# Patient Record
Sex: Female | Born: 1935 | Race: White | Hispanic: No | Smoking: Never smoker
Health system: Southern US, Community
[De-identification: ages and names within clinical notes are randomized; demographics above are authoritative.]

## PROBLEM LIST (undated history)

## (undated) DIAGNOSIS — F32A Depression, unspecified: Secondary | ICD-10-CM

## (undated) DIAGNOSIS — C55 Malignant neoplasm of uterus, part unspecified: Secondary | ICD-10-CM

## (undated) DIAGNOSIS — R197 Diarrhea, unspecified: Secondary | ICD-10-CM

## (undated) DIAGNOSIS — N183 Chronic kidney disease, stage 3 unspecified: Secondary | ICD-10-CM

## (undated) DIAGNOSIS — E669 Obesity, unspecified: Secondary | ICD-10-CM

## (undated) DIAGNOSIS — I10 Essential (primary) hypertension: Secondary | ICD-10-CM

## (undated) DIAGNOSIS — I639 Cerebral infarction, unspecified: Secondary | ICD-10-CM

## (undated) DIAGNOSIS — F329 Major depressive disorder, single episode, unspecified: Secondary | ICD-10-CM

## (undated) DIAGNOSIS — K222 Esophageal obstruction: Secondary | ICD-10-CM

## (undated) DIAGNOSIS — K219 Gastro-esophageal reflux disease without esophagitis: Secondary | ICD-10-CM

## (undated) DIAGNOSIS — R011 Cardiac murmur, unspecified: Secondary | ICD-10-CM

## (undated) DIAGNOSIS — Z90710 Acquired absence of both cervix and uterus: Secondary | ICD-10-CM

## (undated) DIAGNOSIS — E039 Hypothyroidism, unspecified: Secondary | ICD-10-CM

## (undated) DIAGNOSIS — K2970 Gastritis, unspecified, without bleeding: Secondary | ICD-10-CM

## (undated) DIAGNOSIS — E785 Hyperlipidemia, unspecified: Secondary | ICD-10-CM

## (undated) DIAGNOSIS — I2510 Atherosclerotic heart disease of native coronary artery without angina pectoris: Secondary | ICD-10-CM

## (undated) HISTORY — DX: Hyperlipidemia, unspecified: E78.5

## (undated) HISTORY — DX: Cardiac murmur, unspecified: R01.1

## (undated) HISTORY — DX: Hypothyroidism, unspecified: E03.9

## (undated) HISTORY — DX: Diarrhea, unspecified: R19.7

## (undated) HISTORY — PX: CERVICAL DISC SURGERY: SHX588

## (undated) HISTORY — DX: Obesity, unspecified: E66.9

## (undated) HISTORY — DX: Esophageal obstruction: K22.2

## (undated) HISTORY — DX: Acquired absence of both cervix and uterus: Z90.710

## (undated) HISTORY — DX: Depression, unspecified: F32.A

## (undated) HISTORY — DX: Atherosclerotic heart disease of native coronary artery without angina pectoris: I25.10

## (undated) HISTORY — DX: Malignant neoplasm of uterus, part unspecified: C55

## (undated) HISTORY — DX: Essential (primary) hypertension: I10

## (undated) HISTORY — DX: Cerebral infarction, unspecified: I63.9

## (undated) HISTORY — PX: TOTAL ABDOMINAL HYSTERECTOMY: SHX209

## (undated) HISTORY — DX: Gastro-esophageal reflux disease without esophagitis: K21.9

## (undated) HISTORY — DX: Major depressive disorder, single episode, unspecified: F32.9

## (undated) HISTORY — PX: CHOLECYSTECTOMY: SHX55

## (undated) HISTORY — DX: Gastritis, unspecified, without bleeding: K29.70

## (undated) HISTORY — PX: BLADDER SURGERY: SHX569

---

## 1997-06-30 ENCOUNTER — Encounter: Payer: Self-pay | Admitting: Gastroenterology

## 1998-03-26 ENCOUNTER — Other Ambulatory Visit: Admission: RE | Admit: 1998-03-26 | Discharge: 1998-03-26 | Payer: Self-pay | Admitting: *Deleted

## 1998-05-18 ENCOUNTER — Ambulatory Visit (HOSPITAL_COMMUNITY): Admission: RE | Admit: 1998-05-18 | Discharge: 1998-05-18 | Payer: Self-pay | Admitting: *Deleted

## 1998-11-29 ENCOUNTER — Other Ambulatory Visit: Admission: RE | Admit: 1998-11-29 | Discharge: 1998-11-29 | Payer: Self-pay | Admitting: *Deleted

## 1999-01-20 ENCOUNTER — Observation Stay (HOSPITAL_COMMUNITY): Admission: RE | Admit: 1999-01-20 | Discharge: 1999-01-21 | Payer: Self-pay | Admitting: Urology

## 1999-03-31 ENCOUNTER — Other Ambulatory Visit: Admission: RE | Admit: 1999-03-31 | Discharge: 1999-03-31 | Payer: Self-pay | Admitting: *Deleted

## 1999-06-02 ENCOUNTER — Ambulatory Visit (HOSPITAL_COMMUNITY): Admission: RE | Admit: 1999-06-02 | Discharge: 1999-06-02 | Payer: Self-pay | Admitting: *Deleted

## 1999-06-06 ENCOUNTER — Encounter: Admission: RE | Admit: 1999-06-06 | Discharge: 1999-06-06 | Payer: Self-pay | Admitting: *Deleted

## 1999-10-20 ENCOUNTER — Other Ambulatory Visit: Admission: RE | Admit: 1999-10-20 | Discharge: 1999-10-20 | Payer: Self-pay | Admitting: *Deleted

## 2000-04-04 ENCOUNTER — Ambulatory Visit (HOSPITAL_COMMUNITY): Admission: RE | Admit: 2000-04-04 | Discharge: 2000-04-04 | Payer: Self-pay | Admitting: Ophthalmology

## 2000-05-24 ENCOUNTER — Other Ambulatory Visit: Admission: RE | Admit: 2000-05-24 | Discharge: 2000-05-24 | Payer: Self-pay | Admitting: *Deleted

## 2000-06-05 ENCOUNTER — Encounter: Admission: RE | Admit: 2000-06-05 | Discharge: 2000-06-05 | Payer: Self-pay | Admitting: *Deleted

## 2000-11-30 ENCOUNTER — Other Ambulatory Visit: Admission: RE | Admit: 2000-11-30 | Discharge: 2000-11-30 | Payer: Self-pay | Admitting: *Deleted

## 2001-05-23 ENCOUNTER — Inpatient Hospital Stay (HOSPITAL_COMMUNITY): Admission: EM | Admit: 2001-05-23 | Discharge: 2001-05-29 | Payer: Self-pay | Admitting: Emergency Medicine

## 2001-05-23 ENCOUNTER — Encounter: Payer: Self-pay | Admitting: Emergency Medicine

## 2001-05-26 ENCOUNTER — Encounter: Payer: Self-pay | Admitting: Cardiology

## 2001-05-27 ENCOUNTER — Encounter: Payer: Self-pay | Admitting: Cardiology

## 2001-07-01 ENCOUNTER — Encounter: Admission: RE | Admit: 2001-07-01 | Discharge: 2001-09-29 | Payer: Self-pay | Admitting: Cardiology

## 2001-07-02 ENCOUNTER — Encounter: Admission: RE | Admit: 2001-07-02 | Discharge: 2001-07-02 | Payer: Self-pay | Admitting: *Deleted

## 2001-07-02 ENCOUNTER — Other Ambulatory Visit: Admission: RE | Admit: 2001-07-02 | Discharge: 2001-07-02 | Payer: Self-pay | Admitting: *Deleted

## 2001-07-24 ENCOUNTER — Ambulatory Visit (HOSPITAL_COMMUNITY): Admission: RE | Admit: 2001-07-24 | Discharge: 2001-07-24 | Payer: Self-pay | Admitting: Gastroenterology

## 2001-08-30 ENCOUNTER — Encounter: Admission: RE | Admit: 2001-08-30 | Discharge: 2001-08-30 | Payer: Self-pay | Admitting: Gastroenterology

## 2001-08-30 ENCOUNTER — Encounter: Payer: Self-pay | Admitting: Gastroenterology

## 2001-11-14 ENCOUNTER — Encounter: Admission: RE | Admit: 2001-11-14 | Discharge: 2002-02-12 | Payer: Self-pay | Admitting: Cardiology

## 2002-01-21 ENCOUNTER — Other Ambulatory Visit: Admission: RE | Admit: 2002-01-21 | Discharge: 2002-01-21 | Payer: Self-pay | Admitting: *Deleted

## 2002-07-09 ENCOUNTER — Encounter: Admission: RE | Admit: 2002-07-09 | Discharge: 2002-07-09 | Payer: Self-pay | Admitting: *Deleted

## 2003-07-29 ENCOUNTER — Encounter: Admission: RE | Admit: 2003-07-29 | Discharge: 2003-07-29 | Payer: Self-pay | Admitting: *Deleted

## 2003-10-06 ENCOUNTER — Inpatient Hospital Stay (HOSPITAL_BASED_OUTPATIENT_CLINIC_OR_DEPARTMENT_OTHER): Admission: RE | Admit: 2003-10-06 | Discharge: 2003-10-06 | Payer: Self-pay | Admitting: *Deleted

## 2003-10-22 ENCOUNTER — Other Ambulatory Visit: Admission: RE | Admit: 2003-10-22 | Discharge: 2003-10-22 | Payer: Self-pay | Admitting: *Deleted

## 2004-06-23 ENCOUNTER — Encounter: Admission: RE | Admit: 2004-06-23 | Discharge: 2004-06-23 | Payer: Self-pay | Admitting: Internal Medicine

## 2005-06-16 ENCOUNTER — Ambulatory Visit: Payer: Self-pay | Admitting: Cardiology

## 2005-10-09 ENCOUNTER — Ambulatory Visit (HOSPITAL_COMMUNITY): Admission: RE | Admit: 2005-10-09 | Discharge: 2005-10-09 | Payer: Self-pay | Admitting: Internal Medicine

## 2006-04-24 ENCOUNTER — Ambulatory Visit: Payer: Self-pay | Admitting: Cardiovascular Disease

## 2006-04-26 ENCOUNTER — Ambulatory Visit: Payer: Self-pay

## 2007-05-24 ENCOUNTER — Ambulatory Visit: Payer: Self-pay

## 2007-08-06 ENCOUNTER — Ambulatory Visit: Payer: Self-pay | Admitting: Cardiovascular Disease

## 2008-06-04 ENCOUNTER — Ambulatory Visit: Payer: Self-pay | Admitting: Cardiovascular Disease

## 2008-06-09 ENCOUNTER — Ambulatory Visit (HOSPITAL_COMMUNITY): Admission: RE | Admit: 2008-06-09 | Discharge: 2008-06-09 | Payer: Self-pay | Admitting: Internal Medicine

## 2008-08-06 ENCOUNTER — Ambulatory Visit: Payer: Self-pay | Admitting: Cardiovascular Disease

## 2009-05-03 ENCOUNTER — Ambulatory Visit: Payer: Self-pay | Admitting: Gastroenterology

## 2009-05-03 ENCOUNTER — Encounter (INDEPENDENT_AMBULATORY_CARE_PROVIDER_SITE_OTHER): Payer: Self-pay | Admitting: *Deleted

## 2009-05-03 DIAGNOSIS — I6350 Cerebral infarction due to unspecified occlusion or stenosis of unspecified cerebral artery: Secondary | ICD-10-CM | POA: Insufficient documentation

## 2009-05-03 DIAGNOSIS — R1310 Dysphagia, unspecified: Secondary | ICD-10-CM | POA: Insufficient documentation

## 2009-05-03 DIAGNOSIS — K219 Gastro-esophageal reflux disease without esophagitis: Secondary | ICD-10-CM | POA: Insufficient documentation

## 2009-05-03 DIAGNOSIS — I2510 Atherosclerotic heart disease of native coronary artery without angina pectoris: Secondary | ICD-10-CM

## 2009-05-24 ENCOUNTER — Ambulatory Visit: Payer: Self-pay | Admitting: Gastroenterology

## 2009-05-24 ENCOUNTER — Encounter: Payer: Self-pay | Admitting: Gastroenterology

## 2009-05-26 ENCOUNTER — Encounter: Payer: Self-pay | Admitting: Gastroenterology

## 2009-05-27 ENCOUNTER — Encounter: Payer: Self-pay | Admitting: Gastroenterology

## 2009-06-21 ENCOUNTER — Ambulatory Visit: Payer: Self-pay | Admitting: Gastroenterology

## 2009-07-05 ENCOUNTER — Telehealth: Payer: Self-pay | Admitting: Gastroenterology

## 2009-07-06 ENCOUNTER — Ambulatory Visit: Payer: Self-pay | Admitting: Gastroenterology

## 2009-07-06 DIAGNOSIS — R112 Nausea with vomiting, unspecified: Secondary | ICD-10-CM | POA: Insufficient documentation

## 2009-07-06 DIAGNOSIS — R110 Nausea: Secondary | ICD-10-CM

## 2009-07-06 DIAGNOSIS — K2970 Gastritis, unspecified, without bleeding: Secondary | ICD-10-CM | POA: Insufficient documentation

## 2009-07-06 DIAGNOSIS — R197 Diarrhea, unspecified: Secondary | ICD-10-CM | POA: Insufficient documentation

## 2009-07-06 DIAGNOSIS — K2990 Gastroduodenitis, unspecified, without bleeding: Secondary | ICD-10-CM

## 2009-07-06 DIAGNOSIS — R1084 Generalized abdominal pain: Secondary | ICD-10-CM | POA: Insufficient documentation

## 2009-07-06 DIAGNOSIS — I10 Essential (primary) hypertension: Secondary | ICD-10-CM

## 2009-07-06 DIAGNOSIS — E039 Hypothyroidism, unspecified: Secondary | ICD-10-CM | POA: Insufficient documentation

## 2009-07-06 DIAGNOSIS — K222 Esophageal obstruction: Secondary | ICD-10-CM | POA: Insufficient documentation

## 2009-07-08 LAB — CONVERTED CEMR LAB
BUN: 15 mg/dL (ref 6–23)
Basophils Absolute: 0 10*3/uL (ref 0.0–0.1)
Basophils Relative: 0.4 % (ref 0.0–3.0)
CO2: 26 meq/L (ref 19–32)
Calcium: 8.5 mg/dL (ref 8.4–10.5)
Chloride: 103 meq/L (ref 96–112)
Creatinine, Ser: 1.1 mg/dL (ref 0.4–1.2)
Eosinophils Absolute: 0.3 10*3/uL (ref 0.0–0.7)
Eosinophils Relative: 2.3 % (ref 0.0–5.0)
GFR calc non Af Amer: 50.88 mL/min (ref >60)
Glucose, Bld: 234 mg/dL — ABNORMAL HIGH (ref 70–99)
HCT: 33.5 % — ABNORMAL LOW (ref 36.0–46.0)
Hemoglobin: 11.2 g/dL — ABNORMAL LOW (ref 12.0–15.0)
Lymphocytes Relative: 22.2 % (ref 12.0–46.0)
Lymphs Abs: 2.4 10*3/uL (ref 0.7–4.0)
MCHC: 33.5 g/dL (ref 30.0–36.0)
MCV: 90.8 fL (ref 78.0–100.0)
Monocytes Absolute: 1 10*3/uL (ref 0.1–1.0)
Monocytes Relative: 9.4 % (ref 3.0–12.0)
Neutro Abs: 7.3 10*3/uL (ref 1.4–7.7)
Neutrophils Relative %: 65.7 % (ref 43.0–77.0)
Platelets: 292 10*3/uL (ref 150.0–400.0)
Potassium: 3.6 meq/L (ref 3.5–5.1)
RBC: 3.69 M/uL — ABNORMAL LOW (ref 3.87–5.11)
RDW: 14.3 % (ref 11.5–14.6)
Sodium: 139 meq/L (ref 135–145)
WBC: 11 10*3/uL — ABNORMAL HIGH (ref 4.5–10.5)

## 2009-07-28 DIAGNOSIS — E669 Obesity, unspecified: Secondary | ICD-10-CM | POA: Insufficient documentation

## 2009-07-28 DIAGNOSIS — F329 Major depressive disorder, single episode, unspecified: Secondary | ICD-10-CM | POA: Insufficient documentation

## 2009-07-28 DIAGNOSIS — E119 Type 2 diabetes mellitus without complications: Secondary | ICD-10-CM | POA: Insufficient documentation

## 2009-07-28 DIAGNOSIS — Z8542 Personal history of malignant neoplasm of other parts of uterus: Secondary | ICD-10-CM

## 2009-07-28 DIAGNOSIS — E785 Hyperlipidemia, unspecified: Secondary | ICD-10-CM | POA: Insufficient documentation

## 2009-08-03 ENCOUNTER — Ambulatory Visit: Payer: Self-pay | Admitting: Cardiovascular Disease

## 2009-10-07 ENCOUNTER — Emergency Department (HOSPITAL_COMMUNITY): Admission: EM | Admit: 2009-10-07 | Discharge: 2009-10-07 | Payer: Self-pay | Admitting: Emergency Medicine

## 2009-10-10 ENCOUNTER — Encounter: Admission: RE | Admit: 2009-10-10 | Discharge: 2009-10-10 | Payer: Self-pay | Admitting: Internal Medicine

## 2009-10-11 ENCOUNTER — Encounter: Admission: RE | Admit: 2009-10-11 | Discharge: 2009-10-11 | Payer: Self-pay | Admitting: Internal Medicine

## 2009-10-13 ENCOUNTER — Ambulatory Visit (HOSPITAL_COMMUNITY): Admission: RE | Admit: 2009-10-13 | Discharge: 2009-10-13 | Payer: Self-pay | Admitting: Gastroenterology

## 2009-11-23 ENCOUNTER — Telehealth (INDEPENDENT_AMBULATORY_CARE_PROVIDER_SITE_OTHER): Payer: Self-pay | Admitting: *Deleted

## 2010-05-27 ENCOUNTER — Ambulatory Visit: Payer: Self-pay | Admitting: Internal Medicine

## 2010-05-28 ENCOUNTER — Observation Stay (HOSPITAL_COMMUNITY): Admission: EM | Admit: 2010-05-28 | Discharge: 2010-05-29 | Payer: Self-pay | Admitting: Emergency Medicine

## 2010-06-14 ENCOUNTER — Encounter: Payer: Self-pay | Admitting: Cardiovascular Disease

## 2010-06-14 DIAGNOSIS — I6529 Occlusion and stenosis of unspecified carotid artery: Secondary | ICD-10-CM

## 2010-06-15 ENCOUNTER — Encounter: Payer: Self-pay | Admitting: Cardiovascular Disease

## 2010-06-15 ENCOUNTER — Encounter: Payer: Self-pay | Admitting: Physician Assistant

## 2010-06-15 ENCOUNTER — Ambulatory Visit: Payer: Self-pay | Admitting: Internal Medicine

## 2010-06-15 ENCOUNTER — Ambulatory Visit: Payer: Self-pay

## 2010-06-15 DIAGNOSIS — J984 Other disorders of lung: Secondary | ICD-10-CM

## 2010-06-15 DIAGNOSIS — R079 Chest pain, unspecified: Secondary | ICD-10-CM

## 2010-06-27 ENCOUNTER — Telehealth (INDEPENDENT_AMBULATORY_CARE_PROVIDER_SITE_OTHER): Payer: Self-pay

## 2010-06-28 ENCOUNTER — Encounter (HOSPITAL_COMMUNITY)
Admission: RE | Admit: 2010-06-28 | Discharge: 2010-08-20 | Payer: Self-pay | Source: Home / Self Care | Attending: Cardiovascular Disease | Admitting: Cardiovascular Disease

## 2010-06-28 ENCOUNTER — Ambulatory Visit: Payer: Self-pay

## 2010-06-28 ENCOUNTER — Encounter: Payer: Self-pay | Admitting: Cardiology

## 2010-06-28 ENCOUNTER — Ambulatory Visit: Payer: Self-pay | Admitting: Cardiology

## 2010-07-11 ENCOUNTER — Ambulatory Visit: Payer: Self-pay | Admitting: Cardiovascular Disease

## 2010-07-26 ENCOUNTER — Ambulatory Visit: Payer: Self-pay | Admitting: Thoracic Surgery

## 2010-09-11 ENCOUNTER — Encounter: Payer: Self-pay | Admitting: Internal Medicine

## 2010-09-20 NOTE — Progress Notes (Signed)
Summary: Nuc. Pre-Procedure  Phone Note Outgoing Call Call back at [facility] Phone [phone]   Call placed by: Irean Hong, RN,  June 27, 2010 2:22 PM Summary of Call: Reviewed information on Myoview Information Sheet (see scanned document for further details).  Spoke with patient.     Nuclear Med Background Indications for Stress Test: Evaluation for Ischemia, Post Hospital  Indications Comments: [facility] discharged 05/29/10: CP, (-) enzymes.  History: Heart Catheterization, Myocardial Infarction, Myocardial Perfusion Study  History Comments: '02 MI. '05 Cath: Moderate 3V disease, EF NL; TX med. '07 MPS: (-) ischemia,EF=74%.  Symptoms: Chest Pressure    Nuclear Pre-Procedure Cardiac Risk Factors: CVA, Family History - CAD, Hypertension, Lipids, NIDDM Height (in): 58

## 2010-09-20 NOTE — Progress Notes (Signed)
  Faxed all Cardiac over to [facility] to fax [phone] [facility]  November 23, 2009 1:16 PM

## 2010-09-20 NOTE — Assessment & Plan Note (Signed)
Summary: ROV   Visit Type:  Follow-up Primary Provider:  Pearson Grippe, MD  CC:  chest pain.  History of Present Illness: 77 year-old woman presenting for followup after a recent hospitalization for chest pain. She has a history of moderate 3 vessel CAD treated medically for several years without incident. She was seen in followup by Herma Carson who ordered a Myoview stress test. This was reviewed today and the results were normal with no ischemia.  The patient is doing well. She has had no further chest pain. She reports stable exertional dyspnea, but no edema, orthopnea, or PND. No palps or other complaints at this time.  Current Medications (verified): 1)  Norvasc 10 Mg Tabs (Amlodipine Besylate) .... Once Daily 2)  Zoloft 50 Mg Tabs (Sertraline Hcl) .... Take 1/2 Tablet Daily 3)  Toprol Xl 100 Mg Xr24h-Tab (Metoprolol Succinate) .Marland Kitchen.. 1 and 1/2  By Mouth Once Daily 4)  Aspirin 325 Mg Tabs (Aspirin) .Marland Kitchen.. 1 By Mouth Once Daily 5)  Ocuvite  Tabs (Multiple Vitamins-Minerals) .Marland Kitchen.. 1 By Mouth Once Daily 6)  Synthroid 75 Mcg Tabs (Levothyroxine Sodium) .Marland Kitchen.. 1 By Mouth Once Daily 7)  Isosorbide Dinitrate 20 Mg Tabs (Isosorbide Dinitrate) .... Take Two Tablets Twice A Day 8)  Foltx 2.5-25-2 Mg Tabs (Fa-Pyridoxine-Cyancobalamin) .Marland Kitchen.. 1 By Mouth Once Daily 9)  Glucovance 5-500 Mg Tabs (Glyburide-Metformin) .... Take 1 Tablet Three Times A Day 10)  Lasix 20 Mg Tabs (Furosemide) .... Once Daily 11)  Lotensin 20 Mg Tabs (Benazepril Hcl) .... Take Once A Day 12)  Crestor 10 Mg Tabs (Rosuvastatin Calcium) .... Take One Daily 13)  Pantoprazole Sodium 40 Mg Tbec (Pantoprazole Sodium) .... Take One Daily 14)  Hydrocodone-Acetaminophen 5-500 Mg Tabs (Hydrocodone-Acetaminophen) .... Take One Every 6 Hours As Needed  Allergies: 1)  ! * Tetanus 2)  ! * Anaspaz 3)  ! Adhesive Tape  Past History:  Past medical history reviewed for relevance to current acute and chronic problems.  Past Medical  History: Reviewed history from 07/28/2009 and no changes required. Current Problems:  CORONARY ARTERY DISEASE (ICD-414.00) HYPERTENSION (ICD-401.9) HYPERLIPIDEMIA (ICD-272.4) Hx of CVA (ICD-434.91) OBESITY (ICD-278.00) DIABETES MELLITUS, TYPE II (ICD-250.00) HYPOTHYROIDISM (ICD-244.9) ESOPHAGEAL STRICTURE (ICD-530.3) GASTRITIS (ICD-535.50) NAUSEA AND VOMITING (ICD-787.01) DIARRHEA (ICD-787.91) ABDOMINAL PAIN, GENERALIZED (ICD-789.07) NAUSEA ALONE (ICD-787.02) SPECIAL SCREENING FOR MALIGNANT NEOPLASMS COLON (ICD-V76.51) DYSPHAGIA UNSPECIFIED (ICD-787.20) ESOPHAGEAL REFLUX (ICD-530.81) DYSPHAGIA UNSPECIFIED (ICD-787.20) DEPRESSION (ICD-311) UTERINE CANCER, HX OF (ICD-V10.42)  Review of Systems       Negative except as per HPI   Vital Signs:  Patient profile:   77 year old female Height:      58 inches Weight:      145.50 pounds BMI:     30.52 Pulse rate:   64 / minute Pulse rhythm:   regular Resp:     18 per minute BP sitting:   128 / 60  (left arm) Cuff size:   large  Vitals Entered By: Vikki Ports (July 11, 2010 4:16 PM)  Physical Exam  General:  Pt is alert and oriented, elderly woman in no acute distress. HEENT: normal Neck: normal carotid upstrokes without bruits, JVP normal Lungs: CTA CV: RRR without murmur or gallop Abd: soft, NT, positive BS, no bruit, no organomegaly Ext: no clubbing, cyanosis, or edema. peripheral pulses 2+ and equal Skin: warm and dry without rash    Nuclear Study  Procedure date:  06/28/2010  Findings:      QPS  Raw Data Images:  Normal; no motion artifact; normal  heart/lung ratio. Stress Images:  Normal homogeneous uptake in all areas of the myocardium. Rest Images:  Normal homogeneous uptake in all areas of the myocardium. Subtraction (SDS):  No evidence of ischemia. Transient Ischemic Dilatation:  0.97  (Normal <1.22)  Lung/Heart Ratio:  0.28  (Normal <0.45)  Quantitative Gated Spect Images  QGS EDV:  61 ml QGS  ESV:  15 ml QGS EF:  75 % QGS cine images:  Normal motion  Findings  Normal nuclear study  Impression & Recommendations:  Problem # 1:  CORONARY ARTERY DISEASE (ICD-414.00) Normal nuclear stress test is very reassuring and portends a good prognosis over the next 5 years. Recommend continued medical management of known moderate CAD as outlined below.  Her updated medication list for this problem includes:    Norvasc 10 Mg Tabs (Amlodipine besylate) ..... Once daily    Toprol Xl 100 Mg Xr24h-tab (Metoprolol succinate) .Marland Kitchen... 1 and 1/2  by mouth once daily    Aspirin 325 Mg Tabs (Aspirin) .Marland Kitchen... 1 by mouth once daily    Isosorbide Dinitrate 20 Mg Tabs (Isosorbide dinitrate) .Marland Kitchen... Take two tablets twice a day    Lotensin 20 Mg Tabs (Benazepril hcl) .Marland Kitchen... Take once a day  Problem # 2:  HYPERTENSION (ICD-401.9) BP controlled.  Her updated medication list for this problem includes:    Norvasc 10 Mg Tabs (Amlodipine besylate) ..... Once daily    Toprol Xl 100 Mg Xr24h-tab (Metoprolol succinate) .Marland Kitchen... 1 and 1/2  by mouth once daily    Aspirin 325 Mg Tabs (Aspirin) .Marland Kitchen... 1 by mouth once daily    Lasix 20 Mg Tabs (Furosemide) ..... Once daily    Lotensin 20 Mg Tabs (Benazepril hcl) .Marland Kitchen... Take once a day  BP today: 128/60 Prior BP: 145/63 (06/15/2010)  Labs Reviewed: K+: 3.6 (07/06/2009) Creat: : 1.1 (07/06/2009)     Problem # 3:  HYPERLIPIDEMIA (ICD-272.4) Pt followed by Dr Selena Batten. Appropriately on statin Rx with goal LDL less than 100 mg/dL  Her updated medication list for this problem includes:    Crestor 10 Mg Tabs (Rosuvastatin calcium) .Marland Kitchen... Take one daily  Problem # 4:  CAROTID ARTERY DISEASE (ICD-433.10) 06/15/2010 duplex scan reviewed: less than 40% RICA stenosis and 40-69% LICA stenosis noted. Continue medical management in this asymptomatic patient.  Her updated medication list for this problem includes:    Aspirin 325 Mg Tabs (Aspirin) .Marland Kitchen... 1 by mouth once daily  Patient  Instructions: 1)  Your physician recommends that you continue on your current medications as directed. Please refer to the Current Medication list given to you today. 2)  Your physician wants you to follow-up in: 6 MONTHS.   You will receive a reminder letter in the mail two months in advance. If you don't receive a letter, please call our office to schedule the follow-up appointment.

## 2010-09-20 NOTE — Miscellaneous (Signed)
Summary: Orders Update  Clinical Lists Changes  Problems: Added new problem of CAROTID ARTERY DISEASE (ICD-433.10) Orders: Added new Test order of Carotid Duplex (Carotid Duplex) - Signed 

## 2010-09-20 NOTE — Assessment & Plan Note (Signed)
Summary: eph /2-3 week ck/mt   Visit Type:  Follow-up Primary Provider:  Pearson Grippe, MD  CC:  dizzy once in awhile.  History of Present Illness: This is an 77 year old white female patient who was recently admitted to the hospital with recurrent chest pain. She ruled out for an MI and was sent back for regular followup. While in the hospital she had CT scan to rule out pulmonary embolus but was found to have a 5 mm right lower lobe lobe lung nodule. Recommend followup in 6-12 months if she is at high risk for bronchogenic carcinoma in 12 months if she is low risk.  The patient does have a history of coronary artery disease status post non-ST segment elevation M She has not had a Myoview scan since 2007.  The patient describes her chest pain as a tightness that was off-and-on for about 2 weeks it was nonexertional related and would occur just sitting down. It lasts a few minutes and eased off. On Friday that she went to the hospital she had 2-3 hours of constant chest tightness she tried 2 nitroglycerin without relief. She has had very little chest tightness and she's been discharged from the hospital. She says she walks a slow pace for half an hour a day before all this started and had dyspnea on exertion. She denied chest pain with her walk.  Current Medications (verified): 1)  Norvasc 10 Mg Tabs (Amlodipine Besylate) .... Once Daily 2)  Zoloft 50 Mg Tabs (Sertraline Hcl) .... Take 1/2 Tablet Daily 3)  Toprol Xl 100 Mg Xr24h-Tab (Metoprolol Succinate) .Marland Kitchen.. 1 and 1/2  By Mouth Once Daily 4)  Aspirin 325 Mg Tabs (Aspirin) .Marland Kitchen.. 1 By Mouth Once Daily 5)  Ocuvite  Tabs (Multiple Vitamins-Minerals) .Marland Kitchen.. 1 By Mouth Once Daily 6)  Synthroid 75 Mcg Tabs (Levothyroxine Sodium) .Marland Kitchen.. 1 By Mouth Once Daily 7)  Isosorbide Dinitrate 20 Mg Tabs (Isosorbide Dinitrate) .... Take Two Tablets Twice A Day 8)  Foltx 2.5-25-2 Mg Tabs (Fa-Pyridoxine-Cyancobalamin) .Marland Kitchen.. 1 By Mouth Once Daily 9)  Glucovance 5-500 Mg  Tabs (Glyburide-Metformin) .... Take 1 Tablet Three Times A Day 10)  Lasix 20 Mg Tabs (Furosemide) .... Once Daily 11)  Lotensin 20 Mg Tabs (Benazepril Hcl) .... Take Once A Day 12)  Crestor 10 Mg Tabs (Rosuvastatin Calcium) .... Take One Daily 13)  Pantoprazole Sodium 40 Mg Tbec (Pantoprazole Sodium) .... Take One Daily 14)  Hydrocodone-Acetaminophen 5-500 Mg Tabs (Hydrocodone-Acetaminophen) .... Take One Every 6 Hours As Needed  Allergies: 1)  ! * Tetanus 2)  ! * Anaspaz 3)  ! Adhesive Tape  Past History:  Past Medical History: Last updated: 07/28/2009 Current Problems:  CORONARY ARTERY DISEASE (ICD-414.00) HYPERTENSION (ICD-401.9) HYPERLIPIDEMIA (ICD-272.4) Hx of CVA (ICD-434.91) OBESITY (ICD-278.00) DIABETES MELLITUS, TYPE II (ICD-250.00) HYPOTHYROIDISM (ICD-244.9) ESOPHAGEAL STRICTURE (ICD-530.3) GASTRITIS (ICD-535.50) NAUSEA AND VOMITING (ICD-787.01) DIARRHEA (ICD-787.91) ABDOMINAL PAIN, GENERALIZED (ICD-789.07) NAUSEA ALONE (ICD-787.02) SPECIAL SCREENING FOR MALIGNANT NEOPLASMS COLON (ICD-V76.51) DYSPHAGIA UNSPECIFIED (ICD-787.20) ESOPHAGEAL REFLUX (ICD-530.81) DYSPHAGIA UNSPECIFIED (ICD-787.20) DEPRESSION (ICD-311) UTERINE CANCER, HX OF (ICD-V10.42)  Family History: Reviewed history from 07/28/2009 and no changes required.  Notable in that her mother suffered from a myocardial infarction from which he passed away at age 43. Her father did not havecoronary artery disease but she has 1 brother who is alive that has beendiagnosed with coronary artery disease.  Social History: Reviewed history from 07/28/2009 and no changes required.  The patient lives alone and is retired. She denies any history of tobacco use or  significant alcohol consumption.  Review of Systems       see history of present illness  Vital Signs:  Patient profile:   77 year old female Height:      58 inches Weight:      148 pounds BMI:     31.04 Pulse rate:   64 / minute Pulse rhythm:    regular BP sitting:   145 / 63  (right arm)  Vitals Entered By: Jacquelin Hawking, CMA (June 15, 2010 12:57 PM)  Physical Exam  General:   Well-nournished, in no acute distress. Neck: No JVD, HJR, Bruit, or thyroid enlargement Lungs: No tachypnea, clear without wheezing, rales, or rhonchi Cardiovascular: RRR, PMI not displaced, heart sounds normal, no murmurs, gallops, bruit, thrill, or heave. Abdomen: BS normal. Soft without organomegaly, masses, lesions or tenderness. Extremities: without cyanosis, clubbing or edema. Good distal pulses bilateral SKin: Warm, no lesions or rashes  Musculoskeletal: No deformities Neuro: no focal signs    EKG  Procedure date:  06/15/2010  Findings:      normal sinus rhythm with occasional PAC  Impression & Recommendations:  Problem # 1:  CHEST PAIN UNSPECIFIED (ICD-786.50) Patient had an admission with recurrent chest pain that ruled out for an MI. With her history of coronary artery disease and cardiac risk factors we will order an adenosine Myoview to rule out ischemia.  Her updated medication list for this problem includes:    Norvasc 10 Mg Tabs (Amlodipine besylate) ..... Once daily    Toprol Xl 100 Mg Xr24h-tab (Metoprolol succinate) .Marland Kitchen... 1 and 1/2  by mouth once daily    Aspirin 325 Mg Tabs (Aspirin) .Marland Kitchen... 1 by mouth once daily    Isosorbide Dinitrate 20 Mg Tabs (Isosorbide dinitrate) .Marland Kitchen... Take two tablets twice a day    Lotensin 20 Mg Tabs (Benazepril hcl) .Marland Kitchen... Take once a day  Orders: Nuclear Stress Test (Nuc Stress Test)  Problem # 2:  HYPERTENSION (ICD-401.9) Blood pressure is on the high side today. She said she had it checked yesterday and it was 120/60. Where she lives she can get it checked twice a week. I've asked her to do this and keep a log and call us. Her updated medication list for this problem includes:    Norvasc 10 Mg Tabs (Amlodipine besylate) ..... Once daily    Toprol Xl 100 Mg Xr24h-tab (Metoprolol succinate)  .Marland Kitchen... 1 and 1/2  by mouth once daily    Aspirin 325 Mg Tabs (Aspirin) .Marland Kitchen... 1 by mouth once daily    Lasix 20 Mg Tabs (Furosemide) ..... Once daily    Lotensin 20 Mg Tabs (Benazepril hcl) .Marland Kitchen... Take once a day  Problem # 3:  OBESITY (ICD-278.00) Patient needs to lose weight  Problem # 4:  HYPERLIPIDEMIA (ICD-272.4) Treated Her updated medication list for this problem includes:    Crestor 10 Mg Tabs (Rosuvastatin calcium) .Marland Kitchen... Take one daily  Problem # 5:  CORONARY ARTERY DISEASE (ICD-414.00) Patient has a history of coronary artery disease status post MI in 2002. Last Myoview was in 2007 which was negative for ischemia. We will repeat an adenosine Myoview to rule out ischemia Her updated medication list for this problem includes:    Norvasc 10 Mg Tabs (Amlodipine besylate) ..... Once daily    Toprol Xl 100 Mg Xr24h-tab (Metoprolol succinate) .Marland Kitchen... 1 and 1/2  by mouth once daily    Aspirin 325 Mg Tabs (Aspirin) .Marland Kitchen... 1 by mouth once daily    Isosorbide Dinitrate 20 Mg  Tabs (Isosorbide dinitrate) .Marland Kitchen... Take two tablets twice a day    Lotensin 20 Mg Tabs (Benazepril hcl) .Marland Kitchen... Take once a day  Problem # 6:  PULMONARY NODULE (ICD-518.89) Patient was found to have a 5 mm right lower lobe pulmonary nodule on CT scan May 28, 2010. The patient did live with her husband who was a heavy smoker a stepfather who smoked and her son who smoked. She never smoked herself. She needs followup CT scan in 6-12 months. I asked her to schedule this through her primary care physician Dr. Pearson Grippe.  Patient Instructions: 1)  Your physician has requested that you have an adenosine myoview.  For further information please visit [email or web address]  Please follow instruction sheet, as given. 2)  Check your blood pressure 2 times a week after you take your medication. 3)  You should have a chest  CT in 6 months to follow-up on pulmonary nodules found on chest CT done 05/27/10. Your primary care doctor can make  arrangements for this for you. 4)  Your physician recommends that you schedule a follow-up appointment in: 1 month with Dr Excell Seltzer.

## 2010-09-20 NOTE — Assessment & Plan Note (Signed)
Summary: Cardiology Nuclear Testing  Nuclear Med Background Indications for Stress Test: Evaluation for Ischemia, Post Hospital  Indications Comments: 05/29/10 Chest Pain, (-) enzymes.  History: Heart Catheterization, Myocardial Infarction, Myocardial Perfusion Study  History Comments: '05 Cath: Moderate 3V disease, medical tx.; '07 MPS:no ischemia, EF=74%.  Symptoms: Chest Pressure, DOE, Fatigue, Palpitations, Rapid HR  Symptoms Comments: Last episode of CP:3 weeks ago.   Nuclear Pre-Procedure Cardiac Risk Factors: CVA, Family History - CAD, Hypertension, Lipids, NIDDM Caffeine/Decaff Intake: None NPO After: 10:00 PM Lungs: Clear.  O2 Sat 97% on RA. IV 0.9% NS with Angio Cath: 22g     IV Site: R Forearm IV Started by: Irean Hong, RN Chest Size (in) 38     Cup Size B     Height (in): 58 Weight (lb): 142 BMI: 29.79  Nuclear Med Study 1 or 2 day study:  1 day     Stress Test Type:  Eugenie Birks Reading MD:  Willa Rough, MD     Referring MD:  Tonny Bollman, MD Resting Radionuclide:  Technetium 63m Tetrofosmin     Resting Radionuclide Dose:  11.0 mCi  Stress Radionuclide:  Technetium 68m Tetrofosmin     Stress Radionuclide Dose:  33.0 mCi   Stress Protocol   Lexiscan: 0.4 mg   Stress Test Technologist:  Rea College, CMA-N     Nuclear Technologist:  Doyne Keel, CNMT  Rest Procedure  Myocardial perfusion imaging was performed at rest 45 minutes following the intravenous administration of Technetium 52m Tetrofosmin.  Stress Procedure  The patient received IV Lexiscan 0.4 mg over 15-seconds.  Technetium 22m Tetrofosmin injected at 30-seconds.  There were no significant changes with infusion.  Quantitative spect images were obtained after a 45 minute delay.  QPS Raw Data Images:  Normal; no motion artifact; normal heart/lung ratio. Stress Images:  Normal homogeneous uptake in all areas of the myocardium. Rest Images:  Normal homogeneous uptake in all areas of the  myocardium. Subtraction (SDS):  No evidence of ischemia. Transient Ischemic Dilatation:  0.97  (Normal <1.22)  Lung/Heart Ratio:  0.28  (Normal <0.45)  Quantitative Gated Spect Images QGS EDV:  61 ml QGS ESV:  15 ml QGS EF:  75 % QGS cine images:  Normal motion  Findings Normal nuclear study      Overall Impression  Exercise Capacity: Lexiscan with no exercise. BP Response: Normal blood pressure response. Clinical Symptoms: funny feeling ECG Impression: No significant ST segment change suggestive of ischemia. Overall Impression: Normal stress nuclear study.  Appended Document: Cardiology Nuclear Testing normal study.  Appended Document: Cardiology Nuclear Testing 06/30/10--1310pm--LMTCB--nt  Appended Document: Cardiology Nuclear Testing pt. aware

## 2010-09-20 NOTE — Miscellaneous (Signed)
Summary: Orders Update  Clinical Lists Changes  Orders: Added new Test order of Carotid Duplex (Carotid Duplex) - Signed 

## 2010-11-02 LAB — COMPREHENSIVE METABOLIC PANEL
ALT: 16 U/L (ref 0–35)
AST: 19 U/L (ref 0–37)
Albumin: 3.7 g/dL (ref 3.5–5.2)
Alkaline Phosphatase: 60 U/L (ref 39–117)
BUN: 16 mg/dL (ref 6–23)
CO2: 30 mEq/L (ref 19–32)
Calcium: 9.5 mg/dL (ref 8.4–10.5)
Chloride: 107 mEq/L (ref 96–112)
Creatinine, Ser: 1.16 mg/dL (ref 0.4–1.2)
GFR calc Af Amer: 54 mL/min — ABNORMAL LOW (ref >60)
GFR calc non Af Amer: 45 mL/min — ABNORMAL LOW (ref >60)
Glucose, Bld: 77 mg/dL (ref 70–99)
Potassium: 3.9 mEq/L (ref 3.5–5.1)
Sodium: 145 mEq/L (ref 135–145)
Total Bilirubin: 0.4 mg/dL (ref 0.3–1.2)
Total Protein: 7 g/dL (ref 6.0–8.3)

## 2010-11-02 LAB — D-DIMER, QUANTITATIVE: D-Dimer, Quant: 1.38 ug/mL-FEU — ABNORMAL HIGH (ref 0.00–0.48)

## 2010-11-02 LAB — POCT CARDIAC MARKERS
CKMB, poc: <1 ng/mL — ABNORMAL LOW (ref 1.0–8.0)
Myoglobin, poc: 94.9 ng/mL (ref 12–200)
Troponin i, poc: <0.05 ng/mL (ref 0.00–0.09)

## 2010-11-02 LAB — GLUCOSE, CAPILLARY
Glucose-Capillary: 133 mg/dL — ABNORMAL HIGH (ref 70–99)
Glucose-Capillary: 154 mg/dL — ABNORMAL HIGH (ref 70–99)
Glucose-Capillary: 180 mg/dL — ABNORMAL HIGH (ref 70–99)
Glucose-Capillary: 223 mg/dL — ABNORMAL HIGH (ref 70–99)
Glucose-Capillary: 250 mg/dL — ABNORMAL HIGH (ref 70–99)
Glucose-Capillary: 70 mg/dL (ref 70–99)

## 2010-11-02 LAB — CBC
HCT: 36.2 % (ref 36.0–46.0)
Hemoglobin: 12 g/dL (ref 12.0–15.0)
MCH: 29.9 pg (ref 26.0–34.0)
MCHC: 33.1 g/dL (ref 30.0–36.0)
MCV: 90 fL (ref 78.0–100.0)
Platelets: 287 10*3/uL (ref 150–400)
RBC: 4.02 MIL/uL (ref 3.87–5.11)
RDW: 13.4 % (ref 11.5–15.5)
WBC: 11.5 10*3/uL — ABNORMAL HIGH (ref 4.0–10.5)

## 2010-11-02 LAB — LIPID PANEL
Cholesterol: 138 mg/dL (ref 0–200)
HDL: 40 mg/dL (ref >39)
LDL Cholesterol: 62 mg/dL (ref 0–99)
Total CHOL/HDL Ratio: 3.5 RATIO
Triglycerides: 179 mg/dL — ABNORMAL HIGH (ref <150)

## 2010-11-02 LAB — HEMOGLOBIN A1C
Hgb A1c MFr Bld: 9.4 % — ABNORMAL HIGH (ref <5.7)
Mean Plasma Glucose: 223 mg/dL — ABNORMAL HIGH (ref <117)

## 2010-11-02 LAB — APTT: aPTT: 28 seconds (ref 24–37)

## 2010-11-02 LAB — TROPONIN I
Troponin I: 0.01 ng/mL (ref 0.00–0.06)
Troponin I: 0.04 ng/mL (ref 0.00–0.06)

## 2010-11-02 LAB — CARDIAC PANEL(CRET KIN+CKTOT+MB+TROPI): CK, MB: 1.2 ng/mL (ref 0.3–4.0)

## 2010-11-02 LAB — PROTIME-INR
INR: 0.98 (ref 0.00–1.49)
Prothrombin Time: 13.2 seconds (ref 11.6–15.2)

## 2010-11-02 LAB — TSH: TSH: 0.511 u[IU]/mL (ref 0.350–4.500)

## 2010-11-02 LAB — CK TOTAL AND CKMB (NOT AT ARMC): CK, MB: 1.1 ng/mL (ref 0.3–4.0)

## 2010-11-07 ENCOUNTER — Other Ambulatory Visit: Payer: Self-pay | Admitting: Thoracic Surgery

## 2010-11-07 DIAGNOSIS — R911 Solitary pulmonary nodule: Secondary | ICD-10-CM

## 2010-11-09 LAB — CBC
MCHC: 33.1 g/dL (ref 30.0–36.0)
RBC: 4.54 MIL/uL (ref 3.87–5.11)
RDW: 15.4 % (ref 11.5–15.5)

## 2010-11-09 LAB — URINE MICROSCOPIC-ADD ON

## 2010-11-09 LAB — COMPREHENSIVE METABOLIC PANEL
ALT: 172 U/L — ABNORMAL HIGH (ref 0–35)
AST: 443 U/L — ABNORMAL HIGH (ref 0–37)
Calcium: 9.7 mg/dL (ref 8.4–10.5)
GFR calc Af Amer: 57 mL/min — ABNORMAL LOW (ref >60)
Sodium: 135 mEq/L (ref 135–145)
Total Protein: 9.1 g/dL — ABNORMAL HIGH (ref 6.0–8.3)

## 2010-11-09 LAB — GLUCOSE, CAPILLARY: Glucose-Capillary: 178 mg/dL — ABNORMAL HIGH (ref 70–99)

## 2010-11-09 LAB — DIFFERENTIAL
Eosinophils Absolute: 0 10*3/uL (ref 0.0–0.7)
Eosinophils Relative: 0 % (ref 0–5)
Lymphs Abs: 1.9 10*3/uL (ref 0.7–4.0)
Monocytes Absolute: 1.4 10*3/uL — ABNORMAL HIGH (ref 0.1–1.0)
Monocytes Relative: 9 % (ref 3–12)

## 2010-11-09 LAB — URINALYSIS, ROUTINE W REFLEX MICROSCOPIC
Hgb urine dipstick: NEGATIVE
Nitrite: NEGATIVE
Specific Gravity, Urine: 1.017 (ref 1.005–1.030)
Urobilinogen, UA: 0.2 mg/dL (ref 0.0–1.0)
pH: 8 (ref 5.0–8.0)

## 2010-11-09 LAB — POCT CARDIAC MARKERS: Troponin i, poc: <0.05 ng/mL (ref 0.00–0.09)

## 2010-11-23 ENCOUNTER — Other Ambulatory Visit: Payer: Self-pay

## 2010-11-23 ENCOUNTER — Ambulatory Visit: Payer: Self-pay | Admitting: Thoracic Surgery

## 2010-11-24 LAB — GLUCOSE, CAPILLARY: Glucose-Capillary: 185 mg/dL — ABNORMAL HIGH (ref 70–99)

## 2010-11-29 ENCOUNTER — Ambulatory Visit (INDEPENDENT_AMBULATORY_CARE_PROVIDER_SITE_OTHER): Payer: PRIVATE HEALTH INSURANCE | Admitting: Thoracic Surgery

## 2010-11-29 ENCOUNTER — Ambulatory Visit
Admission: RE | Admit: 2010-11-29 | Discharge: 2010-11-29 | Disposition: A | Discharge status: Home / Self Care | Payer: PRIVATE HEALTH INSURANCE | Source: Ambulatory Visit | Attending: Thoracic Surgery | Admitting: Thoracic Surgery

## 2010-11-29 DIAGNOSIS — R911 Solitary pulmonary nodule: Secondary | ICD-10-CM

## 2010-11-29 DIAGNOSIS — D381 Neoplasm of uncertain behavior of trachea, bronchus and lung: Secondary | ICD-10-CM

## 2010-11-30 NOTE — Assessment & Plan Note (Signed)
OFFICE VISIT  Dana Blackwell, Dana Blackwell DOB:  05-27-1928                                        November 29, 2010 CHART #:  [id number]  The patient returns for followup for a CT scan and we checked a CT scan that shows marked resolution of her 5-mm nodule that shows pulmonary hypertension.  Her blood pressure is 117/56, pulse 66, respirations 18, sats were 94%.  There is resolution of this lesion.  I will refer back her medical doctor.  We will see her again if she has any future problems.  Ines Bloomer, M.D. Electronically Signed  DPB/MEDQ  D:  11/29/2010  T:  11/30/2010  Job:  [id number]

## 2011-01-02 ENCOUNTER — Encounter: Payer: Self-pay | Admitting: Cardiovascular Disease

## 2011-01-03 NOTE — Assessment & Plan Note (Signed)
[facility]                            CARDIOLOGY OFFICE NOTE   NAME:Dana Blackwell, Dana Blackwell               MRN:          [id number]  DATE:08/06/2007                            DOB:          05-06-1928    [name] was seen in followup at the [facility] Office  on August 06, 2007.  [name] is a delightful 77 year old woman with  coronary artery disease.  She had a heart catheterization in 2005 that  demonstrated moderate 3-vessel CAD with diffuse branch vessel disease,  and she has been treated medically.  She had a Cardiolite study in 2005  that was normal, and this was after her catheterization procedure.  She  presents today for followup of her CAD.  She has been feeling well  overall.  She describes occasional chest pains that occur with activity.  She really is not limited in activities that she likes to do.  Her chest  pains are over the left chest and are fleeting in nature.  It is a pins  and needles sensation, but the pains are related to exertion.  She has  no dyspnea.  Her main complaint relates to leg cramps.  She has no other  complaints at this time.  Specifically denies orthopnea, PND, edema, or  palpitations.  She denies light-headedness or syncope.   CURRENT MEDICATIONS:  1. Caduet 10/40 mg daily.  2. Zoloft 50 mg daily.  3. Boniva monthly.  4. Aspirin 325 mg daily.  5. Synthroid 0.075 mg daily.  6. Toprol XL 150 mg daily.  7. Imdur 60 mg daily.  8. Foltx daily.  9. Glucovance 2.5/500 mg twice daily.  10.Altace 10 mg twice daily.  11.Avandia 8 mg daily.  12.Nexium 40 mg daily.   ALLERGIES:  TETANUS.   EXAM:  The patient is alert and oriented.  She is an elderly woman in no  acute distress.  Weight is 162, blood pressure 126/60, heart rate is 63, respiratory rate  is 16.  HEENT:  Normal.  NECK:  Normal carotid upstrokes without bruits.  Jugular venous pressure  is normal.  LUNGS:  Clear to auscultation  bilaterally.  HEART:  Regular rate and rhythm without murmurs or gallops.  ABDOMEN:  Soft and nontender.  No organomegaly.  EXTREMITIES:  No cyanosis, clubbing, or edema.  Peripheral pulses are 2+  and equal throughout.   EKG shows normal sinus rhythm with a heart rate of 63.  EKG is within  normal limits.   ASSESSMENT:  1. Coronary artery disease.  Stable angina with minimal limitation.      Continue medical therapy.  Blood pressure and heart rate are under      ideal control.  Overall, the patient is doing quite well.  I have      recommended no changes in her medical regimen.  2. Hypertension, well controlled on her current regimen.  3. Type 2 diabetes.  Per Dr. Wylene Simmer.   Overall, [name] continues to do quite well.  I will plan on  seeing her back on a yearly basis.  I would be happy to see her  sooner  if any problems arise.     Veverly Fells. Excell Seltzer, MD  Electronically Signed    MDC/MedQ  DD: 08/06/2007  DT: 08/06/2007  Job #: [id number]   cc:   Gaspar Garbe, M.D.

## 2011-01-03 NOTE — Assessment & Plan Note (Signed)
[facility]                            CARDIOLOGY OFFICE NOTE   NAME:Dana Blackwell, Dana Blackwell               MRN:          [id number]  DATE:08/06/2008                            DOB:          1928-02-14    REASON FOR VISIT:  Followup CAD.   HISTORY OF PRESENT ILLNESS:  Ms. Dana Blackwell is a 77 year old woman  with diffuse multivessel CAD, mainly involving small branch vessels and  the distal vessel distribution.  She has been treated medically.  Her  cardiac catheterization was back in 2002.  She had an adenosine Myoview  scan in September 2007 that was negative for ischemia.  The LVEF was  74%.   She continues to have rare chest pain when she is tired.  She really  does not have exertional symptoms.  She denies orthopnea, PND, edema,  palpitations, shortness of breath, lightheadedness, or syncope.   MEDICATIONS:  1. Caduet 10/40 mg daily.  2. Zoloft 50 mg daily.  3. Toprol 100 mg daily.  4. Aspirin 325 mg daily.  5. Ocuvite daily.  6. Synthroid 0.075 daily.  7. Imdur 60 mg daily.  8. Foltx daily.  9. Glucovance 2.5/500 mg b.i.d.  10.Altace 10 mg b.i.d.  11.Avandia 8 mg daily.  12.Nexium 40 mg daily.   ALLERGIES:  TETANUS.   PHYSICAL EXAMINATION:  GENERAL:  The patient is alert and oriented obese  woman in no acute distress.  VITAL SIGNS:  Weight 149 pounds, blood pressure 124/64, heart rate 63,  and respiratory rate 12.  HEENT:  Normal.  NECK:  Normal carotid upstrokes.  No bruits.  JVP normal.  LUNGS:  Clear bilaterally.  HEART:  Regular rate and rhythm.  No murmurs, rubs, or gallops.  ABDOMEN:  Soft and nontender.  No organomegaly.  No abdominal bruits.  EXTREMITIES:  No clubbing, cyanosis, or edema.  Peripheral pulses are  intact and equal.  SKIN:  Warm and dry without rash.   EKG shows normal sinus rhythm and is within normal limits.  The heart  rate is 60 beats per minute.   ASSESSMENT:  1. Diffuse coronary artery disease.   The patient has no significant      angina.  She will continue with her current therapy without      changes.  She is on an excellent medical regimen under the care of      Dr. Wylene Blackwell.  This includes beta-blocker, ACE inhibitor, aspirin,      and statin.  2. Hyperlipidemia.  Managed by Dr. Wylene Blackwell.  3. Hypertension.  Blood pressure is under ideal control on her current      regimen.   PLAN:  Ms. Dana Blackwell remain stable from a cardiac standpoint.  She has had  no angina and no changes in her symptoms since her last stress test 2  years ago.  I do not see any indication to perform repeat stress testing  at this point.  I would like to see her back in 1 year for followup.  She was instructed to notify us at any time if there are changes in her  symptoms.     Dana Blackwell.  Dana Seltzer, MD  Electronically Signed    MDC/MedQ  DD: 08/06/2008  DT: 08/07/2008  Job #: [id number]

## 2011-01-03 NOTE — Letter (Signed)
July 26, 2010   Massie Maroon, MD  [address]  [place], [place] [address]   Re:  LALITA, EBEL MAE              DOB:  [birth year]   Dear Dr. Selena Batten,   I appreciate the opportunity of seeing this 77 year old Caucasian female  who was initially in the hospital with chest pain and has been treated  by Dr. Excell Seltzer for that.  At that time, she had a CT scan to rule out a  pulmonary illness that showed a 5-mm nodule with right lower lobe and  she is referred here for evaluation.  Her coronary artery status as she  has a non-ST-elevation MI in 2004, and coronary disease have been  medically managed.  Her ejection fraction was 70% and the last Myoview,  which is in September 2007.  She also has some slight right-sided chest  pain.  She has had no hemoptysis, fever, chills or excessive sputum.   PAST MEDICAL HISTORY:  She has diabetes, hypothyroidism, hypertension,  hyperlipidemia, chronic obstructive pulmonary disease, retinal  thrombosis of the left eye, right hip pain with degenerative disease.  Previous tubal ligation, cholecystectomy, neck surgery, cervical  diskectomy.   Ate age 29, Dr. Garvin Fila did a hysterectomy for uterine cancer.  She also had a bladder suspension.   MEDICATIONS:  Norvasc, Zoloft, Toprol, aspirin, Ocuvite, Synthroid,  isosorbide dinitrate, __________, Lasix, Lotensin, Crestor, Protonix,  hydrocodone, acetaminophen, metformin, and Humalog.   ALLERGIES:  She is allergic to Anaphase and tetanus.   FAMILY HISTORY:  Positive for diabetes and coronary heart disease.   SOCIAL HISTORY:  She is a widow.  She does not smoke or drink.   PHYSICAL EXAMINATION:  Vital Signs:  She is 129 pounds.  Her blood  pressure is 129/64, pulse 68, respirations 18, sats were 93%.  Head,  Eyes, Ears, Nose and Throat:  Unremarkable.  Neck:  Supple without  thyromegaly.  Chest:  Clear to auscultation and percussion.  Heart:  Regular sinus rhythm.  No murmurs.  ABDOMEN:   Soft.  No  hepatosplenomegaly.  Extremities:  Pulses are 2+.  There is no clubbing  or edema.   REVIEW OF SYSTEMS:  GENERAL:  She is 145 pounds.  She is 4 feet 10.  In  general, her weight has been stable.  CARDIAC:  She gets angina and some shortness of breath with exertion.  PULMONARY:  She has bronchitis.  No hemoptysis.  GI:  She has diarrhea, constipation, dysphagia and hiatal hernia.  GU:  She got no kidney disease but urinary incontinence.  VASCULAR:  She has pain in her leg with walking.  She has had a previous  small TIA.  NEUROLOGIC:  No dizziness, headaches, blackouts or seizures.  MUSCULOSKELETAL:  Arthritis.  PSYCHIATRIC:  No depression or nervous.  EYE/ENT:  No change in eyesight or hearing.  HEMATOLOGIC:  No problems with bleeding, clotting disorders or anemia.   I feel that this 5-mm nodule is such that it can be easily followed.  It  would not show up on CAT scan and the only way we could do a biopsy  would be an open biopsy and given her medical history and age, I think  this is not feasible at this time.  I will plan to repeat a CT scan in 4  months and if he gets larger, then will be more aggressive than her  workup.  I explained this to her and she  agrees with this plan.   Sincerely,   Ines Bloomer, M.D.  Electronically Signed   DPB/MEDQ  D:  07/26/2010  T:  07/27/2010  Job:  [id number]

## 2011-01-04 ENCOUNTER — Ambulatory Visit (INDEPENDENT_AMBULATORY_CARE_PROVIDER_SITE_OTHER): Payer: PRIVATE HEALTH INSURANCE | Admitting: Cardiovascular Disease

## 2011-01-04 ENCOUNTER — Encounter: Payer: Self-pay | Admitting: Cardiovascular Disease

## 2011-01-04 DIAGNOSIS — I2510 Atherosclerotic heart disease of native coronary artery without angina pectoris: Secondary | ICD-10-CM

## 2011-01-04 DIAGNOSIS — I6529 Occlusion and stenosis of unspecified carotid artery: Secondary | ICD-10-CM

## 2011-01-04 DIAGNOSIS — E785 Hyperlipidemia, unspecified: Secondary | ICD-10-CM

## 2011-01-04 DIAGNOSIS — I10 Essential (primary) hypertension: Secondary | ICD-10-CM

## 2011-01-04 NOTE — Patient Instructions (Signed)
Your physician has requested that you have a carotid duplex in OCTOBER. This test is an ultrasound of the carotid arteries in your neck. It looks at blood flow through these arteries that supply the brain with blood. Allow one hour for this exam. There are no restrictions or special instructions.  Your physician wants you to follow-up in: 1 YEAR.  You will receive a reminder letter in the mail two months in advance. If you don't receive a letter, please call our office to schedule the follow-up appointment.  Your physician recommends that you continue on your current medications as directed. Please refer to the Current Medication list given to you today.

## 2011-01-06 NOTE — Discharge Summary (Signed)
[place]. [facility]  Patient:    Dana Blackwell, Dana Blackwell [facility] Visit Number: [id number] MRN: [id number]          Service Type: MED Location: 2000 2002 01 Attending Physician:  Learta Codding Dictated by:   Brita Romp, P.A. Admit Date:  05/24/2001 Discharge Date: 05/29/2001   CC:         Dana Blackwell, M.D.  Anselmo Rod, M.D.   Discharge Summary  DISCHARGE DIAGNOSES: 1. Myocardial infarction. 2. Coronary artery disease, status post cardiac catheterization. 3. Diabetes mellitus. 4. Hypertension. 5. Hypothyroidism. 6. Heme positive stools.  HISTORY OF PRESENT ILLNESS:  Dana Blackwell is a 77 year old female with no prior coronary artery disease history.  On the evening of October 1, she had an onset of substernal chest pain which was continuous and accompanied by shortness of breath, but there was no nausea, vomiting or diaphoresis.  The following day, she saw her primary physician and electrocardiogram was done. On the morning of admission, the pain continued and was accompanied by nausea. She presented to the [facility] Emergency Room.  She was seen and admitted by Dr. Andee Lineman.  He felt that her symptoms were consistent with an acute coronary syndrome.  He noted that her initial troponin I was elevated at 0.78.  He started the patient on aspirin, nitroglycerin, heparin, as well as Integrilin.  He also planned for cardiac catheterization the next day.  On exam, he noticed that the pulse in the right groin was decreased and recommended catheterization using the left groin approach.  HOSPITAL COURSE:  The following day, the patient reported no chest pain or shortness of breath.  However, she did report some headache and general feeling "lousy."  On exam, it was noted that her heart was elevated at 104 and she was hypertensive at 182/64.  It was also noted that her cardiac enzymes had climbed.  Her CK was 383, MB fraction 71.6 and  troponin I was 4.27.  As a result of her hypertension, her Lopressor and Altace were increased.  She also reported history of postop nausea and vomiting and Zofran was ordered for on call to the catheterization lab.  Later that day, the patient was taken to the catheterization lab by Dr. Shawnie Pons.  Catheterization results as follows:  Left ventricle with ejection fraction greater than 55%.  In the LAO projection there was question of a very mild posterolateral hypokinesis.  Left main coronary artery free of significant disease.  Left anterior descending coronary artery with 20-30% irregularity proximally.  The mid and distal vessel had diffuse luminal irregularities of 30-40%.  At the most apical portion of the distal vessel, there was a 90% focal stenosis.  There was also noted to be a 90% stenosis in the mid portion of the diagonal.  Circumflex artery was noted to be dominant.  The first marginal was tiny with 90% narrowing.  The second marginal had a 70% narrowing and there was a stump of an apparent marginal subbranch.  The AV circumflex was without critical disease and supplied four additional branches.  There was a questionable 40% lesion in the posterolateral and posterior descending branch.  Dr. Riley Kill felt that the second marginal was likely the culprit vessel.  Right coronary artery was nondominant.  There was a 90% narrowing in the proximal to mid vessel.  Subclavian artery was noted to be widely patent as well as internal mammary artery.  Dr. Riley Kill planned to review the films with Dr.  DeGent.  He felt that likely medical therapy would be the best option, however, he felt that surgery should be considered although the left anterior descending was not clearly blocked.  The next day, the patient was doing well and had no further chest pain.  Dr. Riley Kill stopped the patients Integrilin.  He still continued to plan to confer with Dr. Andee Lineman on further plans with the  patient.  The next day, Dr. Riley Kill again saw the patient.  She complained of occasional chest heaviness.  They increased her metoprolol.  On October 7, the patient reported no chest pain, chest heaviness or shortness of breath.  However, she did report some fatigue secondary to onset of what she described as a cold.  Later that day, the patient was seen by Dr. Cornelius Moras of CVTS.  After reviewing the patients catheterization lab films and examining the patient, he was unsure that the potential benefits of a bypass surgery would justify the associated risks.  He was hoping to reconsider surgery as an option if symptoms persisted in the face of continued medical therapy.  On October 8, the patient was seen by Dr. Antoine Poche.  She had had no further chest pain or shortness of breath. He increased the patients Imdur.  He also noted that the patient had a guaiac positive stool and requested GI consultation.  The patient was then seen by Dr. Charna Elizabeth of the GI service.  After examining the patient, she felt that in light of the patients history of dark stools as well as dysphagia that EGD was indicated to further evaluation. However, since the patient had a recent myocardial infarction, she planned to hold off an endoscopy for several weeks.  She recommended close monitoring of the CBC and to continue the patient on 40 mg of Protonix daily.  She also felt that further work-up could be done on an outpatient basis.  On September 9, the patient was seen by Dr. Graceann Congress.  He felt that the patient was doing well and she had had no further chest pain or shortness of breath and he felt that she was stable for discharge.  DISCHARGE MEDICATIONS: 1. Altace 2.5 mg b.i.d. 2. Enteric coated aspirin 325 mg q.d. 3. Synthroid 0.075 mg q.d. 4. Premarin 0.625 mg q.d. 5. Toprol XL 150 mg (this is 1-1/2 100 mg tablets) daily. 6. Glucovance 2.5/500 as previously taken. 7. Imdur 60 mg q.d.  8.  Nitroglycerin 0.4 mg sublingual as needed. 9. Protonix 40 mg q.d.  ACTIVITY:  The patient was advised to slowly return to her normal level of activity and to continue with outpatient cardiac rehabilitation.  DIET:  Low fat, low cholesterol diet.  FOLLOWUP:  She is to follow up with Dr. Loreta Ave in the office and the office is to call her.  She is to follow up with Dr. Quintella Blackwell as needed or scheduled.  She is to follow up in the [place] offices for groin check on October 17, at 9 a.m.  She is to follow up with Dr. Andee Lineman in approximately six weeks in the [place] office and the office is to call her.  LABORATORY DATA AND X-RAY FINDINGS:  Total cholesterol 149, triglycerides 211, HDL 57, total cholesterol at HDL ratio 2.6, LDL 50.  Glycosylated hemoglobin 8.2.  TSH 1.559.  White count 11.8, hemoglobin 9.4, hematocrit 27.5, platelets 410.  Sodium 142, potassium 4.4, chloride 102, CO2 33, BUN 22, creatinine 1.0, glucose 155.  Portable chest x-ray on October 6, revealed low volume  inspiration with vascular crowding and bibasilar atelectasis. Electrocardiogram on October 7, revealed sinus rhythm at approximately 82, PR interval 128, QRS 0.80, QTC 399, axis -18. Dictated by:   Brita Romp, P.A. Attending Physician:  Learta Codding DD:  05/29/01 TD:  05/30/01 Job: [id number]

## 2011-01-06 NOTE — Consult Note (Signed)
[place]. [facility]  Patient:    Dana Blackwell, Dana Blackwell [facility] Visit Number: [id number] MRN: [id number]          Service Type: MED Location: 2000 2002 01 Attending Physician:  Learta Codding Dictated by:   Salvatore Decent. Cornelius Moras, M.D. Proc. Date: 05/27/01 Admit Date:  05/24/2001 Discharge Date: 05/29/2001   CC:         Maisie Fus D. Riley Kill, M.D. [facility]  Lewayne Bunting, M.D. [facility]  Tinnie Gens C. Quintella Reichert, M.D.  Patients Chart  [facility] Office   Consultation Report  REQUESTING PHYSICIAN:  Arturo Morton. Riley Kill, M.D. [facility]  PRIMARY CARDIOLOGIST:  Lewayne Bunting, M.D. [facility]  PRIMARY CARE PHYSICIAN:  Lacretia Leigh. Quintella Reichert, M.D.  REASON FOR CONSULTATION:  Severe 1-vessel coronary artery disease status post acute myocardial infarction.  HISTORY OF PRESENT ILLNESS:  The patient is a 77 year old, obese white female with no previous cardiac history, but risk factors including hypertension, type 2 diabetes mellitus, and obesity. The patient was admitted to the hospital on May 23, 2001, with an acute myocardial infarction. She underwent cardiac catheterization the following day by Dr. Riley Kill. This demonstrates severe 1-vessel coronary artery disease, although there is multi-vessel disease of noncritical significance. Cardiac surgical consultation is requested.  REVIEW OF SYSTEMS:  Is notable for the following categories and pertinent data:  CARDIAC:  The patient describes new-onset chest pain on the evening prior to admission. She states that she contacted her primary care physician but could not be seen until sometime in November. The pain got worse that evening prompting her to present to the emergency room. She also reports symptoms of moderate exertional shortness of breath  as well as 2-pillow orthopnea and some mild, bilateral lower extremity edema. She denies any previous history of palpitations, syncope, or near-syncopal episodes. She has had some mild, substernal chest pressure which  has persisted since her admission and stabilization. She denies any chest pain at present. GENERAL: Negative. The patient reports no recent change in constitutional symptoms and specifically denies any recent weight gain or weight loss. RESPIRATION: Notable for history of bronchitis. She denies any ongoing productive cough or history of hemoptysis. GASTROINTESTINAL:  Negative. The patient denies any recent history of hematemesis, hematochezia, or melena. She has been noted to have heme-positive stool during this hospitalization in the setting of anticoagulation related to her MI. NEUROLOGIC:  Notable for the absence of any recent symptoms of TIA or amaurosis fugax. 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 MUSCULOSKELETAL:  Negative. GU:  Negative. INFECTIOUS:  Negative. HEMATOLOGIC: Notable for episode of epistaxis during this hospitalization. She also states that she bruises easily. ENDOCRINE:  Notable for history of hypothyroidism and type 2 diabetes mellitus. She states that she is aware that her diabetes has not been well controlled. PSYCHIATRIC:  Negative. PERIPHERAL/VASCULAR: Negative. The patient denies symptoms of claudication. HEENT:  Negative.  PAST MEDICAL HISTORY:  Notable for: 1. Hypertension. 2. Type 2 diabetes mellitus. 3. Hypothyroidism. 4. Uterine cancer status post transabdominal hysterectomy. 5. Stress urinary incontinence and has undergone previous bladder    tacking on 2 occasions. 6. Gallbladder removal.  There is no known history of hyperlipidemia.  FAMILY HISTORY:  Notable in that her mother suffered from a myocardial infarction from which he passed away at age 83. Her father did not have coronary artery disease but she has 1 brother who is alive that has been diagnosed with coronary artery disease.  SOCIAL  HISTORY:  The patient lives alone and is retired. She denies any history of tobacco use  or significant alcohol consumption.  MEDICATIONS PRIOR TO ADMISSION: 1. Diovan/hydrochlorothiazide combination 1 tablet daily. 2. Premarin 0.625 mg daily. 3. Potassium chloride 40 mEq daily. 4. Aciphex 20 mg daily. 5. Glucovance 1 tablet 3 times daily. 6. Aspirin 81 mg daily. 7. Verapamil 240 mg daily. 8. Synthroid 0.075 mg daily.  PHYSICAL EXAMINATION:  GENERAL:  A well-appearing, obese white female who appears her stated age. No acute distress.  VITAL SIGNS:  She is in sinus rhythm by monitor and has a low-grade temperature of 100 degrees Fahrenheit. She is normotensive. Respirations are 20 and unlabored.  HEENT:  Essentially within normal limits.  NECK:  Supple. There is no cervical or supraclavicular lymphadenopathy. No carotid bruits are noted.  CHEST:  Auscultation of the chest reveals clear and symmetrical breath sounds bilaterally. No wheezes or rhonchi noted.  CARDIOVASCULAR:  Demonstrates regular rate and rhythm. No murmurs, rubs or gallops are appreciated.  ABDOMEN:  Moderately obese, soft, and nontender. There is a well-healed, previous surgical incision from cholecystectomy and hysterectomy.  EXTREMITIES:  Warm and well-diffused. There is no lower extremity edema. Distal pulses are palpable although diminished in both lower legs.  NEUROLOGIC:  Grossly nonfocal and symmetrical throughout.  SKIN:  Her skin is thin with no open skin lesions or ulcerations, although she does have numerous bruises. No other abnormalities are noted.  The remainder of her physical exam is unrevealing.  DIAGNOSTIC TESTS:  Cardiac catheterization films performed May 24, 2001, is  reviewed. This is notable for severe 1-vessel coronary artery disease with normal left ventricular function. Specific abnormalities include 30%-40% stenosis of the mid-left anterior descending coronary artery, 80% stenosis of the mid portion of the diagonal branch, and a hazy 80% proximal stenosis of  a large first circumflex marginal branch. There is a sub-branch of this vessel which is 100% occluded. There is left dominant coronary circulation. The remaining left circumflex coronary artery is free of disease although there appears to be a 30%, perhaps 40% stenosis of the proximal posterior descending coronary artery. There is 90% proximal stenosis of a small nondominant right coronary artery. Left ventricular function appears normal with ejection fraction estimated to be 68%.  IMPRESSION: Severe 1-vessel coronary artery disease status post acute myocardial infarction likely attributable to the patients large first circumflex marginal branch and its occluded sub-branch. Dana Blackwell has had some symptoms of class IV post-infarction angina. I am not impressed with the appearance of the left anterior descending coronary artery or the distal left circumflex territories on coronary angiography. As such, I am not sure that the potential benefits of coronary artery bypass grafting are worth the associated potential morbidity and mortality risks. Certainly we could proceed with coronary artery bypass grafting if Dana Blackwell failed an attempt at nonoperative management. However, I would consider potentially attempting percutaneous coronary intervention on the large circumflex marginal branch as well as a trial of optimization of her medical therapy. She also may need further evaluation because of her heme-positive stool documented during this hospitalization.  RECOMMENDATIONS:  Consider PCI of the large first circumflex marginal branch and/or optimization of medical therapy. We could reevaluate the possible need for elective coronary artery bypass grafting if Dana Blackwell fails an attempt at nonsurgical therapy. Dictated by:   Salvatore Decent Cornelius Moras, M.D. Attending Physician:  Learta Codding DD:  05/27/01 TD:  05/28/01 Job: [id number]

## 2011-01-06 NOTE — Procedures (Signed)
[place]. [facility]  Patient:    Dana Blackwell, Dana Blackwell Visit Number: [id number] MRN: [id number]          Service Type: Attending:  Anselmo Rod, M.D. Dictated by:   Anselmo Rod, M.D. Proc. Date: 07/24/01   CC:         Lacretia Leigh. Quintella Reichert, M.D.                           Procedure Report  DATE OF BIRTH:  1928-06-02.  PROCEDURE:  Colonoscopy.  ENDOSCOPIST:  Anselmo Rod, M.D.  INSTRUMENT USED:  Olympus video colonoscope.  INDICATION FOR PROCEDURE:  Blood in stool and a history of iron deficiency anemia in a 77 year old white female.  Rule out colonic polyps, masses, hemorrhoids, etc.  PREPROCEDURE PREPARATION:  Informed consent was procured from the patient. The patient was fasted for eight hours prior to the procedure and prepped with a bottle of magnesium citrate and a gallon of NuLytely the night prior to the procedure.  PREPROCEDURE PHYSICAL:  VITAL SIGNS:  The patient had stable vital signs.  NECK:  Supple.  CHEST:  Clear to auscultation.  S1, S2 regular.  ABDOMEN:  Soft with normal bowel sounds.  DESCRIPTION OF PROCEDURE:  The patient was placed in the left lateral decubitus position and sedated with Demerol and Versed for the EGD.   No additional sedation was used.  The patient developed some erythematous lesions on her hands from Demerol infusion, and therefore Benadryl 25 mg IV was given. Once the patient was positioned in her left lateral position, the Olympus video colonoscope was advanced up to the cecum without difficulty.  No masses, polyps, erosions, ulcerations were seen.  IMPRESSION:  Normal colonoscopy.  RECOMMENDATIONS: 1. A small bowel follow-through will be done on an outpatient basis to    complete the GI evaluation. 2. A CBC will be checked today, and further recommendations will be made as    needed. Dictated by:   Anselmo Rod, M.D. Attending:  Anselmo Rod, M.D. DD:  07/24/01 TD:   07/24/01 Job: [id number]

## 2011-01-06 NOTE — Letter (Signed)
April 24, 2006     Gaspar Garbe, M.D.  [address]  [place], [place] [address]   RE:  ALAISA, MOFFITT  MRN:  [id number]  /  DOB:  [birth year]   Dear Dr. Wylene Simmer:   It was my pleasure to see Ms. Wagoner at the [facility].  As  you know, she is a very pleasant 77 year old woman with known coronary  artery disease who presents today for evaluation of chest discomfort.  She  reports a 2-3 month history of intermittent chest pressure that occurs with  fairly modest activities.  She reports discomfort with housework such as  washing dishes or changing bed sheets.  She has not had any resting symptoms  that she reports.  She also complains of exertional dyspnea which has been a  more longstanding chronic problem for her.  She does have some atypical  features to her chest pain as she complains of feeling a hard heartbeat  which occurs at rest and with activity.  She has not taken any nitroglycerin  as her symptoms have been fairly brief in nature.  She states that generally  her chest pressure lasts for 5 minutes or less and resolves with sitting  down and resting.   Her past evaluation has included two heart catheterizations.  Her most  recent heart cath was in February 2005 which demonstrated moderate 3-vessel  coronary artery disease and diffuse branch vessel disease.  At that time,  medical management was opted for as there was not a high-grade discrete  stenosis seen.  She did undergo an adenosine Cardiolite following this and  following her catheterization in March of 2005 and this was found to be  normal with no evidence of ischemia or infarction in any vascular territory.   Her current medicines include:  1. Aspirin 325 mg daily.  2. Ocuvite daily.  3. Synthroid 0.075 mg daily.  4. Toprol 150 mg daily.  5. Imdur 60 mg daily.  6. Foltx daily.  7. Glucovance three times daily.  8. Altace 10 mg twice daily.  9. Calcium.  10.Vitamin C daily.  11.Avandia 8 mg daily.  12.Nexium 40 mg daily.  13.Caduet 10 mg daily.   PHYSICAL EXAMINATION:  She is alert and oriented in no acute distress.  Weight is 168 pounds, blood pressure is 112/56, heart rate 74, respiratory  rate is 12.  ENT:  The oropharynx is clear.  EYES:  Sclerae anicteric.  Conjunctivae pink.  NECK:  Normal carotid upstrokes without bruits; jugular venous pressure is  normal.  There is no cervical lymphadenopathy.  LUNGS:  Clear to auscultation bilaterally.  CARDIOVASCULAR:  Apex discrete and nondisplaced.  The heart is regular rate  and rhythm without murmurs or gallops.  ABDOMEN:  Soft, nontender, no organomegaly, no abdominal bruits are present.  EXTREMITIES:  There is no clubbing, cyanosis or edema.  Peripheral pulses  are 2+ and equal throughout.  SKIN:  Warm and dry without rash.   ASSESSMENT:  Ms. Ardelle Anton has the following cardiovascular problems:  1. Coronary artery disease and exertional chest pain.  Although she does      have some atypical features, I think that her overall symptom complex      sounds consistent with angina.  She has known coronary artery disease      and certainly myocardial ischemia is plausible in her case.  Her most      recent stress test was over 2 years ago and with the onset  of new      symptoms, I would like to repeat an adenosine myocardial perfusion scan      to reevaluate her.  I also will review her cardiac catheterization      films to see if it would be reasonable to repeat a cardiac      catheterization pending the results of her adenosine stress study.  In      the meantime, I would like to keep her on the current medications and I      have written her a prescription for sublingual nitroglycerin to take if      she has any chest pain episodes that last for longer than 5 minutes.  I      will be in contact with her after the results of her stress test are      available.  2. Hypertension.  She is currently very well  controlled on multiple      medications as listed above.  3. Diabetes.  As per your treatment with oral hypoglycemia therapy.   As noted above, I will follow up with Ms. Wagoner after her stress test is  available.  Please feel free to contact me at any time with questions  regarding her care.    Sincerely,      Veverly Fells. Excell Seltzer, MD   MDC/MedQ  DD:  04/24/2006  DT:  04/24/2006  Job #:  [id number]

## 2011-01-06 NOTE — Cardiovascular Report (Signed)
[place]. [facility]  Patient:    Dana Blackwell, Dana Blackwell [facility] Visit Number: [id number] MRN: [id number]          Service Type: MED Location: MICU 2104 01 Attending Physician:  Learta Codding Dictated by:   Arturo Morton. Riley Kill, M.D. [facility] Proc. Date: 05/24/01 Admit Date:  05/24/2001   CC:         Lewayne Bunting, M.D. [facility]  Cardiac Catheterization Lab  [place] C. Quintella Reichert, M.D.   Cardiac Catheterization  INDICATIONS:  Ms. Dana Blackwell is a delightful 77 year old white female with no known prior history of coronary artery disease.  She developed 10/10 chest pain without definite EKG changes.  She had nausea.  She has a history of diabetes and hypertension.  She also has a history of uterine cancer and hypothyroidism.  Her troponins were positive and she was subsequently brought to the catheterization lab for further evaluation.  PROCEDURE: 1. Left heart catheterization. 2. Selective coronary arteriography. 3. Selective left ventriculography. 4. Subclavian angiography.  DESCRIPTION OF PROCEDURE:  The procedure was performed from the right femoral artery using 6-French catheters.  The initial aortic pressure was quite high and we gave labetalol and intravenous Lopressor.  Angiographic studies were done without complication.  Ventriculography was performed in the RAO and LAO projections.  There were no complications from the procedure.  HEMODYNAMIC DATA: 1. Central aorta:  198/93. 2. Left ventricle:  170/17. 3. No aortic left ventricular gradient.  ANGIOGRAPHIC DATA: 1. Ventriculography was performed in both the RAO and LAO projections.    Overall global systolic function was preserved with an ejection fraction of    greater than 55%.  There was perhaps in the LAO projection very mild    posterolateral hypokinesis. 2. The left main coronary artery was free of significant disease. 3. The left anterior descending artery demonstrates about 20-30% irregularity  in the proximal vessel.  The mid and distal vessel consists of a diabetic-    appearing vessel with diffuse luminal irregularities of 40% and 30-40%.    There is a 90% focal stenosis at the distal most apical portion.  There is    a 90% stenosis in the mid portion of the diagonal as well. 4. The circumflex is a dominant vessel.  There is a tiny first marginal with    90% narrowing.  There is a second marginal with 70% narrowing and there is    a stump of what appears to be a marginal sub branch.  This is likely the    infarct vessel.  The AV circumflex is without critical disease and supplies    four more branches distally.  There is perhaps 40% leading into the    posterolateral or posterior descending branch. 5. The right coronary artery is nondominant with 90% narrowing, and a small    vessel. 6. The subclavian is widely patent as is the internal mammary.  CONCLUSIONS: 1. Preserved overall left ventricular function. 2. Occlusion of the sub branch of the second obtuse marginal branch with a    fairly high-grade disease involving the superior sub branch and scattered    lesions as noted above.  DISPOSITION:  I plan to review the films with Dr. Andee Lineman.  We will likely recommend medical therapy but surgery is also a possibility although the LAD is not critically blocked.  We will review the options. Dictated by:   Arturo Morton Riley Kill, M.D. [facility] Attending Physician:  Learta Codding DD:  05/24/01 TD:  05/25/01 Job: [id number]

## 2011-01-06 NOTE — Procedures (Signed)
[place]. [facility]  Patient:    Dana Blackwell, Dana Blackwell [facility] Visit Number: [id number] MRN: [id number]          Service Type: END Location: ENDO Attending Physician:  Charna Elizabeth Dictated by:   Anselmo Rod, M.D. Proc. Date: 07/24/01 Admit Date:  07/24/2001   CC:         Feliciana Rossetti, M.D.   Procedure Report  DATE OF BIRTH:  October 03, 1927  REFERRING PHYSICIAN:  Feliciana Rossetti, M.D.  PROCEDURE PERFORMED:  Esophagogastroduodenoscopy.  ENDOSCOPIST:  Anselmo Rod, M.D.  INSTRUMENT USED:  Olympus video panendoscope.  INDICATIONS FOR PROCEDURE:  Blood in stool and history of iron deficiency anemia in a 73 white female, rule out peptic ulcer disease, esophagitis, gastritis, etc.  PREPROCEDURE PREPARATION:  Informed consent was procured from the patient. The patient was fasted for eight hours prior to the procedure.  PREPROCEDURE PHYSICAL:  The patient had stable vital signs.  Neck supple. Chest clear to auscultation.  Abdomen soft with normal bowel sounds.  No masses palpable.  DESCRIPTION OF PROCEDURE:  The patient was placed in left lateral decubitus position and sedated with 50 mg of Demerol and 4 mg of Versed intravenously. Once the patient was adequately sedated and maintained on low-flow oxygen and continuous cardiac monitoring, the Olympus video panendoscope was advanced through the mouthpiece, over the tongue, into the esophagus under direct vision.  The entire esophagus appeared normal without lesions.  There was mild patchy gastritis in the proximal half of the stomach.  The rest of the distal gastric mucosa in the proximal small bowel appeared normal.  IMPRESSION:  Normal esophagogastroduodenoscopy except for mild patchy gastritis in the proximal half of the stomach.  RECOMMENDATION:  Proceed with colonoscopy at this time.  Further recommendations will be made once the colonoscopy has been done. Dictated by:   Anselmo Rod,  M.D. Attending Physician:  Charna Elizabeth DD:  07/24/01 TD:  07/24/01 Job: [id number]

## 2011-01-06 NOTE — Consult Note (Signed)
[place]  Patient:    Dana Blackwell, Dana Blackwell [facility] Visit Number: [id number] MRN: [id number]          Service Type: MED Location: 2000 2002 01 Attending Physician:  Learta Codding Dictated by:   Heath Gold, M.D. Proc. Date: 05/28/01 Admit Date:  05/24/2001   CC:         [facility]  Tinnie Gens C. Quintella Reichert, M.D.   Consultation Report  DATE OF BIRTH:  07-14-1928  PRIMARY PHYSICIAN:  Feliciana Rossetti, M.D.  CARDIOLOGISTS: 1. Dr. Riley Kill, [place]. 2. Dr. Andee Lineman, [place].  REASON FOR CONSULTATION:  Heme-positive stool and anemia.  HISTORY OF PRESENT ILLNESS:  This is a 77 year old female who was admitted to the cardiology service on May 23, 2001 with complaint of substernal chest pain.  She was found to have an acute MI.  Cardiac catheterization on October 4 demonstrated multivessel disease with severe disease only noted in the OM.  It was felt that she should be medically managed.  In the process, she was also found to be heme positive on 1/3 stool samples with an anemia of 10.4.  The patient has noted "dark stools" intermittently for many years that she has attributed to food intake.  No black stools.  Also has noted 25-year history of "gas attacks" that last approximately 30 minutes.  This last one ended up being her heart attack.  She denies nausea, vomiting, or diarrhea. She does have chronic constipation and does use a baby aspirin daily for the past 20 years.  She denies NSAID use.  She has had intermittent GERD-like symptoms and occasional dysphagia symptoms with both solids and liquids that has not been getting any worse over the past several years.  No weight loss. She has a history of hemorrhoids and does note some occasional bright red blood per rectum.  Positive frequent bruising.  No hematuria.  The patient states that she had an upper GI series done 15 years ago showing a hiatal hernia.  She also underwent colonoscopy four  years ago by Dr. Loreta Ave in December 1998 that demonstrated internal hemorrhoids only.  PAST MEDICAL HISTORY:  1. Diabetes mellitus type 2 x 20 years.  2. Hypertension x 15 years.  3. Hypothyroidism.  4. Status post TAH in July 1998 for uterine cancer.  5. Status post bladder tacking.  6. Status post open cholecystectomy 15 years ago.  7. BTL.  8. NSVD x 4.  9. Back surgery 25 years ago. 10. Possible hiatal hernia. 11. Her last Pap was approximately six months ago.  MEDICATIONS:  1. Diovan/HCT 160/12.5.  2. Premarin 0.625.  3. K-Dur 40 mEq.  4. Aciphex 20 mg.  5. Aspirin 81 mg q.d.  6. Frequent Maalox and Mylanta.  7. Verapamil 240 mg q.d.  8. Glucovance 2.5/500 mg t.i.d.  9. Calcium carbonate D. 10. Synthroid 0.075 mg q.d. 11. Metamucil q.d.  ALLERGIES:  None.  SOCIAL HISTORY:  No tobacco, alcohol, or drug use.  She is retired from the [employer], [employer].  She is divorced with four children and lives alone.  FAMILY HISTORY:  Dad died at 47 of insulin-dependent diabetes mellitus complications.  Mom died at 58 of coronary artery disease.  Brother died of complications from IDDM.  All siblings have diabetes.  There is no history of cancer or bleeding disorder or peptic ulcer disease.  PHYSICAL EXAMINATION:  VITAL SIGNS:  Temperature 98.8, pulse 78, blood pressure 130/70, respirations 16.  O2 saturations 95% on room air.  GENERAL:  She is well appearing.  Alert, pleasant, in no distress.  HEENT:  Sclerae anicteric, OP without lesion.  Mucous membranes moist.  NECK:  Supple.  No LAD, no JVD.  CARDIOVASCULAR:  RRR.  No M/R/G.  LUNGS:  CTAB.  ABDOMEN:  Soft, NT, ND.  Positive BS.  Obese.  No HSM.  Left groin with large hematoma.  RECTAL:  Small, tender, external hemorrhoids.  Yellow stool in vault.  Heme negative.  LABORATORY DATA:  White blood cell count on admission 15.3 with a hemoglobin of 12.9, hematocrit 37.9, platelets 429.  Today, her white blood cell count  is 11.8, hemoglobin 9.4, hematocrit 27.5, platelets 410 with an MCV of 85.5 and an RDW of 13.4.  Back in August, her hemoglobin was 12 with a hematocrit of 35.9.  Sodium 139, potassium 4.3, chloride 100, bicarb 25, BUN 20, creatinine 1.1, glucose 231, total bilirubin 0.3, SGOT 37, SGPT 22, alkaline phosphatase 48, total protein 7.4, albumin 3.8, calcium 10.1.  PT 13.9, INR 1.1, PTT 53. TSH 1.559.  Hemoglobin A1c 8.2.  Chest x-ray shows right lower lobe atelectasis.  ASSESSMENT AND PLAN:  This is a 77 year old white female admitted for acute MI found to be heme positive with anemia.  In light of her drop in hemoglobin and history of dark stools with dysphagia, would recommend EGD to further evaluate; however, in light of her recent MI, we will hold off on endoscopy for several weeks.  Recommend close monitoring of her CBC, continue Protonix 40 mg p.o. q.d., and have the patient follow up with Dr. Loreta Ave in the office in three weeks.  Feel free to call us if we can be of any further assistance while she is here in the hospital. Dictated by:   Heath Gold, M.D. Attending Physician:  Learta Codding DD:  05/28/01 TD:  05/28/01 Job: [id number]

## 2011-01-06 NOTE — Cardiovascular Report (Signed)
Dana Blackwell, Dana Blackwell                  ACCOUNT NO.:  [id number]   MEDICAL RECORD NO.:  [id number]                   PATIENT TYPE:  OIB   LOCATION:  6501                                 FACILITY:  [facility]   PHYSICIAN:  Carole Binning, M.D. [facility]         DATE OF BIRTH:  08/23/1927   DATE OF PROCEDURE:  DATE OF DISCHARGE:                              CARDIAC CATHETERIZATION   PROCEDURE PERFORMED:  Left heart catheterization with coronary angiography  and left ventriculography.   INDICATION:  [name] is a 77 year old woman with history of  coronary artery disease.  She presented to the office with symptoms of  progressive chest and left shoulder pain.  She was referred for cardiac  catheterization.   CATHETERIZATION PROCEDURAL NOTE:  A 4 French sheath was placed in the right  femoral artery.  Left coronary angiography was performed initially with a 6  [place] JL-4 catheter.  We then used a JL-3.5 catheter to better visualize  the left anterior descending artery.  The right coronary artery was imaged  with a JR-4 catheter.  Left ventriculography was performed with an angled  pigtail catheter.  Contrast was Omnipaque.  There were no complications.   CATHETERIZATION RESULTS:   HEMODYNAMICS:  1. Left ventricular pressure 168/17.  2. Aortic pressure 172/76.  3. There is no aortic valve gradient.   LEFT VENTRICULOGRAM:  Wall motion is normal.  Ejection fraction estimated at  greater than or equal to 60%.  There is no mitral regurgitation.   CORONARY ARTERIOGRAPHY (LEFT DOMINANT):  Left main is normal.   Left anterior descending artery has a 30% stenosis in the proximal vessel  and the mid vessel there is a 60% stenosis in a very tortuous segment of  artery.  The apical LAD has a 95% stenosis.  The LAD gives rise to a normal  size diagonal branch which has tandem 75% stenoses.   Left circumflex is a dominant vessel.  It gives rise to a very small first  obtuse  marginal branch which has an 80-90% stenosis at its origin.  There is  a large second obtuse marginal branch which has a tubular 75% stenosis at  its origin. The mid circumflex just beyond the marginal branch has a 30%  stenosis.  The distal circumflex gives rise to a small first posterior  lateral branch which has an 80% stenosis at its origin.  There is a normal  size second posterior lateral branch and a small third posterior lateral  branch.  There is a normal size posterior descending artery arising from the  distal circumflex.  In the distal circumflex, there is a tubular 75-90%  stenosis extending into the posterior descending artery.  This area is  somewhat difficult to image due to patient's body size.   Right coronary artery is a small nondominant vessel.  There is an 80%  stenosis in the proximal vessel.   IMPRESSION:  1. Normal left ventricular systolic function.  2. Moderate three-vessel  coronary artery disease with diffuse branch disease     as described.  In comparison to films from 2002, there does appear to be     progression of disease in the distal left circumflex extending into the     posterior descending artery.  The disease in the obtuse marginal branch     as well as in the diagonal branch and left anterior descending artery as     well as in the nondominant right coronary artery all appear to be stable.   PLAN:  The patient will be managed medically at this point. We will proceed  with an outpatient adenosine Cardiolite to rule out significant ischemia.  If there is significant ischemia seen on this study then we will review the  case and consider possible options of percutaneous coronary intervention.                                               Carole Binning, M.D. [facility]    MWP/MEDQ  D:  10/06/2003  T:  10/06/2003  Job:  [id number]   cc:   Learta Codding, M.D.   Gaspar Garbe, M.D.  [address]  [place]  [place] [address]  Fax: [phone]

## 2011-01-17 ENCOUNTER — Encounter: Payer: Self-pay | Admitting: Cardiovascular Disease

## 2011-01-17 NOTE — Assessment & Plan Note (Signed)
Blood pressure is well controlled on current medical program. 

## 2011-01-17 NOTE — Assessment & Plan Note (Signed)
The patient is stable without angina. She is on appropriate medical therapy which includes a statin, aspirin, long-acting nitrate, ACE inhibitor, and beta blocker. Will continue her current medical program without changes.

## 2011-01-17 NOTE — Progress Notes (Signed)
HPI:  This is an 77 year old woman presenting for followup evaluation. The patient has moderate three-vessel coronary disease and has been treated medically over the past several years. She has not required revascularization. The patient has done well without angina. Her last nuclear stress test was in November 2011 and it demonstrated no ischemia. Gated left ventricular ejection fraction was 75%.  The patient is doing well from a symptomatic perspective. She specifically denies chest pain or pressure, dyspnea, edema, palpitations, lightheadedness, or syncope.  Outpatient Encounter Prescriptions as of 01/04/2011  Medication Sig Dispense Refill  . amLODipine (NORVASC) 10 MG tablet Take 10 mg by mouth daily.        Marland Kitchen aspirin 325 MG EC tablet Take 325 mg by mouth daily.        . benazepril (LOTENSIN) 20 MG tablet Take 20 mg by mouth daily.        . beta carotene w/minerals (OCUVITE) tablet Take 1 tablet by mouth daily.        . folic acid-pyridoxine-cyancobalamin (FOLTX) 2.5-25-2 MG TABS Take 1 tablet by mouth daily.        . furosemide (LASIX) 40 MG tablet Take 40 mg by mouth daily.        . hydrocodone-acetaminophen (LORCET-HD) 5-500 MG per capsule Take 1 capsule by mouth every 6 (six) hours as needed.        . insulin detemir (LEVEMIR FLEXPEN) 100 UNIT/ML injection Inject 10 Units into the skin at bedtime.        . insulin lispro (HUMALOG) 100 UNIT/ML injection Inject 5 Units into the skin 3 (three) times daily before meals.        . isosorbide dinitrate (ISORDIL) 20 MG tablet Take 40 mg by mouth 2 (two) times daily.        Marland Kitchen KOMBIGLYZE XR 5-500 MG TB24 1 tablet daily.       Marland Kitchen levothyroxine (SYNTHROID, LEVOTHROID) 75 MCG tablet Take 75 mcg by mouth daily.        . metoprolol (TOPROL-XL) 100 MG 24 hr tablet Take 100 mg by mouth daily.        . pantoprazole (PROTONIX) 40 MG tablet Take 40 mg by mouth daily.        . rosuvastatin (CRESTOR) 10 MG tablet Take 10 mg by mouth daily.        . sertraline  (ZOLOFT) 100 MG tablet Take 50 mg by mouth daily.        Marland Kitchen DISCONTD: glyBURIDE-metformin (GLUCOVANCE) 5-500 MG per tablet Take 1 tablet by mouth 2 (two) times daily with a meal.          Allergies  Allergen Reactions  . Tetanus Toxoid     Past Medical History  Diagnosis Date  . Coronary artery disease   . Hypertension   . Hyperlipidemia   . Stroke     hx of  . Obesity   . Heart murmur   . Diabetes mellitus     type II  . Hypothyroidism   . Esophageal stricture   . Gastritis   . Nausea and vomiting   . Diarrhea   . Abdominal pain   . Esophageal reflux   . Depression   . Uterine cancer   . H/O: hysterectomy     ROS: Positive for lower back and bilateral hip pain, otherwise negative except as per HPI  BP 122/52  Pulse 62  Ht 4\' 10"  (1.473 m)  Wt 150 lb 6.4 oz (68.221 kg)  BMI 31.43  kg/m2  PHYSICAL EXAM: Pt is alert and oriented, NAD HEENT: normal Neck: JVP - normal, carotids 2+= without bruits Lungs: CTA bilaterally CV: RRR without murmur or gallop Abd: soft, NT, Positive BS, no hepatomegaly Ext: no C/C/E, distal pulses intact and equal Skin: warm/dry no rash  EKG:  Normal sinus rhythm, heart rate 62 beats per minute, EKG within normal limits.  ASSESSMENT AND PLAN:

## 2011-01-17 NOTE — Assessment & Plan Note (Signed)
Lipids a goal of LDL less than 70. Patient is on statin therapy with Crestor.

## 2011-05-30 ENCOUNTER — Encounter: Payer: PRIVATE HEALTH INSURANCE | Admitting: *Deleted

## 2011-06-14 ENCOUNTER — Encounter (INDEPENDENT_AMBULATORY_CARE_PROVIDER_SITE_OTHER): Payer: PRIVATE HEALTH INSURANCE | Admitting: Ophthalmology

## 2011-06-16 ENCOUNTER — Encounter: Payer: PRIVATE HEALTH INSURANCE | Admitting: *Deleted

## 2011-06-19 ENCOUNTER — Encounter (INDEPENDENT_AMBULATORY_CARE_PROVIDER_SITE_OTHER): Payer: PRIVATE HEALTH INSURANCE | Admitting: *Deleted

## 2011-06-19 DIAGNOSIS — I6529 Occlusion and stenosis of unspecified carotid artery: Secondary | ICD-10-CM

## 2011-06-19 DIAGNOSIS — I2510 Atherosclerotic heart disease of native coronary artery without angina pectoris: Secondary | ICD-10-CM

## 2011-06-28 ENCOUNTER — Encounter (INDEPENDENT_AMBULATORY_CARE_PROVIDER_SITE_OTHER): Payer: PRIVATE HEALTH INSURANCE | Admitting: Ophthalmology

## 2011-06-28 DIAGNOSIS — E1139 Type 2 diabetes mellitus with other diabetic ophthalmic complication: Secondary | ICD-10-CM

## 2011-06-28 DIAGNOSIS — E11319 Type 2 diabetes mellitus with unspecified diabetic retinopathy without macular edema: Secondary | ICD-10-CM

## 2011-06-28 DIAGNOSIS — H43819 Vitreous degeneration, unspecified eye: Secondary | ICD-10-CM

## 2011-06-28 DIAGNOSIS — E1165 Type 2 diabetes mellitus with hyperglycemia: Secondary | ICD-10-CM

## 2012-01-09 ENCOUNTER — Encounter: Payer: Self-pay | Admitting: Cardiovascular Disease

## 2012-01-09 ENCOUNTER — Ambulatory Visit (INDEPENDENT_AMBULATORY_CARE_PROVIDER_SITE_OTHER): Payer: Medicare Other | Admitting: Cardiovascular Disease

## 2012-01-09 VITALS — BP 138/60 | HR 66 | Ht <= 58 in | Wt 149.8 lb

## 2012-01-09 DIAGNOSIS — I2510 Atherosclerotic heart disease of native coronary artery without angina pectoris: Secondary | ICD-10-CM

## 2012-01-09 NOTE — Patient Instructions (Signed)
Your physician wants you to follow-up in: 1 YEAR with Dr Cooper.  You will receive a reminder letter in the mail two months in advance. If you don't receive a letter, please call our office to schedule the follow-up appointment.  Your physician recommends that you continue on your current medications as directed. Please refer to the Current Medication list given to you today.  

## 2012-01-09 NOTE — Progress Notes (Signed)
HPI:  78 year old woman presented for followup evaluation. The patient is followed for moderate three-vessel coronary artery disease, managed medically. Her cardiac catheterization last done in 2005 demonstrated moderate stenosis of the mid LAD, severe stenosis of the apical portion of the LAD, and moderate to severe stenoses in the distal circumflex. Much of this disease was stable from a prior catheterization in 2002.  The patient has no chest pain or pressure. She denies dyspnea, edema, or palpitations. She is primarily limited by back and neck pain related to degenerative arthritis.  Outpatient Encounter Prescriptions as of 01/09/2012  Medication Sig Dispense Refill  . acetaminophen (TYLENOL) 650 MG CR tablet Take 650 mg by mouth every 8 (eight) hours as needed.      Marland Kitchen aspirin 325 MG EC tablet Take 325 mg by mouth daily.        Marland Kitchen atorvastatin (LIPITOR) 20 MG tablet Take 20 mg by mouth daily.      . beta carotene w/minerals (OCUVITE) tablet Take 1 tablet by mouth daily.        . fish oil-omega-3 fatty acids 1000 MG capsule Take 435 mg by mouth daily.      . folic acid-pyridoxine-cyancobalamin (FOLTX) 2.5-25-2 MG TABS Take 1 tablet by mouth daily.        . furosemide (LASIX) 40 MG tablet Take 20 mg by mouth daily.       Marland Kitchen glyBURIDE (DIABETA) 5 MG tablet Take 5 mg by mouth daily with breakfast.      . hydrocodone-acetaminophen (LORCET-HD) 5-500 MG per capsule Take 1 capsule by mouth every 6 (six) hours as needed.        . insulin lispro (HUMALOG) 100 UNIT/ML injection Inject 12 Units into the skin at bedtime.       . isosorbide dinitrate (ISORDIL) 20 MG tablet Take 20 mg by mouth 2 (two) times daily.       Marland Kitchen levothyroxine (SYNTHROID, LEVOTHROID) 75 MCG tablet Take 75 mcg by mouth daily.        . metFORMIN (GLUCOPHAGE) 500 MG tablet Take 500 mg by mouth daily.      . metoprolol (TOPROL-XL) 100 MG 24 hr tablet Take 100 mg by mouth daily.        . nitrofurantoin (MACRODANTIN) 100 MG capsule Take  100 mg by mouth daily.      . pantoprazole (PROTONIX) 40 MG tablet Take 40 mg by mouth daily.        . predniSONE (STERAPRED UNI-PAK) 10 MG tablet Take 5 mg by mouth daily.      . sertraline (ZOLOFT) 100 MG tablet Take 50 mg by mouth daily.        Marland Kitchen DISCONTD: amLODipine (NORVASC) 10 MG tablet Take 10 mg by mouth daily.        Marland Kitchen DISCONTD: benazepril (LOTENSIN) 20 MG tablet Take 20 mg by mouth daily.        Marland Kitchen DISCONTD: insulin detemir (LEVEMIR FLEXPEN) 100 UNIT/ML injection Inject 10 Units into the skin at bedtime.        Marland Kitchen DISCONTD: KOMBIGLYZE XR 5-500 MG TB24 1 tablet daily.       Marland Kitchen DISCONTD: rosuvastatin (CRESTOR) 10 MG tablet Take 10 mg by mouth daily.          Allergies  Allergen Reactions  . Tetanus Toxoid     Past Medical History  Diagnosis Date  . Coronary artery disease   . Hypertension   . Hyperlipidemia   . Stroke  hx of  . Obesity   . Heart murmur   . Diabetes mellitus     type II  . Hypothyroidism   . Esophageal stricture   . Gastritis   . Nausea and vomiting   . Diarrhea   . Abdominal pain   . Esophageal reflux   . Depression   . Uterine cancer   . H/O: hysterectomy     ROS: Negative except as per HPI  BP 138/60  Pulse 66  Ht 4\' 10"  (1.473 m)  Wt 67.949 kg (149 lb 12.8 oz)  BMI 31.31 kg/m2  PHYSICAL EXAM: Pt is alert and oriented, pleasant elderly woman in NAD HEENT: normal Neck: JVP - normal, carotids 2+= with bilateral bruits right greater than left Lungs: CTA bilaterally CV: RRR without murmur or gallop Abd: soft, NT, Positive BS, no hepatomegaly Ext: no C/C/E, distal pulses intact and equal Skin: warm/dry no rash  EKG:  Normal sinus rhythm 66 beats per minute, cannot rule out anterior infarct age undetermined.  ASSESSMENT AND PLAN: 1. Coronary artery disease, native vessel. The patient is stable without angina. Her electrocardiogram is unchanged. She is on appropriate medication with aspirin, atorvastatin, Isordil, and a beta blocker. She  will followup in 12 months.  2. Hypertension. Blood pressure is well-controlled on current program.  3. Hyperlipidemia. Lipids are followed by Dr. Selena Batten. She is appropriately on a statin drug.  4. Carotid artery stenosis. Most recent duplex scan from 2012 showed 40-59% ICA stenosis. She will followup at one year intervals.  Tonny Bollman 01/09/2012 3:26 PM

## 2012-06-27 ENCOUNTER — Other Ambulatory Visit: Payer: Self-pay | Admitting: *Deleted

## 2012-06-27 DIAGNOSIS — I6529 Occlusion and stenosis of unspecified carotid artery: Secondary | ICD-10-CM

## 2012-06-28 ENCOUNTER — Encounter (INDEPENDENT_AMBULATORY_CARE_PROVIDER_SITE_OTHER): Payer: PRIVATE HEALTH INSURANCE | Admitting: Ophthalmology

## 2012-07-02 ENCOUNTER — Encounter (INDEPENDENT_AMBULATORY_CARE_PROVIDER_SITE_OTHER): Payer: Medicare Other

## 2012-07-02 DIAGNOSIS — I6529 Occlusion and stenosis of unspecified carotid artery: Secondary | ICD-10-CM

## 2012-07-10 ENCOUNTER — Encounter (INDEPENDENT_AMBULATORY_CARE_PROVIDER_SITE_OTHER): Payer: Medicare Other | Admitting: Ophthalmology

## 2012-07-10 DIAGNOSIS — I10 Essential (primary) hypertension: Secondary | ICD-10-CM

## 2012-07-10 DIAGNOSIS — E1165 Type 2 diabetes mellitus with hyperglycemia: Secondary | ICD-10-CM

## 2012-07-10 DIAGNOSIS — H35039 Hypertensive retinopathy, unspecified eye: Secondary | ICD-10-CM

## 2012-07-10 DIAGNOSIS — E11319 Type 2 diabetes mellitus with unspecified diabetic retinopathy without macular edema: Secondary | ICD-10-CM

## 2012-07-10 DIAGNOSIS — H43819 Vitreous degeneration, unspecified eye: Secondary | ICD-10-CM

## 2012-07-10 DIAGNOSIS — H3410 Central retinal artery occlusion, unspecified eye: Secondary | ICD-10-CM

## 2012-12-31 ENCOUNTER — Encounter: Payer: Self-pay | Admitting: Cardiovascular Disease

## 2012-12-31 ENCOUNTER — Ambulatory Visit (INDEPENDENT_AMBULATORY_CARE_PROVIDER_SITE_OTHER): Payer: Medicare Other | Admitting: Cardiovascular Disease

## 2012-12-31 VITALS — BP 132/64 | HR 70 | Ht <= 58 in | Wt 155.0 lb

## 2012-12-31 DIAGNOSIS — I2510 Atherosclerotic heart disease of native coronary artery without angina pectoris: Secondary | ICD-10-CM

## 2012-12-31 NOTE — Patient Instructions (Signed)
Your physician wants you to follow-up in: 1 YEAR with Dr Cooper.  You will receive a reminder letter in the mail two months in advance. If you don't receive a letter, please call our office to schedule the follow-up appointment.  Your physician recommends that you continue on your current medications as directed. Please refer to the Current Medication list given to you today.  

## 2012-12-31 NOTE — Progress Notes (Signed)
HPI:  79 year old woman presenting for followup evaluation. The patient has moderate three-vessel coronary disease managed medically. She has undergone cardiac catheterization procedures in 2002 and again in 2005. She's been stable without history of myocardial infarction or acute coronary syndrome. She's followed for carotid stenosis with 40-59% stenosis on the left and less than 40% stenosis on the right. Her last duplex scan was in November 2013.  The patient is having a lot of problems with her back and spine. She tells me she has arthritis from the neck to the lower back. She has pain that radiates through her hips and legs. It makes it difficult for her to do much. She denies chest pain or pressure, exertional dyspnea, orthopnea, PND, or palpitations. She's tolerating her medications well. Her diabetes is much better controlled after some recent changes made by Dr. Selena Batten.  Outpatient Encounter Prescriptions as of 12/31/2012  Medication Sig Dispense Refill  . acetaminophen (TYLENOL) 650 MG CR tablet Take 650 mg by mouth every 8 (eight) hours as needed.      Marland Kitchen aspirin 325 MG EC tablet Take 325 mg by mouth daily.        Marland Kitchen atorvastatin (LIPITOR) 20 MG tablet Take 20 mg by mouth daily.      . beta carotene w/minerals (OCUVITE) tablet Take 1 tablet by mouth daily.        . fish oil-omega-3 fatty acids 1000 MG capsule Take 435 mg by mouth daily.      . folic acid-pyridoxine-cyancobalamin (FOLTX) 2.5-25-2 MG TABS Take 1 tablet by mouth daily.        . furosemide (LASIX) 40 MG tablet Take 20 mg by mouth daily.       Marland Kitchen glyBURIDE (DIABETA) 5 MG tablet Take 5 mg by mouth daily with breakfast.      . hydrocodone-acetaminophen (LORCET-HD) 5-500 MG per capsule Take 1 capsule by mouth every 6 (six) hours as needed.        . insulin lispro (HUMALOG) 100 UNIT/ML injection Inject 12 Units into the skin at bedtime.       . isosorbide dinitrate (ISORDIL) 20 MG tablet Take 20 mg by mouth 2 (two) times daily.         Marland Kitchen levothyroxine (SYNTHROID, LEVOTHROID) 75 MCG tablet Take 75 mcg by mouth daily.        . metFORMIN (GLUCOPHAGE) 500 MG tablet Take 500 mg by mouth daily.      . metoprolol (TOPROL-XL) 100 MG 24 hr tablet Take 100 mg by mouth daily.        . nitrofurantoin (MACRODANTIN) 100 MG capsule Take 100 mg by mouth daily.      . pantoprazole (PROTONIX) 40 MG tablet Take 40 mg by mouth daily.        . predniSONE (STERAPRED UNI-PAK) 10 MG tablet Take 5 mg by mouth daily.      . sertraline (ZOLOFT) 100 MG tablet Take 50 mg by mouth daily.         No facility-administered encounter medications on file as of 12/31/2012.    Allergies  Allergen Reactions  . Tetanus Toxoid     Past Medical History  Diagnosis Date  . Coronary artery disease   . Hypertension   . Hyperlipidemia   . Stroke     hx of  . Obesity   . Heart murmur   . Diabetes mellitus     type II  . Hypothyroidism   . Esophageal stricture   . Gastritis   .  Nausea and vomiting   . Diarrhea   . Abdominal pain   . Esophageal reflux   . Depression   . Uterine cancer   . H/O: hysterectomy     ROS: Negative except as per HPI  BP 132/64  Pulse 70  Ht 4\' 10"  (1.473 m)  Wt 70.308 kg (155 lb)  BMI 32.4 kg/m2  SpO2 90%  PHYSICAL EXAM: Pt is alert and oriented, delightful elderly woman in NAD HEENT: normal Neck: JVP - normal, carotids 2+= without bruits Lungs: CTA bilaterally CV: RRR without murmur or gallop Abd: soft, NT, Positive BS, no hepatomegaly, obese Ext: no C/C/E, distal pulses intact and equal Skin: warm/dry no rash  EKG:  Normal sinus rhythm with sinus arrhythmia, heart rate 70 beats per minute, minimal voltage criteria for LVH maybe normal variant, otherwise within normal limits.  ASSESSMENT AND PLAN: 1. Coronary artery disease, native vessel. The patient remains stable. Her medications were reviewed and she will continue without changes. She's on aspirin for antiplatelet therapy, a statin drug, and a beta  blocker.  2. Hypertension. Blood pressure is under ideal control. She used to be on an ACE inhibitor but this is been discontinued. I'm not sure of the details. In any event, her blood pressure is very well-controlled and she's not had any coronary event so uncomfortable keeping her on her same medical program.  3. Hyperlipidemia. Patient is maintained on Lipitor 20 mg daily, fish oil. Lipids are followed by Dr Selena Batten.  Tonny Bollman 12/31/2012 2:26 PM

## 2013-07-11 ENCOUNTER — Ambulatory Visit (INDEPENDENT_AMBULATORY_CARE_PROVIDER_SITE_OTHER): Payer: Medicare Other | Admitting: Ophthalmology

## 2013-07-16 ENCOUNTER — Ambulatory Visit (HOSPITAL_COMMUNITY): Payer: Medicare Other | Attending: Cardiovascular Disease

## 2013-07-16 DIAGNOSIS — I6529 Occlusion and stenosis of unspecified carotid artery: Secondary | ICD-10-CM | POA: Insufficient documentation

## 2013-08-04 ENCOUNTER — Ambulatory Visit (INDEPENDENT_AMBULATORY_CARE_PROVIDER_SITE_OTHER): Payer: Medicare Other | Admitting: Ophthalmology

## 2013-08-04 DIAGNOSIS — I10 Essential (primary) hypertension: Secondary | ICD-10-CM

## 2013-08-04 DIAGNOSIS — H43819 Vitreous degeneration, unspecified eye: Secondary | ICD-10-CM

## 2013-08-04 DIAGNOSIS — H3410 Central retinal artery occlusion, unspecified eye: Secondary | ICD-10-CM

## 2013-08-04 DIAGNOSIS — H35039 Hypertensive retinopathy, unspecified eye: Secondary | ICD-10-CM

## 2013-08-04 DIAGNOSIS — H3581 Retinal edema: Secondary | ICD-10-CM

## 2013-08-04 DIAGNOSIS — E11319 Type 2 diabetes mellitus with unspecified diabetic retinopathy without macular edema: Secondary | ICD-10-CM

## 2013-08-04 DIAGNOSIS — E1139 Type 2 diabetes mellitus with other diabetic ophthalmic complication: Secondary | ICD-10-CM

## 2013-11-23 ENCOUNTER — Encounter (HOSPITAL_COMMUNITY): Payer: Self-pay | Admitting: Emergency Medicine

## 2013-11-23 ENCOUNTER — Emergency Department (HOSPITAL_COMMUNITY): Payer: Medicare HMO

## 2013-11-23 ENCOUNTER — Inpatient Hospital Stay (HOSPITAL_COMMUNITY)
Admission: EM | Admit: 2013-11-23 | Discharge: 2013-11-28 | DRG: 247 | Disposition: A | Discharge status: Home Health | Payer: Medicare HMO | Attending: Cardiology | Admitting: Cardiology

## 2013-11-23 DIAGNOSIS — Z955 Presence of coronary angioplasty implant and graft: Secondary | ICD-10-CM

## 2013-11-23 DIAGNOSIS — Z794 Long term (current) use of insulin: Secondary | ICD-10-CM

## 2013-11-23 DIAGNOSIS — J4489 Other specified chronic obstructive pulmonary disease: Secondary | ICD-10-CM | POA: Diagnosis present

## 2013-11-23 DIAGNOSIS — Z8542 Personal history of malignant neoplasm of other parts of uterus: Secondary | ICD-10-CM

## 2013-11-23 DIAGNOSIS — E039 Hypothyroidism, unspecified: Secondary | ICD-10-CM | POA: Diagnosis present

## 2013-11-23 DIAGNOSIS — Z7982 Long term (current) use of aspirin: Secondary | ICD-10-CM

## 2013-11-23 DIAGNOSIS — Z8673 Personal history of transient ischemic attack (TIA), and cerebral infarction without residual deficits: Secondary | ICD-10-CM

## 2013-11-23 DIAGNOSIS — J449 Chronic obstructive pulmonary disease, unspecified: Secondary | ICD-10-CM | POA: Diagnosis present

## 2013-11-23 DIAGNOSIS — I2510 Atherosclerotic heart disease of native coronary artery without angina pectoris: Secondary | ICD-10-CM | POA: Diagnosis present

## 2013-11-23 DIAGNOSIS — E119 Type 2 diabetes mellitus without complications: Secondary | ICD-10-CM | POA: Diagnosis present

## 2013-11-23 DIAGNOSIS — Z79899 Other long term (current) drug therapy: Secondary | ICD-10-CM

## 2013-11-23 DIAGNOSIS — I200 Unstable angina: Secondary | ICD-10-CM | POA: Diagnosis present

## 2013-11-23 DIAGNOSIS — I10 Essential (primary) hypertension: Secondary | ICD-10-CM | POA: Diagnosis present

## 2013-11-23 DIAGNOSIS — D649 Anemia, unspecified: Secondary | ICD-10-CM | POA: Diagnosis not present

## 2013-11-23 DIAGNOSIS — K219 Gastro-esophageal reflux disease without esophagitis: Secondary | ICD-10-CM | POA: Diagnosis present

## 2013-11-23 DIAGNOSIS — I6529 Occlusion and stenosis of unspecified carotid artery: Secondary | ICD-10-CM | POA: Diagnosis present

## 2013-11-23 DIAGNOSIS — F3289 Other specified depressive episodes: Secondary | ICD-10-CM | POA: Diagnosis present

## 2013-11-23 DIAGNOSIS — E785 Hyperlipidemia, unspecified: Secondary | ICD-10-CM | POA: Diagnosis present

## 2013-11-23 DIAGNOSIS — E669 Obesity, unspecified: Secondary | ICD-10-CM | POA: Diagnosis present

## 2013-11-23 DIAGNOSIS — F329 Major depressive disorder, single episode, unspecified: Secondary | ICD-10-CM | POA: Diagnosis present

## 2013-11-23 DIAGNOSIS — I214 Non-ST elevation (NSTEMI) myocardial infarction: Principal | ICD-10-CM | POA: Diagnosis present

## 2013-11-23 LAB — I-STAT TROPONIN, ED: Troponin i, poc: 0.04 ng/mL (ref 0.00–0.08)

## 2013-11-23 LAB — BASIC METABOLIC PANEL
BUN: 31 mg/dL — AB (ref 6–23)
CALCIUM: 9.5 mg/dL (ref 8.4–10.5)
CO2: 23 mEq/L (ref 19–32)
CREATININE: 1.39 mg/dL — AB (ref 0.50–1.10)
Chloride: 102 mEq/L (ref 96–112)
GFR calc Af Amer: 39 mL/min — ABNORMAL LOW (ref >90)
GFR, EST NON AFRICAN AMERICAN: 34 mL/min — AB (ref >90)
GLUCOSE: 228 mg/dL — AB (ref 70–99)
Potassium: 4.8 mEq/L (ref 3.7–5.3)
SODIUM: 140 meq/L (ref 137–147)

## 2013-11-23 LAB — CBC
HCT: 39.1 % (ref 36.0–46.0)
HEMOGLOBIN: 12.8 g/dL (ref 12.0–15.0)
MCH: 30.5 pg (ref 26.0–34.0)
MCHC: 32.7 g/dL (ref 30.0–36.0)
MCV: 93.1 fL (ref 78.0–100.0)
Platelets: 320 10*3/uL (ref 150–400)
RBC: 4.2 MIL/uL (ref 3.87–5.11)
RDW: 15.1 % (ref 11.5–15.5)
WBC: 14 10*3/uL — ABNORMAL HIGH (ref 4.0–10.5)

## 2013-11-23 LAB — CBG MONITORING, ED
Glucose-Capillary: 154 mg/dL — ABNORMAL HIGH (ref 70–99)
Glucose-Capillary: 307 mg/dL — ABNORMAL HIGH (ref 70–99)

## 2013-11-23 LAB — HEPARIN LEVEL (UNFRACTIONATED): Heparin Unfractionated: 0.3 IU/mL (ref 0.30–0.70)

## 2013-11-23 LAB — TROPONIN I
Troponin I: <0.3 ng/mL (ref <0.30)
Troponin I: <0.3 ng/mL (ref <0.30)

## 2013-11-23 MED ORDER — ACETAMINOPHEN ER 650 MG PO TBCR: 650.0000 mg | EXTENDED_RELEASE_TABLET | Freq: Every day | ORAL | Status: DC

## 2013-11-23 MED ORDER — NITROGLYCERIN 2 % TD OINT
0.5000 [in_us] | TOPICAL_OINTMENT | Freq: Four times a day (QID) | TRANSDERMAL | Status: DC
  Administered 2013-11-23 – 2013-11-27 (×15): 0.5 [in_us] via TOPICAL
  Filled 2013-11-23 (×4): qty 30
  Filled 2013-11-23: qty 1
  Filled 2013-11-23 (×15): qty 30
  Filled 2013-11-23: qty 1
  Filled 2013-11-23 (×6): qty 30

## 2013-11-23 MED ORDER — FA-PYRIDOXINE-CYANOCOBALAMIN 2.5-25-2 MG PO TABS
1.0000 | ORAL_TABLET | Freq: Every day | ORAL | Status: DC
  Administered 2013-11-24 – 2013-11-28 (×5): 1 via ORAL
  Filled 2013-11-23 (×5): qty 1

## 2013-11-23 MED ORDER — ASPIRIN EC 325 MG PO TBEC
325.0000 mg | DELAYED_RELEASE_TABLET | Freq: Every day | ORAL | Status: DC
  Filled 2013-11-23: qty 1

## 2013-11-23 MED ORDER — ATORVASTATIN CALCIUM 20 MG PO TABS
20.0000 mg | ORAL_TABLET | Freq: Every day | ORAL | Status: DC
  Administered 2013-11-24 – 2013-11-28 (×5): 20 mg via ORAL
  Filled 2013-11-23 (×6): qty 1

## 2013-11-23 MED ORDER — INSULIN ASPART 100 UNIT/ML ~~LOC~~ SOLN
0.0000 [IU] | Freq: Three times a day (TID) | SUBCUTANEOUS | Status: DC
  Administered 2013-11-23: 11 [IU] via SUBCUTANEOUS
  Administered 2013-11-24 – 2013-11-25 (×2): 2 [IU] via SUBCUTANEOUS
  Administered 2013-11-25: 5 [IU] via SUBCUTANEOUS
  Administered 2013-11-25: 3 [IU] via SUBCUTANEOUS
  Administered 2013-11-27: 2 [IU] via SUBCUTANEOUS
  Filled 2013-11-23: qty 1

## 2013-11-23 MED ORDER — HYDROCODONE-ACETAMINOPHEN 5-325 MG PO TABS
1.0000 | ORAL_TABLET | Freq: Four times a day (QID) | ORAL | Status: DC | PRN
  Administered 2013-11-26 – 2013-11-27 (×2): 1 via ORAL
  Filled 2013-11-23 (×2): qty 1

## 2013-11-23 MED ORDER — HEPARIN (PORCINE) IN NACL 100-0.45 UNIT/ML-% IJ SOLN
950.0000 [IU]/h | INTRAMUSCULAR | Status: DC
  Administered 2013-11-23: 850 [IU]/h via INTRAVENOUS
  Administered 2013-11-23: 800 [IU]/h via INTRAVENOUS
  Administered 2013-11-24 (×2): 950 [IU]/h via INTRAVENOUS
  Filled 2013-11-23 (×2): qty 250

## 2013-11-23 MED ORDER — NITROGLYCERIN 0.4 MG SL SUBL
0.4000 mg | SUBLINGUAL_TABLET | SUBLINGUAL | Status: DC | PRN
  Administered 2013-11-26 (×2): 0.4 mg via SUBLINGUAL
  Filled 2013-11-23 (×2): qty 1

## 2013-11-23 MED ORDER — INSULIN ASPART 100 UNIT/ML ~~LOC~~ SOLN
0.0000 [IU] | Freq: Every day | SUBCUTANEOUS | Status: DC
  Administered 2013-11-24: 2 [IU] via SUBCUTANEOUS
  Administered 2013-11-26: 3 [IU] via SUBCUTANEOUS

## 2013-11-23 MED ORDER — HEPARIN BOLUS VIA INFUSION
3500.0000 [IU] | Freq: Once | INTRAVENOUS | Status: AC
  Administered 2013-11-23: 3500 [IU] via INTRAVENOUS
  Filled 2013-11-23: qty 3500

## 2013-11-23 MED ORDER — ACETAMINOPHEN 325 MG PO TABS
650.0000 mg | ORAL_TABLET | ORAL | Status: DC | PRN
  Administered 2013-11-23 – 2013-11-24 (×3): 650 mg via ORAL
  Filled 2013-11-23 (×3): qty 2

## 2013-11-23 MED ORDER — ALPRAZOLAM 0.25 MG PO TABS
0.2500 mg | ORAL_TABLET | Freq: Two times a day (BID) | ORAL | Status: DC | PRN
  Administered 2013-11-25 – 2013-11-26 (×2): 0.25 mg via ORAL
  Filled 2013-11-23 (×2): qty 1

## 2013-11-23 MED ORDER — FUROSEMIDE 20 MG PO TABS
20.0000 mg | ORAL_TABLET | Freq: Every day | ORAL | Status: DC
  Administered 2013-11-25 – 2013-11-28 (×4): 20 mg via ORAL
  Filled 2013-11-23 (×4): qty 1

## 2013-11-23 MED ORDER — SODIUM CHLORIDE 0.9 % IV SOLN
INTRAVENOUS | Status: DC
  Administered 2013-11-24: 11:00:00 via INTRAVENOUS
  Administered 2013-11-24: 1 mL via INTRAVENOUS
  Administered 2013-11-25: 07:00:00 via INTRAVENOUS

## 2013-11-23 MED ORDER — ASPIRIN EC 81 MG PO TBEC: 81.0000 mg | DELAYED_RELEASE_TABLET | Freq: Every day | ORAL | Status: DC

## 2013-11-23 MED ORDER — PANTOPRAZOLE SODIUM 40 MG PO TBEC
40.0000 mg | DELAYED_RELEASE_TABLET | Freq: Every day | ORAL | Status: DC
  Administered 2013-11-24 – 2013-11-28 (×5): 40 mg via ORAL
  Filled 2013-11-23 (×5): qty 1

## 2013-11-23 MED ORDER — GLYBURIDE 5 MG PO TABS
5.0000 mg | ORAL_TABLET | Freq: Every day | ORAL | Status: DC
  Administered 2013-11-25 – 2013-11-27 (×2): 5 mg via ORAL
  Filled 2013-11-23 (×4): qty 1

## 2013-11-23 MED ORDER — NITROFURANTOIN MACROCRYSTAL 100 MG PO CAPS
100.0000 mg | ORAL_CAPSULE | Freq: Every day | ORAL | Status: DC
  Administered 2013-11-24 – 2013-11-25 (×2): 100 mg via ORAL
  Filled 2013-11-23 (×2): qty 1

## 2013-11-23 MED ORDER — INSULIN GLARGINE 100 UNIT/ML ~~LOC~~ SOLN
8.0000 [IU] | Freq: Every day | SUBCUTANEOUS | Status: DC
  Administered 2013-11-24: 8 [IU] via SUBCUTANEOUS
  Administered 2013-11-25: 4 [IU] via SUBCUTANEOUS
  Administered 2013-11-26: 8 [IU] via SUBCUTANEOUS
  Filled 2013-11-23 (×5): qty 0.08

## 2013-11-23 MED ORDER — LEVOTHYROXINE SODIUM 75 MCG PO TABS
75.0000 ug | ORAL_TABLET | Freq: Every day | ORAL | Status: DC
  Administered 2013-11-25 – 2013-11-28 (×4): 75 ug via ORAL
  Filled 2013-11-23 (×6): qty 1

## 2013-11-23 MED ORDER — SERTRALINE HCL 50 MG PO TABS
50.0000 mg | ORAL_TABLET | Freq: Every day | ORAL | Status: DC
  Administered 2013-11-24 – 2013-11-28 (×5): 50 mg via ORAL
  Filled 2013-11-23 (×5): qty 1

## 2013-11-23 MED ORDER — OMEGA-3-ACID ETHYL ESTERS 1 G PO CAPS
1.0000 g | ORAL_CAPSULE | Freq: Every day | ORAL | Status: DC
  Administered 2013-11-24 – 2013-11-28 (×5): 1 g via ORAL
  Filled 2013-11-23 (×5): qty 1

## 2013-11-23 MED ORDER — METOPROLOL SUCCINATE ER 100 MG PO TB24
100.0000 mg | ORAL_TABLET | Freq: Every day | ORAL | Status: DC
  Administered 2013-11-24 – 2013-11-28 (×5): 100 mg via ORAL
  Filled 2013-11-23 (×5): qty 1

## 2013-11-23 MED ORDER — OMEGA-3 FATTY ACIDS 1000 MG PO CAPS: 1.0000 g | ORAL_CAPSULE | Freq: Every day | ORAL | Status: DC

## 2013-11-23 MED ORDER — ONDANSETRON HCL 4 MG/2ML IJ SOLN
4.0000 mg | Freq: Four times a day (QID) | INTRAMUSCULAR | Status: DC | PRN
  Administered 2013-11-26: 4 mg via INTRAVENOUS
  Filled 2013-11-23: qty 2

## 2013-11-23 NOTE — ED Notes (Signed)
Pt given sandwich and drink.

## 2013-11-23 NOTE — Progress Notes (Signed)
ANTICOAGULATION CONSULT NOTE - Initial Consult  Pharmacy Consult for heparin Indication: chest pain/ACS  Allergies  Allergen Reactions  . Tetanus Toxoid     Patient Measurements:   Heparin Dosing Weight: 60kg  Vital Signs: Temp: 98.5 F (36.9 C) (04/05 0857) Temp src: Oral (04/05 0857) BP: 157/58 mmHg (04/05 1240) Pulse Rate: 75 (04/05 1240)  Labs:  Recent Labs  11/23/13 0907  HGB 12.8  HCT 39.1  PLT 320  CREATININE 1.39*    The CrCl is unknown because both a height and weight (above a minimum accepted value) are required for this calculation.   Medical History: Past Medical History  Diagnosis Date  . Coronary artery disease   . Hypertension   . Hyperlipidemia   . Stroke     hx of  . Obesity   . Heart murmur   . Diabetes mellitus     type II  . Hypothyroidism   . Esophageal stricture   . Gastritis   . Nausea and vomiting   . Diarrhea   . Abdominal pain   . Esophageal reflux   . Depression   . Uterine cancer   . H/O: hysterectomy     Medications:  Infusions:  . [START ON 11/24/2013] sodium chloride    . heparin    . heparin      Assessment: 61 yof presented to the ED with CP. To start IV heparin for anticoagulation with plans for possible cardiac cath tomorrow. Baseline H/H and plts are WNL. She is not on any anticoagulation PTA.   Goal of Therapy:  Heparin level 0.3-0.7 units/ml Monitor platelets by anticoagulation protocol: Yes   Plan:  1. Heparin bolus 3500 units IV x 1 2. Heparin gtt 800 units/hr 3. Check an 8 hour heparin level 4. Daily heparin level and CBC 5. F/u cath plans  Bralynn Velador, Rande Lawman 11/23/2013,12:52 PM

## 2013-11-23 NOTE — ED Provider Notes (Signed)
CSN: [id number]     Arrival date & time 11/23/13  [id number] History   First MD Initiated Contact with Patient 11/23/13 [phone]     Chief Complaint  Patient presents with  . Chest Pain     (Consider location/radiation/quality/duration/timing/severity/associated sxs/prior Treatment) HPI Comments: Patient with history of DM, known moderate CAD being monitored without any previous interventions -- presents with complaint of chest pain described as a tightness with radiation to her back and right arm that began at 5:30 AM today while lying in bed. Patient experienced pain for approximately 3 hours. She called the ambulance and the pain resolved upon their arrival and administration of oxygen and nitroglycerin. Patient had nausea and mild shortness of breath with the pain. No diaphoresis or palpitations. Patient states that she has had intermittent chest pressure described as an anxiety feeling with exertion over the past 2 weeks. This resolved in 15-20 minutes with rest. Patient denies abdominal pain. EMS administered aspirin, no nitroglycerin given. The onset of this condition was acute. The course is resolved. Aggravating factors: activity. Alleviating factors: none.   From Dr. Antionette Char note 12/2011: The patient is followed for moderate three-vessel coronary artery disease, managed medically. Her cardiac catheterization last done in 2005 demonstrated moderate stenosis of the mid LAD, severe stenosis of the apical portion of the LAD, and moderate to severe stenoses in the distal circumflex. Much of this disease was stable from a prior catheterization in 2002.    Patient is a 80 y.o. female presenting with chest pain. The history is provided by the patient and medical records.  Chest Pain Associated symptoms: back pain, nausea and shortness of breath   Associated symptoms: no abdominal pain, no cough, no diaphoresis, no fever, no palpitations and not vomiting     Past Medical History  Diagnosis Date  .  Coronary artery disease   . Hypertension   . Hyperlipidemia   . Stroke     hx of  . Obesity   . Heart murmur   . Diabetes mellitus     type II  . Hypothyroidism   . Esophageal stricture   . Gastritis   . Nausea and vomiting   . Diarrhea   . Abdominal pain   . Esophageal reflux   . Depression   . Uterine cancer   . H/O: hysterectomy    Past Surgical History  Procedure Laterality Date  . Bladder surgery      tacking  . Cervical disc surgery    . Cholecystectomy    . Total abdominal hysterectomy     Family History  Problem Relation Age of Onset  . Heart attack Mother 45    died  . Coronary artery disease      family hx on  father side   History  Substance Use Topics  . Smoking status: Never Smoker   . Smokeless tobacco: Not on file  . Alcohol Use: No   OB History   Grav Para Term Preterm Abortions TAB SAB Ect Mult Living                 Review of Systems  Constitutional: Negative for fever and diaphoresis.  Eyes: Negative for redness.  Respiratory: Positive for shortness of breath. Negative for cough.   Cardiovascular: Positive for chest pain. Negative for palpitations and leg swelling.  Gastrointestinal: Positive for nausea. Negative for vomiting and abdominal pain.  Genitourinary: Negative for dysuria.  Musculoskeletal: Positive for back pain. Negative for neck pain.  Skin: Negative  for rash.  Neurological: Negative for syncope and light-headedness.      Allergies  Tetanus toxoid  Home Medications   Current Outpatient Rx  Name  Route  Sig  Dispense  Refill  . acetaminophen (TYLENOL) 650 MG CR tablet   Oral   Take 650 mg by mouth every 8 (eight) hours as needed.         Marland Kitchen aspirin 325 MG EC tablet   Oral   Take 325 mg by mouth daily.           Marland Kitchen atorvastatin (LIPITOR) 20 MG tablet   Oral   Take 20 mg by mouth daily.         . beta carotene w/minerals (OCUVITE) tablet   Oral   Take 1 tablet by mouth daily.           . fish  oil-omega-3 fatty acids 1000 MG capsule   Oral   Take 435 mg by mouth daily.         . folic acid-pyridoxine-cyancobalamin (FOLTX) 2.5-25-2 MG TABS   Oral   Take 1 tablet by mouth daily.           . furosemide (LASIX) 40 MG tablet   Oral   Take 20 mg by mouth daily.          Marland Kitchen glyBURIDE (DIABETA) 5 MG tablet   Oral   Take 5 mg by mouth daily with breakfast.         . hydrocodone-acetaminophen (LORCET-HD) 5-500 MG per capsule   Oral   Take 1 capsule by mouth every 6 (six) hours as needed.           . insulin lispro (HUMALOG) 100 UNIT/ML injection   Subcutaneous   Inject 12 Units into the skin at bedtime.          . isosorbide dinitrate (ISORDIL) 20 MG tablet   Oral   Take 20 mg by mouth 2 (two) times daily.          Marland Kitchen levothyroxine (SYNTHROID, LEVOTHROID) 75 MCG tablet   Oral   Take 75 mcg by mouth daily.           . metFORMIN (GLUCOPHAGE) 500 MG tablet   Oral   Take 500 mg by mouth daily.         . metoprolol (TOPROL-XL) 100 MG 24 hr tablet   Oral   Take 100 mg by mouth daily.           . nitrofurantoin (MACRODANTIN) 100 MG capsule   Oral   Take 100 mg by mouth daily.         . pantoprazole (PROTONIX) 40 MG tablet   Oral   Take 40 mg by mouth daily.           . predniSONE (STERAPRED UNI-PAK) 10 MG tablet   Oral   Take 5 mg by mouth daily.         . sertraline (ZOLOFT) 100 MG tablet   Oral   Take 50 mg by mouth daily.            BP 155/61  Pulse 74  Temp(Src) 98.5 F (36.9 C) (Oral)  Resp 16  SpO2 96% Physical Exam  Nursing note and vitals reviewed. Constitutional: She appears well-developed and well-nourished.  HENT:  Head: Normocephalic and atraumatic.  Mouth/Throat: Mucous membranes are normal. Mucous membranes are not dry.  Eyes: Conjunctivae are normal.  Neck: Trachea normal and normal range of motion.  Neck supple. Normal carotid pulses and no JVD present. No muscular tenderness present. Carotid bruit is not  present. No tracheal deviation present.  Cardiovascular: Normal rate, regular rhythm, S1 normal, S2 normal, normal heart sounds and intact distal pulses.  Exam reveals no decreased pulses.   No murmur heard. Pulses:      Radial pulses are 2+ on the right side, and 2+ on the left side.  Pulmonary/Chest: Effort normal. No respiratory distress. She has no wheezes. She exhibits no tenderness.  Abdominal: Soft. Normal aorta and bowel sounds are normal. There is no tenderness. There is no rebound and no guarding.  Musculoskeletal: Normal range of motion.  Neurological: She is alert.  Skin: Skin is warm and dry. She is not diaphoretic. No cyanosis. No pallor.  Psychiatric: She has a normal mood and affect.    ED Course  Procedures (including critical care time) Labs Review Labs Reviewed  CBC - Abnormal; Notable for the following:    WBC 14.0 (*)    All other components within normal limits  BASIC METABOLIC PANEL - Abnormal; Notable for the following:    Glucose, Bld 228 (*)    BUN 31 (*)    Creatinine, Ser 1.39 (*)    GFR calc non Af Amer 34 (*)    GFR calc Af Amer 39 (*)    All other components within normal limits  Randolm Idol, ED   Imaging Review Dg Chest 2 View  11/23/2013   CLINICAL DATA:  Chest pain.  EXAM: CHEST  2 VIEW  COMPARISON:  CT CHEST W/O CM dated 11/29/2010; DG CHEST 1V PORT dated 05/27/2010  FINDINGS: The lung volumes. Cardiac silhouette within normal limits. Atherosclerotic calcification in the aorta. There is diffuse prominence of the interstitial markings. Areas of increased density projects in the lung bases and there is blunting of the costophrenic angles. Degenerative changes are appreciated within the shoulders and thoracolumbar spine. There is flattening of the hemidiaphragms increased AP diameter of the chest.  IMPRESSION: COPD  Interstitial findings likely reflecting pulmonary fibrosis. Atelectasis versus infiltrate versus scarring within the lung bases left  greater than right. Small bilateral effusions.   Electronically Signed   By: Margaree Mackintosh M.D.   On: 11/23/2013 09:49     EKG Interpretation   Date/Time:  Sunday November 23 2013 08:54:55 EDT Ventricular Rate:  76 PR Interval:  147 QRS Duration: 97 QT Interval:  418 QTC Calculation: 470 R Axis:   3 Text Interpretation:  Sinus rhythm Normal ECG No significant change since  last tracing Confirmed by [facility]  MD, Jenny Reichmann ([id number]) on 11/23/2013 9:02:44 AM      9:13 AM Patient seen and examined. Patient was discussed with/seen by Babette Relic, MD Work-up initiated. Currently pain free.   Vital signs reviewed and are as follows: Filed Vitals:   11/23/13 0857  BP: 155/61  Pulse: 74  Temp: 98.5 F (36.9 C)  Resp: 16   10:13 AM Spoke with Dr. Aundra Dubin who will see. EKG non-ischemic. First POC trop neg. Good story for unstable angina, known CAD.   11:35 AM Patient stable. She did say that she had some mild pain when she walked to the bathroom and back, resolved upon lying back down in the bed and resting.   12:31 PM Cardiology has seen. Will admit, plan cath tomorrow.    MDM   Final diagnoses:  Unstable angina   Admit for CP, known CAD, ? Unstable angina.      Vonna Kotyk  Rondel Oh, PA-C 11/23/13 1232

## 2013-11-23 NOTE — ED Notes (Signed)
Pt returned to exam room. Vital signs stable. Pt resting quietly at the time. No signs of distress noted.

## 2013-11-23 NOTE — ED Notes (Signed)
Pt up to bathroom with assistance. States she feels short of breath on exertion. Respirations unlabored. Speaking complete sentences. Vital signs stable. Pt is alert and oriented x4.

## 2013-11-23 NOTE — ED Provider Notes (Signed)
Medical screening examination/treatment/procedure(s) were conducted as a shared visit with non-physician practitioner(s) and myself.  I personally evaluated the patient during the encounter.   EKG Interpretation   Date/Time:  Sunday November 23 2013 08:54:55 EDT Ventricular Rate:  76 PR Interval:  147 QRS Duration: 97 QT Interval:  418 QTC Calculation: 470 R Axis:   3 Text Interpretation:  Sinus rhythm Normal ECG No significant change since  last tracing Confirmed by Zygmunt Mcglinn  MD, Callaghan Laverdure ([id number]) on 11/23/2013 9:02:44 AM     80  year old female with history of coronary artery disease prior MI without intervention no history of recent anginal symptoms but today had about a 20 minute spell of chest pressure shortness of breath pain radiating down right arm; resolved with oxygen and aspirin did not take nitroglycerin; now asymptomatic.  Babette Relic, MD 11/27/13 [phone]

## 2013-11-23 NOTE — ED Notes (Signed)
Food tray ordered

## 2013-11-23 NOTE — Consult Note (Signed)
Patient ID: Dana Blackwell MRN: [id number], DOB/AGE: 78-Aug-1929   Admit date: 11/23/2013   Primary Physician: [name], MD Primary Cardiologist: Dr Jerilynn Mages. Cooper  HPI: 80 y/o followed by Dr Burt Knack. She has a history of CAD by cath 10 yrs ago- treated medically.  Her last Nuclear study per office note was 2011 and was low risk.  She presents to the ER today with complaints of chest "pressure". She says she has had this doing her usual housework the last two weeks. Her symptoms are associated with a feeling of SOB. Her symptoms are relieved with rest. She has NTG but did not take it because she wasn't " having pain, only pressure". This morning she woke up around 5 am with SSCP  pressure that radiated to her back. Her symptoms waxed and waned till 7 am and she called EMS (she lives alone in an apartment), and presented to the ER for further evaluation.    Problem List: Past Medical History  Diagnosis Date  . Coronary artery disease   . Hypertension   . Hyperlipidemia   . Stroke     hx of  . Obesity   . Heart murmur   . Diabetes mellitus     type II  . Hypothyroidism   . Esophageal stricture   . Gastritis   . Nausea and vomiting   . Diarrhea   . Abdominal pain   . Esophageal reflux   . Depression   . Uterine cancer   . H/O: hysterectomy     Past Surgical History  Procedure Laterality Date  . Bladder surgery      tacking  . Cervical disc surgery    . Cholecystectomy    . Total abdominal hysterectomy       Allergies:  Allergies  Allergen Reactions  . Tetanus Toxoid      Home Medications Current Facility-Administered Medications  Medication Dose Route Frequency Provider Last Rate Last Dose  . [START ON 11/24/2013] 0.9 %  sodium chloride infusion   Intravenous Continuous Luke K Kilroy, PA-C      . acetaminophen (TYLENOL) CR tablet 650 mg  650 mg Oral Daily Luke K Kilroy, PA-C      . acetaminophen (TYLENOL) tablet 650 mg  650 mg Oral Q4H PRN Erlene Quan, PA-C    650 mg at 11/23/13 1247  . ALPRAZolam Duanne Moron) tablet 0.25 mg  0.25 mg Oral BID PRN Erlene Quan, PA-C      . [START ON 11/24/2013] aspirin EC tablet 325 mg  325 mg Oral Daily Luke K Kilroy, [place]      . [START ON 11/24/2013] atorvastatin (LIPITOR) tablet 20 mg  20 mg Oral Daily Luke K Kilroy, PA-C      . [START ON 11/24/2013] fish oil-omega-3 fatty acids capsule 1 g  1 g Oral Daily Luke K Kilroy, [place]      . [START ON 04/27/2951] folic acid-pyridoxine-cyancobalamin (FOLTX) 2.5-25-2 MG per tablet 1 tablet  1 tablet Oral Daily Doreene Burke Kilroy, PA-C      . [START ON 11/25/2013] furosemide (LASIX) tablet 20 mg  20 mg Oral Daily Luke K Kilroy, PA-C      . [START ON 11/25/2013] glyBURIDE (DIABETA) tablet 5 mg  5 mg Oral Q breakfast Luke K Kilroy, PA-C      . heparin ADULT infusion 100 units/mL (25000 units/250 mL)  800 Units/hr Intravenous Continuous Rande Lawman Rumbarger, RPH 8 mL/hr at 11/23/13 1315 800 Units/hr at 11/23/13  1315  . HYDROcodone-acetaminophen (NORCO/VICODIN) 5-325 MG per tablet 1 tablet  1 tablet Oral Q6H PRN Erlene Quan, PA-C      . insulin aspart (novoLOG) injection 0-15 Units  0-15 Units Subcutaneous TID WC Luke K Kilroy, PA-C      . insulin aspart (novoLOG) injection 0-5 Units  0-5 Units Subcutaneous QHS Luke K Kilroy, PA-C      . insulin glargine (LANTUS) injection 8 Units  8 Units Subcutaneous QHS Luke K Kilroy, PA-C      . [START ON 11/24/2013] levothyroxine (SYNTHROID, LEVOTHROID) tablet 75 mcg  75 mcg Oral Daily Luke K Kilroy, PA-C      . [START ON 11/24/2013] metoprolol succinate (TOPROL-XL) 24 hr tablet 100 mg  100 mg Oral Daily Luke K Kilroy, PA-C      . [START ON 11/24/2013] nitrofurantoin (MACRODANTIN) capsule 100 mg  100 mg Oral Daily Luke K Kilroy, PA-C      . nitroGLYCERIN (NITROGLYN) 2 % ointment 0.5 inch  0.5 inch Topical 4 times per day Erlene Quan, PA-C   0.5 inch at 11/23/13 1247  . nitroGLYCERIN (NITROSTAT) SL tablet 0.4 mg  0.4 mg Sublingual Q5 Min x 3 PRN Doreene Burke Kilroy, PA-C       . ondansetron [facility]) injection 4 mg  4 mg Intravenous Q6H PRN Erlene Quan, PA-C      . [START ON 11/24/2013] pantoprazole (PROTONIX) EC tablet 40 mg  40 mg Oral Daily Luke K Kilroy, PA-C      . [START ON 11/24/2013] sertraline (ZOLOFT) tablet 50 mg  50 mg Oral Daily Erlene Quan, PA-C       Current Outpatient Prescriptions  Medication Sig Dispense Refill  . acetaminophen (TYLENOL) 650 MG CR tablet Take 650 mg by mouth daily.       Marland Kitchen aspirin 325 MG EC tablet Take 325 mg by mouth daily.       Marland Kitchen atorvastatin (LIPITOR) 20 MG tablet Take 20 mg by mouth daily.      . beta carotene w/minerals (OCUVITE) tablet Take 1 tablet by mouth daily.       . calcium carbonate (OS-CAL) 600 MG TABS tablet Take 600 mg by mouth daily.      . Cyanocobalamin (VITAMIN B-12 PO) Take 1 tablet by mouth daily.      . fish oil-omega-3 fatty acids 1000 MG capsule Take 435 mg by mouth daily.      . folic acid-pyridoxine-cyancobalamin (FOLTX) 2.5-25-2 MG TABS Take 1 tablet by mouth daily.       . furosemide (LASIX) 40 MG tablet Take 20 mg by mouth daily.       Marland Kitchen glyBURIDE (DIABETA) 5 MG tablet Take 5 mg by mouth daily with breakfast.      . hydrocodone-acetaminophen (LORCET-HD) 5-500 MG per capsule Take 1 capsule by mouth daily.       . insulin glargine (LANTUS) 100 UNIT/ML injection Inject 8 Units into the skin at bedtime.      . insulin lispro (HUMALOG) 100 UNIT/ML injection Inject 4 Units into the skin 3 (three) times daily with meals.       . isosorbide dinitrate (ISORDIL) 20 MG tablet Take 20 mg by mouth 2 (two) times daily.       Marland Kitchen levothyroxine (SYNTHROID, LEVOTHROID) 75 MCG tablet Take 75 mcg by mouth daily.        Marland Kitchen MAGNESIUM PO Take 1 tablet by mouth daily.      . metFORMIN (GLUCOPHAGE)  500 MG tablet Take 500 mg by mouth 2 (two) times daily.       . metoprolol (TOPROL-XL) 100 MG 24 hr tablet Take 100 mg by mouth daily.       . nitrofurantoin (MACRODANTIN) 100 MG capsule Take 100 mg by mouth daily.      .  pantoprazole (PROTONIX) 40 MG tablet Take 40 mg by mouth daily.        . predniSONE (STERAPRED UNI-PAK) 10 MG tablet Take 5 mg by mouth daily.      . sertraline (ZOLOFT) 100 MG tablet Take 50 mg by mouth daily.          Family History  Problem Relation Age of Onset  . Heart attack Mother 58    died  . Coronary artery disease      family hx on  father side     History   Social History  . Marital Status: Divorced    Spouse Name: N/A    Number of Children: N/A  . Years of Education: N/A   Occupational History  . retired    Social History Main Topics  . Smoking status: Never Smoker   . Smokeless tobacco: Not on file  . Alcohol Use: No  . Drug Use: No  . Sexual Activity: Not on file   Other Topics Concern  . Not on file   Social History Narrative  . No narrative on file     Review of Systems: General: negative for chills, fever, night sweats or weight changes.  Cardiovascular: negative for chest pain, dyspnea on exertion, edema, orthopnea, palpitations, paroxysmal nocturnal dyspnea or shortness of breath Dermatological: negative for rash Respiratory: negative for cough or wheezing Urologic: negative for hematuria Abdominal: negative for nausea, vomiting, diarrhea, bright red blood per rectum, melena, or hematemesis Neurologic: negative for visual changes, syncope, or dizziness All other systems reviewed and are otherwise negative except as noted above.  Physical Exam: Blood pressure 157/58, pulse 75, temperature 98.5 F (36.9 C), temperature source Oral, resp. rate 18, SpO2 99.00%.  General appearance: alert, cooperative, no distress and moderately obese Neck: no carotid bruit and no JVD Lungs: clear to auscultation bilaterally Heart: regular rate and rhythm Abdomen: obese, non tender Extremities: extremities normal, atraumatic, no cyanosis or edema Pulses: 2+ and symmetric Skin: Skin color, texture, turgor normal. No rashes or lesions Neurologic: Grossly  normal    Labs:   Results for orders placed during the hospital encounter of 11/23/13 (from the past 24 hour(s))  CBC     Status: Abnormal   Collection Time    11/23/13  9:07 AM      Result Value Ref Range   WBC 14.0 (*) 4.0 - 10.5 K/uL   RBC 4.20  3.87 - 5.11 MIL/uL   Hemoglobin 12.8  12.0 - 15.0 g/dL   HCT 39.1  36.0 - 46.0 %   MCV 93.1  78.0 - 100.0 fL   MCH 30.5  26.0 - 34.0 pg   MCHC 32.7  30.0 - 36.0 g/dL   RDW 15.1  11.5 - 15.5 %   Platelets 320  150 - 400 K/uL  BASIC METABOLIC PANEL     Status: Abnormal   Collection Time    11/23/13  9:07 AM      Result Value Ref Range   Sodium 140  137 - 147 mEq/L   Potassium 4.8  3.7 - 5.3 mEq/L   Chloride 102  96 - 112 mEq/L   CO2 23  19 - 32 mEq/L   Glucose, Bld 228 (*) 70 - 99 mg/dL   BUN 31 (*) 6 - 23 mg/dL   Creatinine, Ser 1.39 (*) 0.50 - 1.10 mg/dL   Calcium 9.5  8.4 - 10.5 mg/dL   GFR calc non Af Amer 34 (*) >90 mL/min   GFR calc Af Amer 39 (*) >90 mL/min  I-STAT TROPOININ, ED     Status: None   Collection Time    11/23/13  9:13 AM      Result Value Ref Range   Troponin i, poc 0.04  0.00 - 0.08 ng/mL   Comment 3           CBG MONITORING, ED     Status: Abnormal   Collection Time    11/23/13 12:51 PM      Result Value Ref Range   Glucose-Capillary 154 (*) 70 - 99 mg/dL     Radiology/Studies: Dg Chest 2 View  11/23/2013   CLINICAL DATA:  Chest pain.  EXAM: CHEST  2 VIEW  COMPARISON:  CT CHEST W/O CM dated 11/29/2010; DG CHEST 1V PORT dated 05/27/2010  FINDINGS: The lung volumes. Cardiac silhouette within normal limits. Atherosclerotic calcification in the aorta. There is diffuse prominence of the interstitial markings. Areas of increased density projects in the lung bases and there is blunting of the costophrenic angles. Degenerative changes are appreciated within the shoulders and thoracolumbar spine. There is flattening of the hemidiaphragms increased AP diameter of the chest.  IMPRESSION: COPD  Interstitial findings  likely reflecting pulmonary fibrosis. Atelectasis versus infiltrate versus scarring within the lung bases left greater than right. Small bilateral effusions.   Electronically Signed   By: Margaree Mackintosh M.D.   On: 11/23/2013 09:49    EKG: NSR without acute changes  ASSESSMENT AND PLAN:  Principal Problem:   Unstable angina Active Problems:   CAD 3V at cath 2006- medical Rx   DM, TYPE II   HYPOTHYROIDISM   HYPERLIPIDEMIA   OBESITY   HYPERTENSION   CAROTID ARTERY DISEASE- moderate LICA 54-65% doppler Nov 2014   PLAN:    Signed, Erlene Quan, PA-C 11/23/2013, 1:25 PM     General: NAD Neck: No JVD, no thyromegaly or thyroid nodule.  Lungs: Dry crackles at bases bilaterally CV: Nondisplaced PMI.  Heart regular S1/S2, no S3/S4, no murmur.  No peripheral edema.  No carotid bruit.  Normal pedal pulses.  Abdomen: Soft, nontender, no hepatosplenomegaly, no distention.  Skin: Intact without lesions or rashes.  Neurologic: Alert and oriented x 3.  Psych: Normal affect. Extremities: No clubbing or cyanosis.  HEENT: Normal.   Assessment/Plan:  Patient seen with PA, agree with the above note.    80 yo with history of moderate CAD on 2005 cath (medically managed), HTN, diabetes, h/o CVA presented with episode of prolonged chest pressure this morning (about 2 hours, resolved when EMS put her on oxygen).  She has also had about 2 wks of chest pressure with activity: walking around house, washing dishes, dressing.  Prior to this, she had not been having chest pain/pressure.  ECG is unremarkable, initial cardiac enzymes negative.  Symptoms sound like unstable angina.  - Would start heparin gtt - Continue ASA, statin, metoprolol - Cycle cardiac enzymes - Given symptoms concerning unstable angina and known CAD, would favor LHC.  Will plan tomorrow.  Will gently hydrate given mildly elevated creatinine.  Loralie Champagne 11/23/2013 3:49 PM

## 2013-11-23 NOTE — ED Notes (Signed)
Cardiology at the bedside to consult.

## 2013-11-23 NOTE — Progress Notes (Addendum)
ANTICOAGULATION CONSULT NOTE - Follow Up Consult  Pharmacy Consult for Heparin  Indication: chest pain/ACS  Allergies  Allergen Reactions  . Tetanus Toxoid     Patient Measurements: Heparin Dosing Weight: 60 kg  Vital Signs: Temp: 98.8 F (37.1 C) (04/05 2321) Temp src: Oral (04/05 2321) BP: 161/62 mmHg (04/05 2255) Pulse Rate: 73 (04/05 2255)  Labs:  Recent Labs  11/23/13 0907 11/23/13 1251 11/23/13 1839 11/23/13 2225  HGB 12.8  --   --   --   HCT 39.1  --   --   --   PLT 320  --   --   --   HEPARINUNFRC  --   --   --  0.30  CREATININE 1.39*  --   --   --   TROPONINI  --  <0.30 <0.30  --     Assessment: 80 y/o F on heparin for CP. HL is 0.3 on 800 units/hr. Other labs as above. No issues per RN. Looks like plan for cath 4/6.   Goal of Therapy:  Heparin level 0.3-0.7 units/ml Monitor platelets by anticoagulation protocol: Yes   Plan:  -Increase slightly to 850 units/hr  -AM HL -Daily CBC/HL -Monitor for bleeding  Narda Bonds 11/23/2013,11:31 PM  Addendum 11/24/2013 2:29 AM Repeat HL 0.28, drawn early -Will go ahead and increase heparin to 950 units/hr -1000 HL JLedford, PharmD

## 2013-11-23 NOTE — ED Notes (Signed)
CBG 307

## 2013-11-23 NOTE — ED Notes (Signed)
Pt presents to department via [facility] for evaluation of diffuse chest pain radiating to R arm. Onset 05:30 this morning. Denies chest pain. Pt is alert and oriented x4. Received 324 ASA per EMS. Pt refused IV. Respirations unlabored. No signs of distress noted upon arrival to ED.

## 2013-11-23 NOTE — ED Notes (Signed)
Pt resting quietly at the time. Vital signs stable, denies chest pain. Respirations unlabored. Pt is alert and oriented x4. No signs of distress noted at present.

## 2013-11-24 ENCOUNTER — Encounter (HOSPITAL_COMMUNITY)
Admission: EM | Disposition: A | Discharge status: Home Health | Payer: Self-pay | Source: Home / Self Care | Attending: Cardiology

## 2013-11-24 DIAGNOSIS — E119 Type 2 diabetes mellitus without complications: Secondary | ICD-10-CM | POA: Diagnosis not present

## 2013-11-24 DIAGNOSIS — I200 Unstable angina: Secondary | ICD-10-CM

## 2013-11-24 DIAGNOSIS — I214 Non-ST elevation (NSTEMI) myocardial infarction: Secondary | ICD-10-CM | POA: Diagnosis not present

## 2013-11-24 DIAGNOSIS — I2510 Atherosclerotic heart disease of native coronary artery without angina pectoris: Secondary | ICD-10-CM

## 2013-11-24 DIAGNOSIS — I6529 Occlusion and stenosis of unspecified carotid artery: Secondary | ICD-10-CM | POA: Diagnosis not present

## 2013-11-24 DIAGNOSIS — J449 Chronic obstructive pulmonary disease, unspecified: Secondary | ICD-10-CM | POA: Diagnosis not present

## 2013-11-24 HISTORY — PX: LEFT HEART CATHETERIZATION WITH CORONARY ANGIOGRAM: SHX5451

## 2013-11-24 LAB — CBC
HCT: 36.4 % (ref 36.0–46.0)
Hemoglobin: 12 g/dL (ref 12.0–15.0)
MCH: 30.3 pg (ref 26.0–34.0)
MCHC: 33 g/dL (ref 30.0–36.0)
MCV: 91.9 fL (ref 78.0–100.0)
Platelets: 316 10*3/uL (ref 150–400)
RBC: 3.96 MIL/uL (ref 3.87–5.11)
RDW: 14.7 % (ref 11.5–15.5)
WBC: 13.4 10*3/uL — ABNORMAL HIGH (ref 4.0–10.5)

## 2013-11-24 LAB — BASIC METABOLIC PANEL
BUN: 26 mg/dL — ABNORMAL HIGH (ref 6–23)
CO2: 24 mEq/L (ref 19–32)
Calcium: 9.7 mg/dL (ref 8.4–10.5)
Chloride: 104 mEq/L (ref 96–112)
Creatinine, Ser: 1.22 mg/dL — ABNORMAL HIGH (ref 0.50–1.10)
GFR calc Af Amer: 46 mL/min — ABNORMAL LOW (ref >90)
GFR calc non Af Amer: 40 mL/min — ABNORMAL LOW (ref >90)
Glucose, Bld: 92 mg/dL (ref 70–99)
Potassium: 4.6 mEq/L (ref 3.7–5.3)
Sodium: 143 mEq/L (ref 137–147)

## 2013-11-24 LAB — GLUCOSE, CAPILLARY
GLUCOSE-CAPILLARY: 138 mg/dL — AB (ref 70–99)
GLUCOSE-CAPILLARY: 99 mg/dL (ref 70–99)
Glucose-Capillary: 63 mg/dL — ABNORMAL LOW (ref 70–99)
Glucose-Capillary: 136 mg/dL — ABNORMAL HIGH (ref 70–99)
Glucose-Capillary: 142 mg/dL — ABNORMAL HIGH (ref 70–99)
Glucose-Capillary: 215 mg/dL — ABNORMAL HIGH (ref 70–99)

## 2013-11-24 LAB — HEPARIN LEVEL (UNFRACTIONATED)
HEPARIN UNFRACTIONATED: 0.28 [IU]/mL — AB (ref 0.30–0.70)
HEPARIN UNFRACTIONATED: 0.4 [IU]/mL (ref 0.30–0.70)

## 2013-11-24 LAB — PROTIME-INR
INR: 1.01 (ref 0.00–1.49)
Prothrombin Time: 13.1 seconds (ref 11.6–15.2)

## 2013-11-24 LAB — MRSA PCR SCREENING: MRSA BY PCR: NEGATIVE

## 2013-11-24 LAB — POCT ACTIVATED CLOTTING TIME: ACTIVATED CLOTTING TIME: 132 s

## 2013-11-24 LAB — TROPONIN I: Troponin I: <0.3 ng/mL (ref <0.30)

## 2013-11-24 SURGERY — LEFT HEART CATHETERIZATION WITH CORONARY ANGIOGRAM
Anesthesia: LOCAL

## 2013-11-24 MED ORDER — MIDAZOLAM HCL 2 MG/2ML IJ SOLN
INTRAMUSCULAR | Status: AC
  Filled 2013-11-24: qty 2

## 2013-11-24 MED ORDER — FENTANYL CITRATE 0.05 MG/ML IJ SOLN
INTRAMUSCULAR | Status: AC
  Filled 2013-11-24: qty 2

## 2013-11-24 MED ORDER — DIAZEPAM 5 MG PO TABS
5.0000 mg | ORAL_TABLET | ORAL | Status: AC
  Administered 2013-11-24: 5 mg via ORAL
  Filled 2013-11-24: qty 1

## 2013-11-24 MED ORDER — HEPARIN (PORCINE) IN NACL 2-0.9 UNIT/ML-% IJ SOLN
INTRAMUSCULAR | Status: AC
  Filled 2013-11-24: qty 1000

## 2013-11-24 MED ORDER — ASPIRIN EC 81 MG PO TBEC
81.0000 mg | DELAYED_RELEASE_TABLET | Freq: Every day | ORAL | Status: DC
  Administered 2013-11-24 – 2013-11-28 (×5): 81 mg via ORAL
  Filled 2013-11-24 (×5): qty 1

## 2013-11-24 MED ORDER — HEPARIN (PORCINE) IN NACL 100-0.45 UNIT/ML-% IJ SOLN
1050.0000 [IU]/h | INTRAMUSCULAR | Status: DC
  Administered 2013-11-25: 1050 [IU]/h via INTRAVENOUS
  Filled 2013-11-24 (×3): qty 250

## 2013-11-24 MED ORDER — LIDOCAINE HCL (PF) 1 % IJ SOLN
INTRAMUSCULAR | Status: AC
  Filled 2013-11-24: qty 30

## 2013-11-24 MED ORDER — NITROGLYCERIN 0.2 MG/ML ON CALL CATH LAB
INTRAVENOUS | Status: AC
  Filled 2013-11-24: qty 1

## 2013-11-24 NOTE — Progress Notes (Signed)
Patient Name: Dana Blackwell Date of Encounter: 11/24/2013     Principal Problem:   Unstable angina Active Problems:   HYPOTHYROIDISM   DM, TYPE II   HYPERLIPIDEMIA   OBESITY   HYPERTENSION   CAD 3V at cath 2006- medical Rx   CAROTID ARTERY DISEASE- moderate LICA 93-81% doppler Nov 2014    SUBJECTIVE  The patient feels well this morning.  She did not sleep well last night because she found it difficult to relax and the blood pressure cuff was uncomfortable for her. Cardiac enzymes are negative x3.  Renal function is improved overnight after gentle hydration.  CURRENT MEDS . aspirin  325 mg Oral Daily  . atorvastatin  20 mg Oral Daily  . diazepam  5 mg Oral On Call  . folic acid-pyridoxine-cyancobalamin  1 tablet Oral Daily  . [START ON 11/25/2013] furosemide  20 mg Oral Daily  . [START ON 11/25/2013] glyBURIDE  5 mg Oral Q breakfast  . insulin aspart  0-15 Units Subcutaneous TID WC  . insulin aspart  0-5 Units Subcutaneous QHS  . insulin glargine  8 Units Subcutaneous QHS  . levothyroxine  75 mcg Oral QAC breakfast  . metoprolol succinate  100 mg Oral Daily  . nitrofurantoin  100 mg Oral Daily  . nitroGLYCERIN  0.5 inch Topical 4 times per day  . omega-3 acid ethyl esters  1 g Oral Daily  . pantoprazole  40 mg Oral Daily  . sertraline  50 mg Oral Daily    OBJECTIVE  Filed Vitals:   11/24/13 0405 11/24/13 0419 11/24/13 0600 11/24/13 0748  BP: 159/61   168/57  Pulse: 65   72  Temp:    98.7 F (37.1 C)  TempSrc:    Oral  Resp: 18   14  Height:      Weight:  146 lb 9.7 oz (66.5 kg)    SpO2: 97%  98% 97%    Intake/Output Summary (Last 24 hours) at 11/24/13 1109 Last data filed at 11/24/13 0900  Gross per 24 hour  Intake 505.03 ml  Output   1750 ml  Net -1244.97 ml   Filed Weights   11/23/13 2130 11/24/13 0419  Weight: 147 lb 14.9 oz (67.1 kg) 146 lb 9.7 oz (66.5 kg)    PHYSICAL EXAM  General: Pleasant, NAD. Neuro: Alert and oriented X 3. Moves  all extremities spontaneously. Psych: Normal affect. HEENT:  Normal  Neck: Supple without bruits or JVD. Lungs:  Resp regular and unlabored, CTA. Heart: RRR no s3, s4, or murmurs. Abdomen: Soft, non-tender, non-distended, BS + x 4.  Extremities: No clubbing, cyanosis or edema. DP/PT/Radials 2+ and equal bilaterally.  Accessory Clinical Findings  CBC  Recent Labs  11/23/13 0907 11/24/13 0112  WBC 14.0* 13.4*  HGB 12.8 12.0  HCT 39.1 36.4  MCV 93.1 91.9  PLT 320 829   Basic Metabolic Panel  Recent Labs  11/23/13 0907 11/24/13 0112  NA 140 143  K 4.8 4.6  CL 102 104  CO2 23 24  GLUCOSE 228* 92  BUN 31* 26*  CREATININE 1.39* 1.22*  CALCIUM 9.5 9.7   Liver Function Tests No results found for this basename: AST, ALT, ALKPHOS, BILITOT, PROT, ALBUMIN,  in the last 72 hours No results found for this basename: LIPASE, AMYLASE,  in the last 72 hours Cardiac Enzymes  Recent Labs  11/23/13 1251 11/23/13 1839 11/24/13 0112  TROPONINI <0.30 <0.30 <0.30   BNP No components found with this  basename: POCBNP,  D-Dimer No results found for this basename: DDIMER,  in the last 72 hours Hemoglobin A1C No results found for this basename: HGBA1C,  in the last 72 hours Fasting Lipid Panel No results found for this basename: CHOL, HDL, LDLCALC, TRIG, CHOLHDL, LDLDIRECT,  in the last 72 hours Thyroid Function Tests No results found for this basename: TSH, T4TOTAL, FREET3, T3FREE, THYROIDAB,  in the last 72 hours  TELE  Normal sinus rhythm with occasional PVC  ECG  No acute changes  Radiology/Studies  Dg Chest 2 View  11/23/2013   CLINICAL DATA:  Chest pain.  EXAM: CHEST  2 VIEW  COMPARISON:  CT CHEST W/O CM dated 11/29/2010; DG CHEST 1V PORT dated 05/27/2010  FINDINGS: The lung volumes. Cardiac silhouette within normal limits. Atherosclerotic calcification in the aorta. There is diffuse prominence of the interstitial markings. Areas of increased density projects in the lung  bases and there is blunting of the costophrenic angles. Degenerative changes are appreciated within the shoulders and thoracolumbar spine. There is flattening of the hemidiaphragms increased AP diameter of the chest.  IMPRESSION: COPD  Interstitial findings likely reflecting pulmonary fibrosis. Atelectasis versus infiltrate versus scarring within the lung bases left greater than right. Small bilateral effusions.   Electronically Signed   By: Margaree Mackintosh M.D.   On: 11/23/2013 09:49    ASSESSMENT AND PLAN  80 yo with history of moderate CAD on 2005 cath (medically managed), HTN, diabetes, h/o CVA presented with pattern ofCrescendo angina.   Plan: Continue IV heparin, statin, metoprolol, aspirin.  Cardiac catheterization later today.   Signed, Darlin Coco MD

## 2013-11-24 NOTE — Care Management Note (Addendum)
    Page 1 of 2   11/28/2013     4:32:11 PM   CARE MANAGEMENT NOTE 11/28/2013  Patient:  Dana Blackwell, Dana Blackwell   Account Number:  [id number]  Date Initiated:  11/24/2013  Documentation initiated by:  Elissa Hefty  Subjective/Objective Assessment:   adm w ch pain     Action/Plan:   lives alone, pcp dr Jeneen Rinks kim   Anticipated DC Date:  11/28/2013   Anticipated DC Plan:  [place]  CM consult      PAC Choice  [place]   Choice offered to / List presented to:  [name]   DME arranged  [facility]      DME agency  [facility]     HH arranged  HH-1 RN  [place].   Status of service:   Medicare Important Message given?   (If response is "NO", the following Medicare IM given date fields will be blank) Date Medicare IM given:   Date Additional Medicare IM given:    Discharge Disposition:  [place]  Per UR Regulation:  Reviewed for med. necessity/level of care/duration of stay  If discussed at [place] of Stay Meetings, dates discussed:    Comments:  4/10  1516p debbie Sidharth Leverette rn,bsn recieved call from [facility] rep w dme who now states they cannot provide home o2 for pt. pt has [facility] port o2 that [facility] had del to room and pt has left hosp w [facility] port o2. have faxed orders for home o2 to [facility] who has [place] contract also. have req they set up o2 at home. await call back from [facility] that they have all they need for set up. pt on route and will speak w pt to explain when i can reach her.was able to speak w pt and let her know changed dme to [facility]. gave pt phone # and name of new dme agncy.  4/10 1153a debbie Shilo Philipson rn,bsn spoke w pt. she will ned home o2 and hhrn and bath aid if ins covers. pt has [employer]. no pref by pt to dme or hhc agency as long as they take her [facility]. ref to donna w [facility] for hhrn and bath aid if  possible.

## 2013-11-24 NOTE — H&P (View-Only) (Signed)
Patient Name: Dana Blackwell Date of Encounter: 11/24/2013     Principal Problem:   Unstable angina Active Problems:   HYPOTHYROIDISM   DM, TYPE II   HYPERLIPIDEMIA   OBESITY   HYPERTENSION   CAD 3V at cath 2006- medical Rx   CAROTID ARTERY DISEASE- moderate LICA 93-81% doppler Nov 2014    SUBJECTIVE  The patient feels well this morning.  She did not sleep well last night because she found it difficult to relax and the blood pressure cuff was uncomfortable for her. Cardiac enzymes are negative x3.  Renal function is improved overnight after gentle hydration.  CURRENT MEDS . aspirin  325 mg Oral Daily  . atorvastatin  20 mg Oral Daily  . diazepam  5 mg Oral On Call  . folic acid-pyridoxine-cyancobalamin  1 tablet Oral Daily  . [START ON 11/25/2013] furosemide  20 mg Oral Daily  . [START ON 11/25/2013] glyBURIDE  5 mg Oral Q breakfast  . insulin aspart  0-15 Units Subcutaneous TID WC  . insulin aspart  0-5 Units Subcutaneous QHS  . insulin glargine  8 Units Subcutaneous QHS  . levothyroxine  75 mcg Oral QAC breakfast  . metoprolol succinate  100 mg Oral Daily  . nitrofurantoin  100 mg Oral Daily  . nitroGLYCERIN  0.5 inch Topical 4 times per day  . omega-3 acid ethyl esters  1 g Oral Daily  . pantoprazole  40 mg Oral Daily  . sertraline  50 mg Oral Daily    OBJECTIVE  Filed Vitals:   11/24/13 0405 11/24/13 0419 11/24/13 0600 11/24/13 0748  BP: 159/61   168/57  Pulse: 65   72  Temp:    98.7 F (37.1 C)  TempSrc:    Oral  Resp: 18   14  Height:      Weight:  146 lb 9.7 oz (66.5 kg)    SpO2: 97%  98% 97%    Intake/Output Summary (Last 24 hours) at 11/24/13 1109 Last data filed at 11/24/13 0900  Gross per 24 hour  Intake 505.03 ml  Output   1750 ml  Net -1244.97 ml   Filed Weights   11/23/13 2130 11/24/13 0419  Weight: 147 lb 14.9 oz (67.1 kg) 146 lb 9.7 oz (66.5 kg)    PHYSICAL EXAM  General: Pleasant, NAD. Neuro: Alert and oriented X 3. Moves  all extremities spontaneously. Psych: Normal affect. HEENT:  Normal  Neck: Supple without bruits or JVD. Lungs:  Resp regular and unlabored, CTA. Heart: RRR no s3, s4, or murmurs. Abdomen: Soft, non-tender, non-distended, BS + x 4.  Extremities: No clubbing, cyanosis or edema. DP/PT/Radials 2+ and equal bilaterally.  Accessory Clinical Findings  CBC  Recent Labs  11/23/13 0907 11/24/13 0112  WBC 14.0* 13.4*  HGB 12.8 12.0  HCT 39.1 36.4  MCV 93.1 91.9  PLT 320 829   Basic Metabolic Panel  Recent Labs  11/23/13 0907 11/24/13 0112  NA 140 143  K 4.8 4.6  CL 102 104  CO2 23 24  GLUCOSE 228* 92  BUN 31* 26*  CREATININE 1.39* 1.22*  CALCIUM 9.5 9.7   Liver Function Tests No results found for this basename: AST, ALT, ALKPHOS, BILITOT, PROT, ALBUMIN,  in the last 72 hours No results found for this basename: LIPASE, AMYLASE,  in the last 72 hours Cardiac Enzymes  Recent Labs  11/23/13 1251 11/23/13 1839 11/24/13 0112  TROPONINI <0.30 <0.30 <0.30   BNP No components found with this  basename: POCBNP,  D-Dimer No results found for this basename: DDIMER,  in the last 72 hours Hemoglobin A1C No results found for this basename: HGBA1C,  in the last 72 hours Fasting Lipid Panel No results found for this basename: CHOL, HDL, LDLCALC, TRIG, CHOLHDL, LDLDIRECT,  in the last 72 hours Thyroid Function Tests No results found for this basename: TSH, T4TOTAL, FREET3, T3FREE, THYROIDAB,  in the last 72 hours  TELE  Normal sinus rhythm with occasional PVC  ECG  No acute changes  Radiology/Studies  Dg Chest 2 View  11/23/2013   CLINICAL DATA:  Chest pain.  EXAM: CHEST  2 VIEW  COMPARISON:  CT CHEST W/O CM dated 11/29/2010; DG CHEST 1V PORT dated 05/27/2010  FINDINGS: The lung volumes. Cardiac silhouette within normal limits. Atherosclerotic calcification in the aorta. There is diffuse prominence of the interstitial markings. Areas of increased density projects in the lung  bases and there is blunting of the costophrenic angles. Degenerative changes are appreciated within the shoulders and thoracolumbar spine. There is flattening of the hemidiaphragms increased AP diameter of the chest.  IMPRESSION: COPD  Interstitial findings likely reflecting pulmonary fibrosis. Atelectasis versus infiltrate versus scarring within the lung bases left greater than right. Small bilateral effusions.   Electronically Signed   By: Margaree Mackintosh M.D.   On: 11/23/2013 09:49    ASSESSMENT AND PLAN  80 yo with history of moderate CAD on 2005 cath (medically managed), HTN, diabetes, h/o CVA presented with pattern ofCrescendo angina.   Plan: Continue IV heparin, statin, metoprolol, aspirin.  Cardiac catheterization later today.   Signed, Darlin Coco MD

## 2013-11-24 NOTE — Progress Notes (Addendum)
ANTICOAGULATION CONSULT NOTE - Follow Up Consult  Pharmacy Consult for Heparin  Indication: chest pain/ACS  Allergies  Allergen Reactions  . Tetanus Toxoid     Patient Measurements: Heparin Dosing Weight: 60 kg  Vital Signs: Temp: 98.7 F (37.1 C) (04/06 0748) Temp src: Oral (04/06 0748) BP: 168/57 mmHg (04/06 0748) Pulse Rate: 72 (04/06 0748)  Labs:  Recent Labs  11/23/13 0907 11/23/13 1251 11/23/13 1839 11/23/13 2225 11/24/13 0112 11/24/13 1006  HGB 12.8  --   --   --  12.0  --   HCT 39.1  --   --   --  36.4  --   PLT 320  --   --   --  316  --   LABPROT  --   --   --   --  13.1  --   INR  --   --   --   --  1.01  --   HEPARINUNFRC  --   --   --  0.30 0.28* 0.40  CREATININE 1.39*  --   --   --  1.22*  --   TROPONINI  --  <0.30 <0.30  --  <0.30  --    Medications: Heparin @ 950 units/hr  Assessment: 85yof continues on heparin for chest pain. Heparin level is theraeputic. CBC stable. No bleeding reported. Serum creatinine improved - plan for cath today.  Goal of Therapy:  Heparin level 0.3-0.7 units/ml Monitor platelets by anticoagulation protocol: Yes   Plan:  1) Continue heparin at 950 units/hr 2) Follow up after cath  Deboraha Sprang 11/24/2013,11:09 AM  Patient is now s/p cath. She was found to have significant coronary artery calcification with high grade very proximal LAD stenosis. Heparin to be resumed post-cath awaiting possible staged PCI.  Sheath removed at 1249.  Plan: 1) At ~2100, begin heparin at 950 units/hr 2) Follow up daily heparin level  Deboraha Sprang 11/24/2013, 1:51 PM

## 2013-11-24 NOTE — Interval H&P Note (Signed)
Cath Lab Visit (complete for each Cath Lab visit)  Clinical Evaluation Leading to the Procedure:   ACS: no  Non-ACS:    Anginal Classification: CCS III  Anti-ischemic medical therapy: Maximal Therapy (2 or more classes of medications)  Non-Invasive Test Results: No non-invasive testing performed  Prior CABG: No previous CABG      History and Physical Interval Note:  11/24/2013 11:55 AM  Dana Blackwell  has presented today for surgery, with the diagnosis of cp  The various methods of treatment have been discussed with the patient and family. After consideration of risks, benefits and other options for treatment, the patient has consented to  Procedure(s): LEFT HEART CATHETERIZATION WITH CORONARY ANGIOGRAM (N/A) as a surgical intervention .  The patient's history has been reviewed, patient examined, no change in status, stable for surgery.  I have reviewed the patient's chart and labs.  Questions were answered to the patient's satisfaction.     Ardella Chhim A

## 2013-11-24 NOTE — CV Procedure (Signed)
Dana Blackwell is a 80 y.o. female    [id number]  [id number] LOCATION:  FACILITY: [facility]  PHYSICIAN: Troy Sine, MD, [facility] Jan 12, 1928   DATE OF PROCEDURE:  11/24/2013    CARDIAC CATHETERIZATION     HISTORY: [name] is an 80 year old female who is followed by Dr. Burt Knack. In 2005 she underwent cardiac catheterization by Dr. Evangeline Dakin which showed moderate three-vessel coronary artery disease for which medical therapy was recommended. The patient recently has experienced increasing episodes of exertional discomfort. She was hospitalized on 11/23/2013 with unstable angina. She now presents for cardiac catheterization.   PROCEDURE:  The patient was brought to the second floor [facility] cath lab in the postabsorptive state. She was premedicated with Versed 2 mg and fentanyl 25 mcg. Right femoral artery was punctured anteriorly and a 5 French sheath was inserted without difficulty. Diagnostic catheterization was done utilizing 5 French Judkins 4 left and right coronary catheters. A 5 French pigtail catheter was used for left ventriculography. Hemostasis was obtained by direct manual pressure. Patient tolerated procedure well.   HEMODYNAMICS:   Central Aorta: 170/78   Left Ventricle: 170/14  ANGIOGRAPHY:  Fluoroscopy reveals extensive mitral annular calcification as well as moderate diffuse coronary calcification.  1. Left main: Large vessel which trifurcated into the LAD, a small ramus intermediate vessel, a dominant left circumflex coronary artery. 2. LAD: Very calcified proximal segment with 80% proximal stenosis before the first diagonal vessel. It was diffuse disease of 50% after  the diagonal. 3. Ramus Intermediate: 95% ostial stenosis in a small vessel. 4. Left circumflex: Large dominant vessel that has 70% proximal stenosis and a moderate size first marginal branch. There was 30-40% a degree of stenosis after the first marginal takeoff. There was  an 80% distal circumflex stenosis of the PLA vessel after several small LV branches  5. Right coronary artery: Ostial calcification with 70% ostial narrowing and 80% diffuse proximal stenosis in the small nondominant right coronary artery   Left ventriculography revealed normal LV function with an ejection fraction of 60%. There was extensive mitral annular calcification. There was left ventricular hypertrophy.   IMPRESSION:  Significant diffuse coronary calcification with three-vessel coronary obstructive disease and 80% proximal LAD stenosis before the first diagonal vessel with diffuse 50% mid LAD narrowing; 95% ostial stenosis of the diminutive ramus intermediate vessel; large dominant right coronary artery with 70% proximal OM1 stenosis, 30-40% AV groove stenosis after the marginal vessel and distal 80% stenosis involving the proximal portion of the PLA branch; ostial calcification with 70% ostial narrowing followed by diffuse 80% proximal right coronary artery narrowing.   RECOMMENDATION:  Angiographic findings will be reviewed with the patient's primary cardiologist, Dr. Burt Knack. The patient does have significant coronary artery calcification with high grade very proximal LAD stenosis. The patient will be hydrated. If percutaneous coronary intervention is pursued, high-speed rotational atherectomy may be necessary due to the significant coronary calcification.  Troy Sine, MD, [facility] 11/24/2013 12:51 PM

## 2013-11-24 NOTE — Progress Notes (Signed)
Inpatient Diabetes Program Recommendations  AACE/ADA: New Consensus Statement on Inpatient Glycemic Control (2013)  Target Ranges:  Prepandial:   less than 140 mg/dL      Peak postprandial:   less than 180 mg/dL (1-2 hours)      Critically ill patients:  140 - 180 mg/dL  Results for ANGELENE, ROME (MRN [id number]) as of 11/24/2013 12:11  Ref. Range 11/23/2013 12:51 11/23/2013 17:01 11/23/2013 22:43 11/24/2013 00:26  Glucose-Capillary Latest Range: 70-99 mg/dL 154 (H) 307 (H) 63 (L) 99   Inpatient Diabetes Program Recommendations Correction (SSI): decrease to sensitive scale TID + HS Thank you  Raoul Pitch BSN, RN,CDE Inpatient Diabetes Coordinator [phone] (team pager)

## 2013-11-25 DIAGNOSIS — E119 Type 2 diabetes mellitus without complications: Secondary | ICD-10-CM | POA: Diagnosis not present

## 2013-11-25 DIAGNOSIS — J449 Chronic obstructive pulmonary disease, unspecified: Secondary | ICD-10-CM | POA: Diagnosis not present

## 2013-11-25 DIAGNOSIS — I214 Non-ST elevation (NSTEMI) myocardial infarction: Secondary | ICD-10-CM | POA: Diagnosis not present

## 2013-11-25 DIAGNOSIS — I6529 Occlusion and stenosis of unspecified carotid artery: Secondary | ICD-10-CM | POA: Diagnosis not present

## 2013-11-25 LAB — HEPARIN LEVEL (UNFRACTIONATED)
HEPARIN UNFRACTIONATED: 0.28 [IU]/mL — AB (ref 0.30–0.70)
Heparin Unfractionated: 0.53 IU/mL (ref 0.30–0.70)

## 2013-11-25 LAB — CBC
HEMATOCRIT: 35.3 % — AB (ref 36.0–46.0)
Hemoglobin: 11.3 g/dL — ABNORMAL LOW (ref 12.0–15.0)
MCH: 29.9 pg (ref 26.0–34.0)
MCHC: 32 g/dL (ref 30.0–36.0)
MCV: 93.4 fL (ref 78.0–100.0)
Platelets: 279 10*3/uL (ref 150–400)
RBC: 3.78 MIL/uL — ABNORMAL LOW (ref 3.87–5.11)
RDW: 15 % (ref 11.5–15.5)
WBC: 9.5 10*3/uL (ref 4.0–10.5)

## 2013-11-25 LAB — BASIC METABOLIC PANEL
BUN: 21 mg/dL (ref 6–23)
CALCIUM: 9.3 mg/dL (ref 8.4–10.5)
CO2: 22 meq/L (ref 19–32)
CREATININE: 1.08 mg/dL (ref 0.50–1.10)
Chloride: 103 mEq/L (ref 96–112)
GFR calc Af Amer: 53 mL/min — ABNORMAL LOW (ref >90)
GFR, EST NON AFRICAN AMERICAN: 46 mL/min — AB (ref >90)
Glucose, Bld: 238 mg/dL — ABNORMAL HIGH (ref 70–99)
Potassium: 4.4 mEq/L (ref 3.7–5.3)
SODIUM: 140 meq/L (ref 137–147)

## 2013-11-25 LAB — GLUCOSE, CAPILLARY
GLUCOSE-CAPILLARY: 172 mg/dL — AB (ref 70–99)
Glucose-Capillary: 150 mg/dL — ABNORMAL HIGH (ref 70–99)
Glucose-Capillary: 201 mg/dL — ABNORMAL HIGH (ref 70–99)
Glucose-Capillary: 136 mg/dL — ABNORMAL HIGH (ref 70–99)

## 2013-11-25 MED ORDER — SODIUM CHLORIDE 0.9 % IV SOLN
1.0000 mL/kg/h | INTRAVENOUS | Status: DC
  Administered 2013-11-26: 1.007 mL/kg/h via INTRAVENOUS

## 2013-11-25 MED ORDER — CLOPIDOGREL BISULFATE 75 MG PO TABS
75.0000 mg | ORAL_TABLET | Freq: Every day | ORAL | Status: DC
  Administered 2013-11-27 – 2013-11-28 (×2): 75 mg via ORAL
  Filled 2013-11-25 (×3): qty 1

## 2013-11-25 MED ORDER — SODIUM CHLORIDE 0.9 % IV SOLN: 250.0000 mL | INTRAVENOUS | Status: DC | PRN

## 2013-11-25 MED ORDER — SODIUM CHLORIDE 0.9 % IJ SOLN: 3.0000 mL | INTRAMUSCULAR | Status: DC | PRN

## 2013-11-25 MED ORDER — SODIUM CHLORIDE 0.9 % IJ SOLN
3.0000 mL | Freq: Two times a day (BID) | INTRAMUSCULAR | Status: DC
  Administered 2013-11-26: 3 mL via INTRAVENOUS

## 2013-11-25 MED ORDER — CLOPIDOGREL BISULFATE 300 MG PO TABS
600.0000 mg | ORAL_TABLET | Freq: Once | ORAL | Status: AC
  Administered 2013-11-26: 600 mg via ORAL
  Filled 2013-11-25 (×2): qty 2

## 2013-11-25 MED ORDER — LISINOPRIL 5 MG PO TABS
5.0000 mg | ORAL_TABLET | Freq: Every day | ORAL | Status: DC
  Administered 2013-11-25 – 2013-11-28 (×4): 5 mg via ORAL
  Filled 2013-11-25 (×4): qty 1

## 2013-11-25 NOTE — Progress Notes (Signed)
ANTICOAGULATION CONSULT NOTE - Follow Up Consult  Pharmacy Consult for Heparin  Indication: chest pain/ACS  Allergies  Allergen Reactions  . Tetanus Toxoid     Patient Measurements: Heparin Dosing Weight: 60 kg  Vital Signs: Temp: 98.6 F (37 C) (04/07 1200) Temp src: Oral (04/07 1200) BP: 183/74 mmHg (04/07 1200) Pulse Rate: 69 (04/07 0800)  Labs:  Recent Labs  11/23/13 0907 11/23/13 1251 11/23/13 1839  11/24/13 0112 11/24/13 1006 11/25/13 0358 11/25/13 1330  HGB 12.8  --   --   --  12.0  --  11.3*  --   HCT 39.1  --   --   --  36.4  --  35.3*  --   PLT 320  --   --   --  316  --  279  --   LABPROT  --   --   --   --  13.1  --   --   --   INR  --   --   --   --  1.01  --   --   --   HEPARINUNFRC  --   --   --   < > 0.28* 0.40 0.28* 0.53  CREATININE 1.39*  --   --   --  1.22*  --   --   --   TROPONINI  --  <0.30 <0.30  --  <0.30  --   --   --   < > = values in this interval not displayed.  Medications: Heparin @ 1050 units/hr  Assessment: 85yof continues on heparin for high grade LAD stenosis with plans for PCI tomorrow. Heparin level is therapeutic. CBC stable. No bleeding reported.   Goal of Therapy:  Heparin level 0.3-0.7 units/ml Monitor platelets by anticoagulation protocol: Yes   Plan:  1) Continue heparin at 1050 units/hr 2) Follow up heparin level, CBC in AM  Deboraha Sprang 11/25/2013,2:39 PM

## 2013-11-25 NOTE — Progress Notes (Signed)
Patient Name: Dana Blackwell Date of Encounter: 11/25/2013     Principal Problem:   Unstable angina Active Problems:   HYPOTHYROIDISM   DM, TYPE II   HYPERLIPIDEMIA   OBESITY   HYPERTENSION   CAD 3V at cath 2006- medical Rx   CAROTID ARTERY DISEASE- moderate LICA 52-84% doppler Nov 2014    SUBJECTIVE  The patient has had no further chest pain.  She remains on IV heparin.  She will undergo high-speed rotational atherectomy of her LAD tomorrow by Dr. Martinique.  Rhythm is normal sinus rhythm.  She is mildly hypertensive.  CURRENT MEDS . aspirin  81 mg Oral Daily  . atorvastatin  20 mg Oral Daily  . folic acid-pyridoxine-cyancobalamin  1 tablet Oral Daily  . furosemide  20 mg Oral Daily  . glyBURIDE  5 mg Oral Q breakfast  . insulin aspart  0-15 Units Subcutaneous TID WC  . insulin aspart  0-5 Units Subcutaneous QHS  . insulin glargine  8 Units Subcutaneous QHS  . levothyroxine  75 mcg Oral QAC breakfast  . metoprolol succinate  100 mg Oral Daily  . nitroGLYCERIN  0.5 inch Topical 4 times per day  . omega-3 acid ethyl esters  1 g Oral Daily  . pantoprazole  40 mg Oral Daily  . sertraline  50 mg Oral Daily    OBJECTIVE  Filed Vitals:   11/24/13 2000 11/25/13 0000 11/25/13 0400 11/25/13 0800  BP: 159/51 175/65 169/59 193/60  Pulse: 69 67 72 69  Temp: 98.7 F (37.1 C) 98.7 F (37.1 C) 98.6 F (37 C) 98.8 F (37.1 C)  TempSrc: Oral Oral Oral Oral  Resp: 13 20 11 16   Height:      Weight:   148 lb 13 oz (67.5 kg)   SpO2: 99% 97% 98% 95%    Intake/Output Summary (Last 24 hours) at 11/25/13 1146 Last data filed at 11/25/13 1100  Gross per 24 hour  Intake 2510.78 ml  Output   2300 ml  Net 210.78 ml   Filed Weights   11/23/13 2130 11/24/13 0419 11/25/13 0400  Weight: 147 lb 14.9 oz (67.1 kg) 146 lb 9.7 oz (66.5 kg) 148 lb 13 oz (67.5 kg)    PHYSICAL EXAM  General: Pleasant, NAD. Neuro: Alert and oriented X 3. Moves all extremities  spontaneously. Psych: Normal affect. HEENT:  Normal  Neck: Supple without bruits or JVD. Lungs:  Resp regular and unlabored, CTA. Heart: RRR no s3, s4, or murmurs. Abdomen: Soft, non-tender, non-distended, BS + x 4.  Right groin reveals no hematoma  Extremities: No clubbing, cyanosis or edema. DP/PT/Radials 2+ and equal bilaterally.  Accessory Clinical Findings  CBC  Recent Labs  11/24/13 0112 11/25/13 0358  WBC 13.4* 9.5  HGB 12.0 11.3*  HCT 36.4 35.3*  MCV 91.9 93.4  PLT 316 132   Basic Metabolic Panel  Recent Labs  11/23/13 0907 11/24/13 0112  NA 140 143  K 4.8 4.6  CL 102 104  CO2 23 24  GLUCOSE 228* 92  BUN 31* 26*  CREATININE 1.39* 1.22*  CALCIUM 9.5 9.7   Liver Function Tests No results found for this basename: AST, ALT, ALKPHOS, BILITOT, PROT, ALBUMIN,  in the last 72 hours No results found for this basename: LIPASE, AMYLASE,  in the last 72 hours Cardiac Enzymes  Recent Labs  11/23/13 1251 11/23/13 1839 11/24/13 0112  TROPONINI <0.30 <0.30 <0.30   BNP No components found with this basename: POCBNP,  D-Dimer No  results found for this basename: DDIMER,  in the last 72 hours Hemoglobin A1C No results found for this basename: HGBA1C,  in the last 72 hours Fasting Lipid Panel No results found for this basename: CHOL, HDL, LDLCALC, TRIG, CHOLHDL, LDLDIRECT,  in the last 72 hours Thyroid Function Tests No results found for this basename: TSH, T4TOTAL, FREET3, T3FREE, THYROIDAB,  in the last 72 hours  TELE  Normal sinus rhythm  ECG  Normal sinus rhythm.  Within normal limits.  Radiology/Studies  Dg Chest 2 View  11/23/2013   CLINICAL DATA:  Chest pain.  EXAM: CHEST  2 VIEW  COMPARISON:  CT CHEST W/O CM dated 11/29/2010; DG CHEST 1V PORT dated 05/27/2010  FINDINGS: The lung volumes. Cardiac silhouette within normal limits. Atherosclerotic calcification in the aorta. There is diffuse prominence of the interstitial markings. Areas of increased  density projects in the lung bases and there is blunting of the costophrenic angles. Degenerative changes are appreciated within the shoulders and thoracolumbar spine. There is flattening of the hemidiaphragms increased AP diameter of the chest.  IMPRESSION: COPD  Interstitial findings likely reflecting pulmonary fibrosis. Atelectasis versus infiltrate versus scarring within the lung bases left greater than right. Small bilateral effusions.   Electronically Signed   By: Margaree Mackintosh M.D.   On: 11/23/2013 09:49    ASSESSMENT AND PLAN Crescendo angina pectoris with findings at cardiac catheterization as noted below: Fluoroscopy reveals extensive mitral annular calcification as well as moderate diffuse coronary calcification.  1. Left main: Large vessel which trifurcated into the LAD, a small ramus intermediate vessel, a dominant left circumflex coronary artery.  2. LAD: Very calcified proximal segment with 80% proximal stenosis before the first diagonal vessel. It was diffuse disease of 50% after the diagonal.  3. Ramus Intermediate: 95% ostial stenosis in a small vessel.  4. Left circumflex: Large dominant vessel that has 70% proximal stenosis and a moderate size first marginal branch. There was 30-40% a degree of stenosis after the first marginal takeoff. There was an 80% distal circumflex stenosis of the PLA vessel after several small LV branches  5. Right coronary artery: Ostial calcification with 70% ostial narrowing and 80% diffuse proximal stenosis in the small nondominant right coronary artery  Left ventriculography revealed normal LV function with an ejection fraction of 60%. There was extensive mitral annular calcification. There was left ventricular hypertrophy.  Plan: The patient will undergo high-speed rotational atherectomy tomorrow on her LAD.  They are checking a post catheter BMET today.  Her blood pressure is mildly elevated and we are admitting low-dose lisinopril.  The patient is a  diabetic.  Signed, Darlin Coco MD

## 2013-11-25 NOTE — Progress Notes (Signed)
[facility] for Heparin  Indication: chest pain/ACS  Allergies  Allergen Reactions  . Tetanus Toxoid     Patient Measurements: Heparin Dosing Weight: 60 kg  Vital Signs: Temp: 98.6 F (37 C) (04/07 0400) Temp src: Oral (04/07 0400) BP: 169/59 mmHg (04/07 0400) Pulse Rate: 72 (04/07 0400)  Labs:  Recent Labs  11/23/13 0907 11/23/13 1251 11/23/13 1839  11/24/13 0112 11/24/13 1006 11/25/13 0358  HGB 12.8  --   --   --  12.0  --  11.3*  HCT 39.1  --   --   --  36.4  --  35.3*  PLT 320  --   --   --  316  --  279  LABPROT  --   --   --   --  13.1  --   --   INR  --   --   --   --  1.01  --   --   HEPARINUNFRC  --   --   --   < > 0.28* 0.40 0.28*  CREATININE 1.39*  --   --   --  1.22*  --   --   TROPONINI  --  <0.30 <0.30  --  <0.30  --   --   < > = values in this interval not displayed. Medications: Heparin @ 950 units/hr  Assessment: 80 yo female s/p cath, with CAD awaiting possible PCI for heparin  Goal of Therapy:  Heparin level 0.3-0.7 units/ml Monitor platelets by anticoagulation protocol: Yes   Plan:  Increase Heparin 1050 units/hr F/U plan for PCI  Phillis Knack, PharmD, BCPS

## 2013-11-25 NOTE — Progress Notes (Signed)
Interventional cardiology  I reviewed cardiac cath data with Dr. Burt Knack. Culprit lesion appears to be a high grade calcified lesion in the proximal LAD. Recommend PCI tomorrow. Plan femoral approach with no palpable right radial pulse. Will load with Plavix. Plan orbital atherectomy to prepare the lesion for stenting. The procedure and risks were reviewed including but not limited to death, myocardial infarction, stroke, arrythmias, bleeding, transfusion, emergency surgery, dye allergy, or renal dysfunction. The patient voices understanding and is agreeable to proceed.   Dana Azua Martinique MD, [facility]

## 2013-11-26 ENCOUNTER — Encounter (HOSPITAL_COMMUNITY)
Admission: EM | Disposition: A | Discharge status: Home Health | Payer: Commercial Managed Care - HMO | Source: Home / Self Care | Attending: Cardiology

## 2013-11-26 DIAGNOSIS — J449 Chronic obstructive pulmonary disease, unspecified: Secondary | ICD-10-CM | POA: Diagnosis not present

## 2013-11-26 DIAGNOSIS — I214 Non-ST elevation (NSTEMI) myocardial infarction: Secondary | ICD-10-CM | POA: Diagnosis not present

## 2013-11-26 DIAGNOSIS — I6529 Occlusion and stenosis of unspecified carotid artery: Secondary | ICD-10-CM | POA: Diagnosis not present

## 2013-11-26 DIAGNOSIS — I2510 Atherosclerotic heart disease of native coronary artery without angina pectoris: Secondary | ICD-10-CM

## 2013-11-26 DIAGNOSIS — E119 Type 2 diabetes mellitus without complications: Secondary | ICD-10-CM | POA: Diagnosis not present

## 2013-11-26 HISTORY — PX: PERCUTANEOUS CORONARY ROTOBLATOR INTERVENTION (PCI-R): SHX5484

## 2013-11-26 LAB — CBC
HCT: 35.1 % — ABNORMAL LOW (ref 36.0–46.0)
Hemoglobin: 11.4 g/dL — ABNORMAL LOW (ref 12.0–15.0)
MCH: 30.1 pg (ref 26.0–34.0)
MCHC: 32.5 g/dL (ref 30.0–36.0)
MCV: 92.6 fL (ref 78.0–100.0)
Platelets: 263 10*3/uL (ref 150–400)
RBC: 3.79 MIL/uL — ABNORMAL LOW (ref 3.87–5.11)
RDW: 14.8 % (ref 11.5–15.5)
WBC: 10.7 10*3/uL — ABNORMAL HIGH (ref 4.0–10.5)

## 2013-11-26 LAB — BASIC METABOLIC PANEL WITH GFR
BUN: 28 mg/dL — ABNORMAL HIGH (ref 6–23)
CO2: 20 meq/L (ref 19–32)
Calcium: 9 mg/dL (ref 8.4–10.5)
Chloride: 106 meq/L (ref 96–112)
Creatinine, Ser: 1.33 mg/dL — ABNORMAL HIGH (ref 0.50–1.10)
GFR calc Af Amer: 42 mL/min — ABNORMAL LOW
GFR calc non Af Amer: 36 mL/min — ABNORMAL LOW
Glucose, Bld: 130 mg/dL — ABNORMAL HIGH (ref 70–99)
Potassium: 4.4 meq/L (ref 3.7–5.3)
Sodium: 141 meq/L (ref 137–147)

## 2013-11-26 LAB — GLUCOSE, CAPILLARY
GLUCOSE-CAPILLARY: 253 mg/dL — AB (ref 70–99)
Glucose-Capillary: 125 mg/dL — ABNORMAL HIGH (ref 70–99)
Glucose-Capillary: 143 mg/dL — ABNORMAL HIGH (ref 70–99)

## 2013-11-26 LAB — POCT ACTIVATED CLOTTING TIME
ACTIVATED CLOTTING TIME: 254 s
Activated Clotting Time: 304 seconds
Activated Clotting Time: 498 seconds

## 2013-11-26 LAB — HEPARIN LEVEL (UNFRACTIONATED): Heparin Unfractionated: 0.52 [IU]/mL (ref 0.30–0.70)

## 2013-11-26 SURGERY — PERCUTANEOUS CORONARY ROTOBLATOR INTERVENTION (PCI-R)
Anesthesia: LOCAL

## 2013-11-26 MED ORDER — HEPARIN SODIUM (PORCINE) 1000 UNIT/ML IJ SOLN
INTRAMUSCULAR | Status: AC
  Filled 2013-11-26: qty 1

## 2013-11-26 MED ORDER — LABETALOL HCL 5 MG/ML IV SOLN: 10.0000 mg | Freq: Once | INTRAVENOUS | Status: DC

## 2013-11-26 MED ORDER — LIDOCAINE HCL (PF) 1 % IJ SOLN
INTRAMUSCULAR | Status: AC
  Filled 2013-11-26: qty 30

## 2013-11-26 MED ORDER — HYDRALAZINE HCL 20 MG/ML IJ SOLN
10.0000 mg | INTRAMUSCULAR | Status: DC | PRN
  Administered 2013-11-26: 10 mg via INTRAVENOUS
  Filled 2013-11-26: qty 1

## 2013-11-26 MED ORDER — HEPARIN (PORCINE) IN NACL 2-0.9 UNIT/ML-% IJ SOLN
INTRAMUSCULAR | Status: AC
  Filled 2013-11-26: qty 500

## 2013-11-26 MED ORDER — FENTANYL CITRATE 0.05 MG/ML IJ SOLN
INTRAMUSCULAR | Status: AC
  Filled 2013-11-26: qty 2

## 2013-11-26 MED ORDER — ATROPINE SULFATE 0.1 MG/ML IJ SOLN
INTRAMUSCULAR | Status: AC
  Filled 2013-11-26: qty 10

## 2013-11-26 MED ORDER — NITROGLYCERIN 0.2 MG/ML ON CALL CATH LAB
INTRAVENOUS | Status: AC
  Filled 2013-11-26: qty 1

## 2013-11-26 MED ORDER — MORPHINE SULFATE 2 MG/ML IJ SOLN
2.0000 mg | Freq: Once | INTRAMUSCULAR | Status: AC
  Administered 2013-11-26: 2 mg via INTRAVENOUS

## 2013-11-26 MED ORDER — HEPARIN (PORCINE) IN NACL 2-0.9 UNIT/ML-% IJ SOLN
INTRAMUSCULAR | Status: AC
  Filled 2013-11-26: qty 1000

## 2013-11-26 MED ORDER — MIDAZOLAM HCL 2 MG/2ML IJ SOLN
INTRAMUSCULAR | Status: AC
  Filled 2013-11-26: qty 2

## 2013-11-26 MED ORDER — MORPHINE SULFATE 2 MG/ML IJ SOLN
INTRAMUSCULAR | Status: AC
  Filled 2013-11-26: qty 1

## 2013-11-26 MED ORDER — SODIUM CHLORIDE 0.9 % IV SOLN
1.0000 mL/kg/h | INTRAVENOUS | Status: AC
Start: 2013-11-26 — End: 2013-11-27
  Administered 2013-11-26: 1 mL/kg/h via INTRAVENOUS

## 2013-11-26 MED ORDER — VERAPAMIL HCL 2.5 MG/ML IV SOLN
INTRAVENOUS | Status: AC
  Filled 2013-11-26: qty 2

## 2013-11-26 NOTE — Progress Notes (Signed)
ANTICOAGULATION CONSULT NOTE - Follow Up Consult  Pharmacy Consult for Heparin  Indication: chest pain/ACS  Allergies  Allergen Reactions  . Tetanus Toxoid     Patient Measurements: Heparin Dosing Weight: 60 kg  Vital Signs: Temp: 98.4 F (36.9 C) (04/08 0730) Temp src: Oral (04/08 0730) BP: 179/55 mmHg (04/08 0730) Pulse Rate: 68 (04/08 0730)  Labs:  Recent Labs  11/23/13 1251 11/23/13 1839  11/24/13 0112  11/25/13 0358 11/25/13 1330 11/26/13 0315  HGB  --   --   < > 12.0  --  11.3*  --  11.4*  HCT  --   --   --  36.4  --  35.3*  --  35.1*  PLT  --   --   --  316  --  279  --  263  LABPROT  --   --   --  13.1  --   --   --   --   INR  --   --   --  1.01  --   --   --   --   HEPARINUNFRC  --   --   < > 0.28*  < > 0.28* 0.53 0.52  CREATININE  --   --   --  1.22*  --   --  1.08 1.33*  TROPONINI <0.30 <0.30  --  <0.30  --   --   --   --   < > = values in this interval not displayed.  Medications: Heparin @ 1050 units/hr  Assessment: 85yof continues on heparin for high grade LAD stenosis with plans for PCI today.  Heparin level is therapeutic. CBC stable. No bleeding reported.   Goal of Therapy:  Heparin level 0.3-0.7 units/ml Monitor platelets by anticoagulation protocol: Yes   Plan:  1) Continue heparin at 1050 units/hr 2) Follow up heparin level, CBC in AM  Thank you. Anette Guarneri, PharmD [phone]  11/26/2013,11:25 AM

## 2013-11-26 NOTE — Interval H&P Note (Signed)
History and Physical Interval Note:  11/26/2013 2:58 PM  [facility]  has presented today for surgery, with the diagnosis of blockage  The various methods of treatment have been discussed with the patient and family. After consideration of risks, benefits and other options for treatment, the patient has consented to  Procedure(s): PERCUTANEOUS CORONARY ROTOBLATOR INTERVENTION (PCI-R) (N/A) as a surgical intervention .  The patient's history has been reviewed, patient examined, no change in status, stable for surgery.  I have reviewed the patient's chart and labs.  Questions were answered to the patient's satisfaction.    Cath Lab Visit (complete for each Cath Lab visit)  Clinical Evaluation Leading to the Procedure:   ACS: yes  Non-ACS:    Anginal Classification: CCS IV  Anti-ischemic medical therapy: Minimal Therapy (1 class of medications)  Non-Invasive Test Results: No non-invasive testing performed  Prior CABG: No previous CABG       Ander Slade [facility] 11/26/2013 2:59 PM

## 2013-11-26 NOTE — Progress Notes (Addendum)
Patient Name: Dana Blackwell Date of Encounter: 11/26/2013     Principal Problem:   Unstable angina Active Problems:   HYPOTHYROIDISM   DM, TYPE II   HYPERLIPIDEMIA   OBESITY   HYPERTENSION   CAD 3V at cath 2006- medical Rx   CAROTID ARTERY DISEASE- moderate LICA 40-08% doppler Nov 2014    SUBJECTIVE  No chest pain overnight.  Awaiting PCI this afternoon by Dr. Martinique.  CURRENT MEDS . aspirin  81 mg Oral Daily  . atorvastatin  20 mg Oral Daily  . clopidogrel  600 mg Oral Once  . clopidogrel  75 mg Oral Q breakfast  . folic acid-pyridoxine-cyancobalamin  1 tablet Oral Daily  . furosemide  20 mg Oral Daily  . glyBURIDE  5 mg Oral Q breakfast  . insulin aspart  0-15 Units Subcutaneous TID WC  . insulin aspart  0-5 Units Subcutaneous QHS  . insulin glargine  8 Units Subcutaneous QHS  . levothyroxine  75 mcg Oral QAC breakfast  . lisinopril  5 mg Oral Daily  . metoprolol succinate  100 mg Oral Daily  . nitroGLYCERIN  0.5 inch Topical 4 times per day  . omega-3 acid ethyl esters  1 g Oral Daily  . pantoprazole  40 mg Oral Daily  . sertraline  50 mg Oral Daily  . sodium chloride  3 mL Intravenous Q12H    OBJECTIVE  Filed Vitals:   11/26/13 0000 11/26/13 0400 11/26/13 0428 11/26/13 0730  BP: 91/49  148/63 179/55  Pulse: 66  72 68  Temp:  99 F (37.2 C)  98.4 F (36.9 C)  TempSrc:  Oral  Oral  Resp: 24  18 13   Height:      Weight:  147 lb 11.3 oz (67 kg)    SpO2: 94%  97% 96%    Intake/Output Summary (Last 24 hours) at 11/26/13 1108 Last data filed at 11/26/13 1100  Gross per 24 hour  Intake 1537.43 ml  Output      2 ml  Net 1535.43 ml   Filed Weights   11/24/13 0419 11/25/13 0400 11/26/13 0400  Weight: 146 lb 9.7 oz (66.5 kg) 148 lb 13 oz (67.5 kg) 147 lb 11.3 oz (67 kg)    PHYSICAL EXAM  General: Pleasant, NAD. Neuro: Alert and oriented X 3. Moves all extremities spontaneously. Psych: Normal affect. HEENT:  Normal  Neck: Supple without  bruits or JVD. Lungs:  Resp regular and unlabored, CTA. Heart: RRR no s3, s4, or murmurs. Abdomen: Soft, non-tender, non-distended, BS + x 4.  Extremities: No clubbing, cyanosis or edema. DP/PT/ 2+ and equal bilaterally.  Right radial pulse very weak.  Accessory Clinical Findings  CBC  Recent Labs  11/25/13 0358 11/26/13 0315  WBC 9.5 10.7*  HGB 11.3* 11.4*  HCT 35.3* 35.1*  MCV 93.4 92.6  PLT 279 676   Basic Metabolic Panel  Recent Labs  11/25/13 1330 11/26/13 0315  NA 140 141  K 4.4 4.4  CL 103 106  CO2 22 20  GLUCOSE 238* 130*  BUN 21 28*  CREATININE 1.08 1.33*  CALCIUM 9.3 9.0   Liver Function Tests No results found for this basename: AST, ALT, ALKPHOS, BILITOT, PROT, ALBUMIN,  in the last 72 hours No results found for this basename: LIPASE, AMYLASE,  in the last 72 hours Cardiac Enzymes  Recent Labs  11/23/13 1251 11/23/13 1839 11/24/13 0112  TROPONINI <0.30 <0.30 <0.30   BNP No components found with this basename:  POCBNP,  D-Dimer No results found for this basename: DDIMER,  in the last 72 hours Hemoglobin A1C No results found for this basename: HGBA1C,  in the last 72 hours Fasting Lipid Panel No results found for this basename: CHOL, HDL, LDLCALC, TRIG, CHOLHDL, LDLDIRECT,  in the last 72 hours Thyroid Function Tests No results found for this basename: TSH, T4TOTAL, FREET3, T3FREE, THYROIDAB,  in the last 72 hours  TELE  Normal sinus rhythm  ECG    Radiology/Studies  Dg Chest 2 View  11/23/2013   CLINICAL DATA:  Chest pain.  EXAM: CHEST  2 VIEW  COMPARISON:  CT CHEST W/O CM dated 11/29/2010; DG CHEST 1V PORT dated 05/27/2010  FINDINGS: The lung volumes. Cardiac silhouette within normal limits. Atherosclerotic calcification in the aorta. There is diffuse prominence of the interstitial markings. Areas of increased density projects in the lung bases and there is blunting of the costophrenic angles. Degenerative changes are appreciated within the  shoulders and thoracolumbar spine. There is flattening of the hemidiaphragms increased AP diameter of the chest.  IMPRESSION: COPD  Interstitial findings likely reflecting pulmonary fibrosis. Atelectasis versus infiltrate versus scarring within the lung bases left greater than right. Small bilateral effusions.   Electronically Signed   By: Margaree Mackintosh M.D.   On: 11/23/2013 09:49    ASSESSMENT AND PLAN Crescendo angina pectoris with findings at cardiac catheterization as noted below:  Fluoroscopy reveals extensive mitral annular calcification as well as moderate diffuse coronary calcification.  1. Left main: Large vessel which trifurcated into the LAD, a small ramus intermediate vessel, a dominant left circumflex coronary artery.  2. LAD: Very calcified proximal segment with 80% proximal stenosis before the first diagonal vessel. It was diffuse disease of 50% after the diagonal.  3. Ramus Intermediate: 95% ostial stenosis in a small vessel.  4. Left circumflex: Large dominant vessel that has 70% proximal stenosis and a moderate size first marginal branch. There was 30-40% a degree of stenosis after the first marginal takeoff. There was an 80% distal circumflex stenosis of the PLA vessel after several small LV branches  5. Right coronary artery: Ostial calcification with 70% ostial narrowing and 80% diffuse proximal stenosis in the small nondominant right coronary artery  Left ventriculography revealed normal LV function with an ejection fraction of 60%. There was extensive mitral annular calcification. There was left ventricular hypertrophy.   Plan: The patient will undergo high-speed rotational atherectomy today on her LAD.  Her renal function is stable.Marland Kitchen Her blood pressure is mildly elevated and she is now on low-dose lisinopril. The patient is a diabetic.  She is being loaded with Plavix.    Signed, Darlin Coco MD

## 2013-11-26 NOTE — Progress Notes (Signed)
Cardiac Cath Sheath Removal Note  Site: Right Femoral Artery and Vein  Site Prior to Removal: Level 0 Arterial Pressure Applied For: 25 minutes Beginning at 2054 until 2119 Venous Pressure Applied For: 10 minutes Beginning at 2121 until 2131 Patient Status during sheath pull: Anxious. Vital Signs Stable.  Post Sheath Removal Groin Site: Level 0 Post Cath Instructions Given: Yes Distal Pulses Present: Yes Pressure Dressing Applied: Yes  Therisa Doyne RN

## 2013-11-26 NOTE — CV Procedure (Signed)
    CARDIAC CATH NOTE  Name: Dana Blackwell MRN: [id number] DOB: 1928-03-01  Procedure: Orbital atherectomy with PTCA and stenting of the LAD.  Indication: 80 yo WF presented with a NSTEMI. Diagnostic cardiac cath demonstrated a severe stenosis in the proximal LAD. The vessel was heavily calcified. There was tortuousity in the mid vessel. She has a left dominant system.   Procedural Details: The right groin was prepped, draped, and anesthetized with 1% lidocaine. Using the modified Seldinger technique, a 7 Fr sheath was introduced into the right femoral artery.  A 6 Fr sheath was placed in the right femoral vein. A temporary transvenous pacing wire was advanced into the right ventricular apex under fluoro guidance. Weight-based heparin was given for anticoagulation. Once a therapeutic ACT was achieved, a 7 [place] XBLAD 3.5 guide catheter was inserted.  A prowater coronary guidewire was used to cross the lesion. This crossed the lesion but was unable to cross the tortuous segment in the mid vessel. We used a 1.5 OTW balloon and exchanged for a Whisper wire. This was passed into the distal LAD. We then used the OTW balloon to exchange for a Viper wire.The lesion was then treated with Orbital atherectomy using a Diamondback 1.25 mm Classic. A total of 4 runs were made in the proximal LAD at 80,000 rpm. After atherectomy angiography demonstrated a dissection in the mid vessel at the site of tortuosity. There was poor TIMI 1 flow distally. This was clearly distal to the segment treated with atherectomy and I felt this was related to crossing this area with the wire and balloon. She was given multiple doses of IC Ntg to alleviate spasm. The OTW balloon was used to exchange for a prowater wire to eliminate wire bias but flow was still reduced and there was clearly an area of dissection in the mid vessel. The mid vessel was then stented with a 2.5 x 28 mm Xience stent. The proximal vessel was then stented  with a 3.0 x 28 mm Xience stent in an overlapping fashion. The stents were postdilated with a 3.0 noncompliant balloon.  Following PCI, there was 0% residual stenosis and TIMI-3 flow. Final angiography confirmed an excellent result. The patient tolerated the procedure well. She did continue to have modest chest pain but was hemodynamically stable and ST changes had returned to baseline on the monitor. There were no immediate procedural complications. Femoral hemostasis was achieved with manual compression. The patient was transferred to the post catheterization recovery area for further monitoring.  Lesion Data: Vessel: LAD Percent stenosis (pre): 95% TIMI-flow (pre):  3 Stent:  3.0 x 28 mm Xience and 2.5 x 28 mm Xience Percent stenosis (post): 0% TIMI-flow (post): 3  Conclusions: Successful orbital atherectomy and stenting of the proximal to mid LAD with DES.  Recommendations: Continue dual antiplatelet therapy for at least one year. Given patients frail condition I would not anticipate DC tomorrow.   Ander Slade [facility] 11/26/2013, 4:43 PM

## 2013-11-26 NOTE — Progress Notes (Signed)
Pl[facility] call to [facility] [place] for cardiology about patients increased BP. New order for PRN Hydralazine ordered. WIll continue to monitor.  Loni Muse, RN

## 2013-11-26 NOTE — H&P (View-Only) (Signed)
Patient Name: Dana Blackwell Date of Encounter: 11/26/2013     Principal Problem:   Unstable angina Active Problems:   HYPOTHYROIDISM   DM, TYPE II   HYPERLIPIDEMIA   OBESITY   HYPERTENSION   CAD 3V at cath 2006- medical Rx   CAROTID ARTERY DISEASE- moderate LICA 62-69% doppler Nov 2014    SUBJECTIVE  No chest pain overnight.  Awaiting PCI this afternoon by Dr. Martinique.  CURRENT MEDS . aspirin  81 mg Oral Daily  . atorvastatin  20 mg Oral Daily  . clopidogrel  600 mg Oral Once  . clopidogrel  75 mg Oral Q breakfast  . folic acid-pyridoxine-cyancobalamin  1 tablet Oral Daily  . furosemide  20 mg Oral Daily  . glyBURIDE  5 mg Oral Q breakfast  . insulin aspart  0-15 Units Subcutaneous TID WC  . insulin aspart  0-5 Units Subcutaneous QHS  . insulin glargine  8 Units Subcutaneous QHS  . levothyroxine  75 mcg Oral QAC breakfast  . lisinopril  5 mg Oral Daily  . metoprolol succinate  100 mg Oral Daily  . nitroGLYCERIN  0.5 inch Topical 4 times per day  . omega-3 acid ethyl esters  1 g Oral Daily  . pantoprazole  40 mg Oral Daily  . sertraline  50 mg Oral Daily  . sodium chloride  3 mL Intravenous Q12H    OBJECTIVE  Filed Vitals:   11/26/13 0000 11/26/13 0400 11/26/13 0428 11/26/13 0730  BP: 91/49  148/63 179/55  Pulse: 66  72 68  Temp:  99 F (37.2 C)  98.4 F (36.9 C)  TempSrc:  Oral  Oral  Resp: 24  18 13   Height:      Weight:  147 lb 11.3 oz (67 kg)    SpO2: 94%  97% 96%    Intake/Output Summary (Last 24 hours) at 11/26/13 1108 Last data filed at 11/26/13 1100  Gross per 24 hour  Intake 1537.43 ml  Output      2 ml  Net 1535.43 ml   Filed Weights   11/24/13 0419 11/25/13 0400 11/26/13 0400  Weight: 146 lb 9.7 oz (66.5 kg) 148 lb 13 oz (67.5 kg) 147 lb 11.3 oz (67 kg)    PHYSICAL EXAM  General: Pleasant, NAD. Neuro: Alert and oriented X 3. Moves all extremities spontaneously. Psych: Normal affect. HEENT:  Normal  Neck: Supple without  bruits or JVD. Lungs:  Resp regular and unlabored, CTA. Heart: RRR no s3, s4, or murmurs. Abdomen: Soft, non-tender, non-distended, BS + x 4.  Extremities: No clubbing, cyanosis or edema. DP/PT/ 2+ and equal bilaterally.  Right radial pulse very weak.  Accessory Clinical Findings  CBC  Recent Labs  11/25/13 0358 11/26/13 0315  WBC 9.5 10.7*  HGB 11.3* 11.4*  HCT 35.3* 35.1*  MCV 93.4 92.6  PLT 279 485   Basic Metabolic Panel  Recent Labs  11/25/13 1330 11/26/13 0315  NA 140 141  K 4.4 4.4  CL 103 106  CO2 22 20  GLUCOSE 238* 130*  BUN 21 28*  CREATININE 1.08 1.33*  CALCIUM 9.3 9.0   Liver Function Tests No results found for this basename: AST, ALT, ALKPHOS, BILITOT, PROT, ALBUMIN,  in the last 72 hours No results found for this basename: LIPASE, AMYLASE,  in the last 72 hours Cardiac Enzymes  Recent Labs  11/23/13 1251 11/23/13 1839 11/24/13 0112  TROPONINI <0.30 <0.30 <0.30   BNP No components found with this basename:  POCBNP,  D-Dimer No results found for this basename: DDIMER,  in the last 72 hours Hemoglobin A1C No results found for this basename: HGBA1C,  in the last 72 hours Fasting Lipid Panel No results found for this basename: CHOL, HDL, LDLCALC, TRIG, CHOLHDL, LDLDIRECT,  in the last 72 hours Thyroid Function Tests No results found for this basename: TSH, T4TOTAL, FREET3, T3FREE, THYROIDAB,  in the last 72 hours  TELE  Normal sinus rhythm  ECG    Radiology/Studies  Dg Chest 2 View  11/23/2013   CLINICAL DATA:  Chest pain.  EXAM: CHEST  2 VIEW  COMPARISON:  CT CHEST W/O CM dated 11/29/2010; DG CHEST 1V PORT dated 05/27/2010  FINDINGS: The lung volumes. Cardiac silhouette within normal limits. Atherosclerotic calcification in the aorta. There is diffuse prominence of the interstitial markings. Areas of increased density projects in the lung bases and there is blunting of the costophrenic angles. Degenerative changes are appreciated within the  shoulders and thoracolumbar spine. There is flattening of the hemidiaphragms increased AP diameter of the chest.  IMPRESSION: COPD  Interstitial findings likely reflecting pulmonary fibrosis. Atelectasis versus infiltrate versus scarring within the lung bases left greater than right. Small bilateral effusions.   Electronically Signed   By: Margaree Mackintosh M.D.   On: 11/23/2013 09:49    ASSESSMENT AND PLAN Crescendo angina pectoris with findings at cardiac catheterization as noted below:  Fluoroscopy reveals extensive mitral annular calcification as well as moderate diffuse coronary calcification.  1. Left main: Large vessel which trifurcated into the LAD, a small ramus intermediate vessel, a dominant left circumflex coronary artery.  2. LAD: Very calcified proximal segment with 80% proximal stenosis before the first diagonal vessel. It was diffuse disease of 50% after the diagonal.  3. Ramus Intermediate: 95% ostial stenosis in a small vessel.  4. Left circumflex: Large dominant vessel that has 70% proximal stenosis and a moderate size first marginal branch. There was 30-40% a degree of stenosis after the first marginal takeoff. There was an 80% distal circumflex stenosis of the PLA vessel after several small LV branches  5. Right coronary artery: Ostial calcification with 70% ostial narrowing and 80% diffuse proximal stenosis in the small nondominant right coronary artery  Left ventriculography revealed normal LV function with an ejection fraction of 60%. There was extensive mitral annular calcification. There was left ventricular hypertrophy.   Plan: The patient will undergo high-speed rotational atherectomy today on her LAD.  Her renal function is stable.Marland Kitchen Her blood pressure is mildly elevated and she is now on low-dose lisinopril. The patient is a diabetic.  She is being loaded with Plavix.    Signed, Darlin Coco MD

## 2013-11-27 DIAGNOSIS — E119 Type 2 diabetes mellitus without complications: Secondary | ICD-10-CM | POA: Diagnosis not present

## 2013-11-27 DIAGNOSIS — J449 Chronic obstructive pulmonary disease, unspecified: Secondary | ICD-10-CM | POA: Diagnosis not present

## 2013-11-27 DIAGNOSIS — I214 Non-ST elevation (NSTEMI) myocardial infarction: Secondary | ICD-10-CM | POA: Diagnosis not present

## 2013-11-27 DIAGNOSIS — I6529 Occlusion and stenosis of unspecified carotid artery: Secondary | ICD-10-CM | POA: Diagnosis not present

## 2013-11-27 LAB — CBC
HEMATOCRIT: 34.1 % — AB (ref 36.0–46.0)
HEMOGLOBIN: 10.9 g/dL — AB (ref 12.0–15.0)
MCH: 29.9 pg (ref 26.0–34.0)
MCHC: 32 g/dL (ref 30.0–36.0)
MCV: 93.4 fL (ref 78.0–100.0)
Platelets: 280 10*3/uL (ref 150–400)
RBC: 3.65 MIL/uL — ABNORMAL LOW (ref 3.87–5.11)
RDW: 14.9 % (ref 11.5–15.5)
WBC: 11.2 10*3/uL — AB (ref 4.0–10.5)

## 2013-11-27 LAB — BASIC METABOLIC PANEL
BUN: 24 mg/dL — AB (ref 6–23)
CO2: 24 mEq/L (ref 19–32)
Calcium: 9 mg/dL (ref 8.4–10.5)
Chloride: 106 mEq/L (ref 96–112)
Creatinine, Ser: 1.15 mg/dL — ABNORMAL HIGH (ref 0.50–1.10)
GFR calc non Af Amer: 43 mL/min — ABNORMAL LOW (ref >90)
GFR, EST AFRICAN AMERICAN: 50 mL/min — AB (ref >90)
GLUCOSE: 276 mg/dL — AB (ref 70–99)
POTASSIUM: 5 meq/L (ref 3.7–5.3)
Sodium: 143 mEq/L (ref 137–147)

## 2013-11-27 LAB — GLUCOSE, CAPILLARY
Glucose-Capillary: 155 mg/dL — ABNORMAL HIGH (ref 70–99)
Glucose-Capillary: 307 mg/dL — ABNORMAL HIGH (ref 70–99)
Glucose-Capillary: 195 mg/dL — ABNORMAL HIGH (ref 70–99)
Glucose-Capillary: 186 mg/dL — ABNORMAL HIGH (ref 70–99)

## 2013-11-27 LAB — POCT ACTIVATED CLOTTING TIME: ACTIVATED CLOTTING TIME: 160 s

## 2013-11-27 MED ORDER — INSULIN GLARGINE 100 UNIT/ML ~~LOC~~ SOLN
10.0000 [IU] | Freq: Every day | SUBCUTANEOUS | Status: DC
  Administered 2013-11-27: 6 [IU] via SUBCUTANEOUS
  Filled 2013-11-27 (×2): qty 0.1

## 2013-11-27 MED ORDER — ISOSORBIDE MONONITRATE ER 30 MG PO TB24
30.0000 mg | ORAL_TABLET | Freq: Every day | ORAL | Status: DC
  Administered 2013-11-27 – 2013-11-28 (×2): 30 mg via ORAL
  Filled 2013-11-27 (×2): qty 1

## 2013-11-27 MED ORDER — INSULIN ASPART 100 UNIT/ML ~~LOC~~ SOLN
0.0000 [IU] | Freq: Three times a day (TID) | SUBCUTANEOUS | Status: DC
  Administered 2013-11-27: 2 [IU] via SUBCUTANEOUS
  Administered 2013-11-27: 7 [IU] via SUBCUTANEOUS
  Administered 2013-11-28: 1 [IU] via SUBCUTANEOUS
  Administered 2013-11-28: 3 [IU] via SUBCUTANEOUS

## 2013-11-27 NOTE — Progress Notes (Signed)
Inpatient Diabetes Program Recommendations  AACE/ADA: New Consensus Statement on Inpatient Glycemic Control (2013)  Target Ranges:  Prepandial:   less than 140 mg/dL      Peak postprandial:   less than 180 mg/dL (1-2 hours)      Critically ill patients:  140 - 180 mg/dL  Results for SHAWNTAVIA, SAUNDERS (MRN [id number]) as of 11/27/2013 09:42  Ref. Range 11/26/2013 07:29 11/26/2013 12:30 11/26/2013 22:54 11/27/2013 08:02  Glucose-Capillary Latest Range: 70-99 mg/dL 125 (H) 143 (H) 253 (H) 155 (H)   Inpatient Diabetes Program Recommendations Insulin - Basal: consider increase Lantus to 10 units  Correction (SSI): decrease to sensitive scale TID + HS Oral Agents: dc Glyburide  Oral hypoglycemics are not recommended during hospitalization.   Thank you  Raoul Pitch BSN, RN,CDE Inpatient Diabetes Coordinator [phone] (team pager)

## 2013-11-27 NOTE — Progress Notes (Addendum)
Patient Name: Dana Blackwell Date of Encounter: 11/27/2013     Principal Problem:   Unstable angina Active Problems:   HYPOTHYROIDISM   DM, TYPE II   HYPERLIPIDEMIA   OBESITY   HYPERTENSION   CAD 3V at cath 2006- medical Rx   CAROTID ARTERY DISEASE- moderate LICA 16-10% doppler Nov 2014    SUBJECTIVE  The patient is still having some mild chest pressure.  Her EKG today shows no ischemic changes and is within normal limits.  Her rhythm is stable normal sinus rhythm.  Blood pressure is higher today.  CURRENT MEDS . aspirin  81 mg Oral Daily  . atorvastatin  20 mg Oral Daily  . clopidogrel  75 mg Oral Q breakfast  . folic acid-pyridoxine-cyancobalamin  1 tablet Oral Daily  . furosemide  20 mg Oral Daily  . insulin aspart  0-15 Units Subcutaneous TID WC  . insulin aspart  0-5 Units Subcutaneous QHS  . insulin glargine  10 Units Subcutaneous QHS  . labetalol  10 mg Intravenous Once  . levothyroxine  75 mcg Oral QAC breakfast  . lisinopril  5 mg Oral Daily  . metoprolol succinate  100 mg Oral Daily  . nitroGLYCERIN  0.5 inch Topical 4 times per day  . omega-3 acid ethyl esters  1 g Oral Daily  . pantoprazole  40 mg Oral Daily  . sertraline  50 mg Oral Daily    OBJECTIVE  Filed Vitals:   11/27/13 0009 11/27/13 0300 11/27/13 0400 11/27/13 0803  BP: 119/40 158/50 158/50 165/46  Pulse: 75 68 68 70  Temp: 98.4 F (36.9 C) 97.3 F (36.3 C)  98.3 F (36.8 C)  TempSrc: Oral Oral  Oral  Resp: 24 17 17 14   Height:      Weight:  144 lb 13.5 oz (65.7 kg)    SpO2: 100% 100% 100% 100%    Intake/Output Summary (Last 24 hours) at 11/27/13 1118 Last data filed at 11/27/13 0600  Gross per 24 hour  Intake 936.98 ml  Output   1850 ml  Net -913.02 ml   Filed Weights   11/25/13 0400 11/26/13 0400 11/27/13 0300  Weight: 148 lb 13 oz (67.5 kg) 147 lb 11.3 oz (67 kg) 144 lb 13.5 oz (65.7 kg)    PHYSICAL EXAM  General: Pleasant, NAD. Neuro: Alert and oriented X 3.  Moves all extremities spontaneously. Psych: Normal affect. HEENT:  Normal  Neck: Supple without bruits or JVD. Lungs:  Resp regular and unlabored, CTA. Heart: RRR no s3, s4, or murmurs. Abdomen: Soft, non-tender, non-distended, BS + x 4.  Right groin is stable post catheterization. Extremities: No clubbing, cyanosis or edema. DP/PT/Radials 2+ and equal bilaterally.  Accessory Clinical Findings  CBC  Recent Labs  11/26/13 0315 11/27/13 0300  WBC 10.7* 11.2*  HGB 11.4* 10.9*  HCT 35.1* 34.1*  MCV 92.6 93.4  PLT 263 960   Basic Metabolic Panel  Recent Labs  11/26/13 0315 11/27/13 0300  NA 141 143  K 4.4 5.0  CL 106 106  CO2 20 24  GLUCOSE 130* 276*  BUN 28* 24*  CREATININE 1.33* 1.15*  CALCIUM 9.0 9.0   Liver Function Tests No results found for this basename: AST, ALT, ALKPHOS, BILITOT, PROT, ALBUMIN,  in the last 72 hours No results found for this basename: LIPASE, AMYLASE,  in the last 72 hours Cardiac Enzymes No results found for this basename: CKTOTAL, CKMB, CKMBINDEX, TROPONINI,  in the last 72 hours BNP No  components found with this basename: POCBNP,  D-Dimer No results found for this basename: DDIMER,  in the last 72 hours Hemoglobin A1C No results found for this basename: HGBA1C,  in the last 72 hours Fasting Lipid Panel No results found for this basename: CHOL, HDL, LDLCALC, TRIG, CHOLHDL, LDLDIRECT,  in the last 72 hours Thyroid Function Tests No results found for this basename: TSH, T4TOTAL, FREET3, T3FREE, THYROIDAB,  in the last 72 hours  TELE  Normal sinus rhythm  ECG  Normal sinus rhythm.  WNL.  Radiology/Studies  Dg Chest 2 View  11/23/2013   CLINICAL DATA:  Chest pain.  EXAM: CHEST  2 VIEW  COMPARISON:  CT CHEST W/O CM dated 11/29/2010; DG CHEST 1V PORT dated 05/27/2010  FINDINGS: The lung volumes. Cardiac silhouette within normal limits. Atherosclerotic calcification in the aorta. There is diffuse prominence of the interstitial markings.  Areas of increased density projects in the lung bases and there is blunting of the costophrenic angles. Degenerative changes are appreciated within the shoulders and thoracolumbar spine. There is flattening of the hemidiaphragms increased AP diameter of the chest.  IMPRESSION: COPD  Interstitial findings likely reflecting pulmonary fibrosis. Atelectasis versus infiltrate versus scarring within the lung bases left greater than right. Small bilateral effusions.   Electronically Signed   By: Margaree Mackintosh M.D.   On: 11/23/2013 09:49    ASSESSMENT AND PLAN 1. non-STEMI 2. Hypertension 3. Diabetes 4. hyper lipidemia 5. carotid artery disease 6. status post orbital atherectomy with PTCA and stenting of the LAD with DES.  Plan: Dual antiplatelet therapy for at least one year. Add isosorbide mononitrate.  Continue beta blocker, ACE inhibitor, statin. Ambulate with cardiac rehabilitation today.  Possibly home Friday if stable We have stopped the glyburide.  We have increased her Lantus.  We are checking a hemoglobin A1c   Signed, Darlin Coco MD

## 2013-11-27 NOTE — Progress Notes (Signed)
CARDIAC REHAB PHASE I   PRE:  Rate/Rhythm: 48 SR  BP:  Supine: 104/32  Sitting:   Standing:    SaO2: 100% 2L   MODE:  Ambulation: 270 ft   POST:  Rate/Rhythm: 90 SR  BP:  Supine:   Sitting: 124/34  Standing:    SaO2: 93%RA 1430-1520 Pt still on oxygen and stated had not been to chair yet.  Took on oxygen and sats above 90%. Pt stated she had urinary incontinence and needed to use BSC. Assisted pt to [facility]. Pt stated she needed mesh panty and pad before she could walk. Assisted pt with these. Pt walked 270 ft with asst x 1 and holding to IV pole. Did not want to use rolling walker. Has rollator at home. To chair after walk. No CP. Tolerated well. Started MI ed. Gave pt MI booklet and stent card. Reviewed NTG use and MI restrictions. Call bell in reach. Pt to call if she needs to get up. Pt stated needed to go home early because of her ride home. Friend leaving town later in day.   Graylon Good, RN BSN  11/27/2013 3:18 PM

## 2013-11-28 ENCOUNTER — Encounter (HOSPITAL_COMMUNITY): Payer: Self-pay | Admitting: Physician Assistant

## 2013-11-28 DIAGNOSIS — J449 Chronic obstructive pulmonary disease, unspecified: Secondary | ICD-10-CM | POA: Diagnosis present

## 2013-11-28 DIAGNOSIS — D649 Anemia, unspecified: Secondary | ICD-10-CM | POA: Diagnosis not present

## 2013-11-28 LAB — GLUCOSE, CAPILLARY
GLUCOSE-CAPILLARY: 249 mg/dL — AB (ref 70–99)
Glucose-Capillary: 142 mg/dL — ABNORMAL HIGH (ref 70–99)

## 2013-11-28 LAB — CBC
HCT: 30.8 % — ABNORMAL LOW (ref 36.0–46.0)
Hemoglobin: 10 g/dL — ABNORMAL LOW (ref 12.0–15.0)
MCH: 30.2 pg (ref 26.0–34.0)
MCHC: 32.5 g/dL (ref 30.0–36.0)
MCV: 93.1 fL (ref 78.0–100.0)
PLATELETS: 266 10*3/uL (ref 150–400)
RBC: 3.31 MIL/uL — ABNORMAL LOW (ref 3.87–5.11)
RDW: 14.9 % (ref 11.5–15.5)
WBC: 12.5 10*3/uL — AB (ref 4.0–10.5)

## 2013-11-28 LAB — BASIC METABOLIC PANEL
BUN: 27 mg/dL — ABNORMAL HIGH (ref 6–23)
CO2: 24 mEq/L (ref 19–32)
CREATININE: 1.22 mg/dL — AB (ref 0.50–1.10)
Calcium: 9 mg/dL (ref 8.4–10.5)
Chloride: 100 mEq/L (ref 96–112)
GFR, EST AFRICAN AMERICAN: 46 mL/min — AB (ref >90)
GFR, EST NON AFRICAN AMERICAN: 40 mL/min — AB (ref >90)
Glucose, Bld: 177 mg/dL — ABNORMAL HIGH (ref 70–99)
Potassium: 4.9 mEq/L (ref 3.7–5.3)
Sodium: 137 mEq/L (ref 137–147)

## 2013-11-28 LAB — HEMOGLOBIN A1C
HEMOGLOBIN A1C: 6.8 % — AB (ref <5.7)
Mean Plasma Glucose: 148 mg/dL — ABNORMAL HIGH (ref <117)

## 2013-11-28 MED ORDER — ASPIRIN 81 MG PO TBEC: 81.0000 mg | DELAYED_RELEASE_TABLET | Freq: Every day | ORAL | Status: DC

## 2013-11-28 MED ORDER — LISINOPRIL 5 MG PO TABS: 5.0000 mg | ORAL_TABLET | Freq: Every day | ORAL | Status: DC

## 2013-11-28 MED ORDER — INSULIN GLARGINE 100 UNIT/ML ~~LOC~~ SOLN: 10.0000 [IU] | Freq: Every day | SUBCUTANEOUS | Status: AC

## 2013-11-28 MED ORDER — ISOSORBIDE MONONITRATE ER 30 MG PO TB24: 30.0000 mg | ORAL_TABLET | Freq: Every day | ORAL | Status: DC

## 2013-11-28 MED ORDER — CLOPIDOGREL BISULFATE 75 MG PO TABS: 75.0000 mg | ORAL_TABLET | Freq: Every day | ORAL | Status: DC

## 2013-11-28 NOTE — Progress Notes (Addendum)
Patient Name: Dana Blackwell Date of Encounter: 11/28/2013     Principal Problem:   Unstable angina Active Problems:   HYPOTHYROIDISM   DM, TYPE II   HYPERLIPIDEMIA   OBESITY   HYPERTENSION   CAD 3V at cath 2006- medical Rx   CAROTID ARTERY DISEASE- moderate LICA 32-95% doppler Nov 2014    SUBJECTIVE  The patient had slight chest tightness when walking in the halls today without oxygen.  She then took a subsequent walk with nasal oxygen and had no discomfort.  Her oxygen level on room air with exercise drops to 87%.She has a history of COPD.  We will request home oxygen.  The patient will also benefit from a home health aide and home health RN.  She is elderly and frail and lives alone.  She does not drive. CURRENT MEDS . aspirin  81 mg Oral Daily  . atorvastatin  20 mg Oral Daily  . clopidogrel  75 mg Oral Q breakfast  . folic acid-pyridoxine-cyancobalamin  1 tablet Oral Daily  . furosemide  20 mg Oral Daily  . insulin aspart  0-5 Units Subcutaneous QHS  . insulin aspart  0-9 Units Subcutaneous TID WC  . insulin glargine  10 Units Subcutaneous QHS  . isosorbide mononitrate  30 mg Oral Daily  . labetalol  10 mg Intravenous Once  . levothyroxine  75 mcg Oral QAC breakfast  . lisinopril  5 mg Oral Daily  . metoprolol succinate  100 mg Oral Daily  . omega-3 acid ethyl esters  1 g Oral Daily  . pantoprazole  40 mg Oral Daily  . sertraline  50 mg Oral Daily    OBJECTIVE  Filed Vitals:   11/27/13 2345 11/28/13 0315 11/28/13 0400 11/28/13 0830  BP: 150/51 150/51    Pulse: 73 77  78  Temp:   98.5 F (36.9 C) 98 F (36.7 C)  TempSrc:   Oral Oral  Resp: 13 18  19   Height:      Weight:   148 lb 9.4 oz (67.4 kg)   SpO2: 98% 100%  92%    Intake/Output Summary (Last 24 hours) at 11/28/13 1122 Last data filed at 11/28/13 0800  Gross per 24 hour  Intake    880 ml  Output    500 ml  Net    380 ml   Filed Weights   11/26/13 0400 11/27/13 0300 11/28/13 0400    Weight: 147 lb 11.3 oz (67 kg) 144 lb 13.5 oz (65.7 kg) 148 lb 9.4 oz (67.4 kg)    PHYSICAL EXAM  General: Pleasant, NAD. Neuro: Alert and oriented X 3. Moves all extremities spontaneously. Psych: Normal affect. HEENT:  Normal  Neck: Supple without bruits or JVD. Lungs:  A few fine rales at bases bilaterally. Heart: RRR no s3, s4, or murmurs. Abdomen: Soft, non-tender, non-distended, BS + x 4.  Groin okay  Extremities: No clubbing, cyanosis or edema. DP/PT/Radials 2+ and equal bilaterally.  Accessory Clinical Findings  CBC  Recent Labs  11/27/13 0300 11/28/13 0356  WBC 11.2* 12.5*  HGB 10.9* 10.0*  HCT 34.1* 30.8*  MCV 93.4 93.1  PLT 280 188   Basic Metabolic Panel  Recent Labs  11/27/13 0300 11/28/13 0356  NA 143 137  K 5.0 4.9  CL 106 100  CO2 24 24  GLUCOSE 276* 177*  BUN 24* 27*  CREATININE 1.15* 1.22*  CALCIUM 9.0 9.0   Liver Function Tests No results found for this basename: AST,  ALT, ALKPHOS, BILITOT, PROT, ALBUMIN,  in the last 72 hours No results found for this basename: LIPASE, AMYLASE,  in the last 72 hours Cardiac Enzymes No results found for this basename: CKTOTAL, CKMB, CKMBINDEX, TROPONINI,  in the last 72 hours BNP No components found with this basename: POCBNP,  D-Dimer No results found for this basename: DDIMER,  in the last 72 hours Hemoglobin A1C No results found for this basename: HGBA1C,  in the last 72 hours Fasting Lipid Panel No results found for this basename: CHOL, HDL, LDLCALC, TRIG, CHOLHDL, LDLDIRECT,  in the last 72 hours Thyroid Function Tests No results found for this basename: TSH, T4TOTAL, FREET3, T3FREE, THYROIDAB,  in the last 72 hours  TELE  Normal sinus rhythm  ECG  Total sinus rhythm.  Within normal limits.  Radiology/Studies  Dg Chest 2 View  11/23/2013   CLINICAL DATA:  Chest pain.  EXAM: CHEST  2 VIEW  COMPARISON:  CT CHEST W/O CM dated 11/29/2010; DG CHEST 1V PORT dated 05/27/2010  FINDINGS: The lung  volumes. Cardiac silhouette within normal limits. Atherosclerotic calcification in the aorta. There is diffuse prominence of the interstitial markings. Areas of increased density projects in the lung bases and there is blunting of the costophrenic angles. Degenerative changes are appreciated within the shoulders and thoracolumbar spine. There is flattening of the hemidiaphragms increased AP diameter of the chest.  IMPRESSION: COPD  Interstitial findings likely reflecting pulmonary fibrosis. Atelectasis versus infiltrate versus scarring within the lung bases left greater than right. Small bilateral effusions.   Electronically Signed   By: Margaree Mackintosh M.D.   On: 11/23/2013 09:49    ASSESSMENT AND PLAN 1. non-STEMI  2. Hypertension  3. Diabetes  4. hyper lipidemia  5. carotid artery disease  6. status post orbital atherectomy with PTCA and stenting of the LAD with DES.  7. oxygen saturation drops with ambulation.. 8. COPD Plan: Dual antiplatelet therapy for at least one year.  Add isosorbide mononitrate. Continue beta blocker, ACE inhibitor, statin.  Ambulate with cardiac rehabilitation today. Possibly home Friday if stable  We have stopped the the home glyburide and metformin. We have increased her Lantus. We are checking a hemoglobin A1c, results pending.  Home with home health RN and aide and with home oxygen at 2 L a minute  Okay for discharge today.  Follow up with Dr. Burt Knack  Signed, Darlin Coco MD

## 2013-11-28 NOTE — Progress Notes (Signed)
CARDIAC REHAB PHASE I   PRE:  Rate/Rhythm: 87 SR  BP:  Supine:   Sitting: 111/48  Standing:    SaO2: 93%RA  MODE:  Ambulation: 350 ft   POST:  Rate/Rhythm: 89 SR  BP:  Supine:   Sitting: 153/44  Standing:    SaO2: 88% RA requested oxygen   91% 2L 0907-1010 Walked pt 350 ft on RA with rollator and she had to sit at 175 ft and take deep breaths due to mild chest tightness like an anxiety attack. Relieved with sitting and resting. Walked rest of way 175 ft and pt had same tightness and requested oxygen. 88%RA and to 91 % 2L.  Relieved within a minute of oxygen. Completed pt's education re ex and carb counting. Discussed CRP 2 and pt will consider. Will refer to [facility].  Asked pt if she felt up to walking again as I wanted to monitor sats whole walk as she had the episodes of chest tightness.   SATURATION QUALIFICATIONS: (This note is used to comply with regulatory documentation for home oxygen)  Patient Saturations on Room Air at Rest = 95%  Patient Saturations on Room Air while Ambulating = 87%  Patient Saturations on 2 Liters of oxygen while Ambulating = 95%  Please briefly explain why patient needs home oxygen: pt gets chest tightness walking if not on oxygent  Walked pt 350 ft again, checking sats whole walk.  Desat to 87% on RA and I applied 2L oxygen . Sats came up and stayed 95% rest of walk. Pt did not get chest tightness this walk. Did not need to sit. Felt much better walking on oxygen. Recommend pt goes home on home oxygen to use with activity.    Graylon Good, RN BSN  11/28/2013 9:58 AM

## 2013-11-28 NOTE — Discharge Summary (Signed)
CARDIOLOGY DISCHARGE SUMMARY   Patient ID: Dana Blackwell MRN: [id number] DOB/AGE: 78-Mar-1929 78 y.o.  Admit date: 11/23/2013 Discharge date: 12/01/2013  PCP: [name], MD Primary Cardiologist: Dr Burt Knack  Primary Discharge Diagnosis:  USAP, s/p 3.0 x 28 mm Xience and 2.5 x 28 mm Xience stents to the LAD  Secondary Discharge Diagnosis:    HYPOTHYROIDISM   DM, TYPE II   HYPERLIPIDEMIA   OBESITY   HYPERTENSION   CAD 3V at cath 2006- medical Rx   CAROTID ARTERY DISEASE- moderate LICA 78-29% doppler Nov 2014   COPD (chronic obstructive pulmonary disease)   Anemia   Procedures: Cardiac cath, coronary arteriogram, left ventriculogram, Orbital atherectomy with PTCA and stenting of the LAD    Hospital Course: [name] is a 80 y.o. female with a history of CAD, treated medically. She came to the ER with chest pressure and was admitted for further evaluation and treatment.  Her cardiac enzymes were negative for MI. Her symptoms resolved with medical therapy. Her initial creatinine was elevated but improved with hydration. The data were reviewed and cardiac cath was recommended. She was taken to the cath lab on 11/24/2013.   Cardiac catheterization results are below. She was hydrated and her renal function was followed closely. The films were reviewed and PCI was felt to be the best alternative. She was taken back to the cath lab on 11/26/2013.  Those results are below. She had PCI to the LAD, including DES x 2. She tolerated the procedure well. She had spasm during the procedure, but it resolved with IC NTG. She is to be on DAPT for a year and was started on oral nitrates.   She developed a mild anemia during her stay. This is felt secondary to blood loss from blood draws and procedures. Her MCV is normal. She will follow up as an outpatient and may need further evaluation if it does not improve.  Her diabetes medications were adjusted for better blood sugar  control and SSI was used as well. An A1c was drawn, results below. She is to follow up with primary care.   She was seen by cardiac rehab and educated on MI restrictions, CRF modifications and exercise guidelines.   With ambulation, her oxygen levels dropped and home oxygen is recommended, will be ordered. Case Management saw her and she will have a  home health RN/aide at discharge for assistance. She is felt to have deconditioning on top of COPD.  On 04/08, she was seen by Dr. Mare Ferrari and by cardiac rehab. With oxygen, she ambulated much better. No further inpatient workup is recommended and she is considered stable for discharge, to follow up as an outpatient.  Labs:  Lab Results  Component Value Date   WBC 12.5* 11/28/2013   HGB 10.0* 11/28/2013   HCT 30.8* 11/28/2013   MCV 93.1 11/28/2013   PLT 266 11/28/2013     Recent Labs Lab 11/28/13 0356  NA 137  K 4.9  CL 100  CO2 24  BUN 27*  CREATININE 1.22*  CALCIUM 9.0  GLUCOSE 177*   Lab Results  Component Value Date   TROPONINI <0.30 11/24/2013   Lab Results  Component Value Date   HGBA1C 6.8* 11/28/2013       Radiology: Dg Chest 2 View 11/23/2013   CLINICAL DATA:  Chest pain.  EXAM: CHEST  2 VIEW  COMPARISON:  CT CHEST W/O CM dated 11/29/2010; DG CHEST 1V PORT dated 05/27/2010  FINDINGS:  The lung volumes. Cardiac silhouette within normal limits. Atherosclerotic calcification in the aorta. There is diffuse prominence of the interstitial markings. Areas of increased density projects in the lung bases and there is blunting of the costophrenic angles. Degenerative changes are appreciated within the shoulders and thoracolumbar spine. There is flattening of the hemidiaphragms increased AP diameter of the chest.  IMPRESSION: COPD  Interstitial findings likely reflecting pulmonary fibrosis. Atelectasis versus infiltrate versus scarring within the lung bases left greater than right. Small bilateral effusions.   Electronically Signed   By:  Margaree Mackintosh M.D.   On: 11/23/2013 09:49    Cardiac Cath: 11/24/2013 ANGIOGRAPHY:  Fluoroscopy reveals extensive mitral annular calcification as well as moderate diffuse coronary calcification.  1. Left main: Large vessel which trifurcated into the LAD, a small ramus intermediate vessel, a dominant left circumflex coronary artery.  2. LAD: Very calcified proximal segment with 80% proximal stenosis before the first diagonal vessel. It was diffuse disease of 50% after the diagonal.  3. Ramus Intermediate: 95% ostial stenosis in a small vessel.  4. Left circumflex: Large dominant vessel that has 70% proximal stenosis and a moderate size first marginal branch. There was 30-40% a degree of stenosis after the first marginal takeoff. There was an 80% distal circumflex stenosis of the PLA vessel after several small LV branches  5. Right coronary artery: Ostial calcification with 70% ostial narrowing and 80% diffuse proximal stenosis in the small nondominant right coronary artery  Left ventriculography revealed normal LV function with an ejection fraction of 60%. There was extensive mitral annular calcification. There was left ventricular hypertrophy.  IMPRESSION:  Significant diffuse coronary calcification with three-vessel coronary obstructive disease and 80% proximal LAD stenosis before the first diagonal vessel with diffuse 50% mid LAD narrowing; 95% ostial stenosis of the diminutive ramus intermediate vessel; large dominant right coronary artery with 70% proximal OM1 stenosis, 30-40% AV groove stenosis after the marginal vessel and distal 80% stenosis involving the proximal portion of the PLA branch; ostial calcification with 70% ostial narrowing followed by diffuse 80% proximal right coronary artery narrowing.  RECOMMENDATION:  Angiographic findings will be reviewed with the patient's primary cardiologist, Dr. Burt Knack. The patient does have significant coronary artery calcification with high grade very  proximal LAD stenosis. The patient will be hydrated. If percutaneous coronary intervention is pursued, high-speed rotational atherectomy may be necessary due to the significant coronary calcification.  Recath: 11/26/2013 Procedure: Orbital atherectomy with PTCA and stenting of the LAD.  Indication: 80 yo WF presented with a NSTEMI. Diagnostic cardiac cath demonstrated a severe stenosis in the proximal LAD. The vessel was heavily calcified. There was tortuousity in the mid vessel. She has a left dominant system.  Procedural Details: The right groin was prepped, draped, and anesthetized with 1% lidocaine. Using the modified Seldinger technique, a 7 Fr sheath was introduced into the right femoral artery. A 6 Fr sheath was placed in the right femoral vein. A temporary transvenous pacing wire was advanced into the right ventricular apex under fluoro guidance. Weight-based heparin was given for anticoagulation. Once a therapeutic ACT was achieved, a 7 [place] XBLAD 3.5 guide catheter was inserted. A prowater coronary guidewire was used to cross the lesion. This crossed the lesion but was unable to cross the tortuous segment in the mid vessel. We used a 1.5 OTW balloon and exchanged for a Whisper wire. This was passed into the distal LAD. We then used the OTW balloon to exchange for a Viper wire.The lesion was then  treated with Orbital atherectomy using a Diamondback 1.25 mm Classic. A total of 4 runs were made in the proximal LAD at 80,000 rpm. After atherectomy angiography demonstrated a dissection in the mid vessel at the site of tortuosity. There was poor TIMI 1 flow distally. This was clearly distal to the segment treated with atherectomy and I felt this was related to crossing this area with the wire and balloon. She was given multiple doses of IC Ntg to alleviate spasm. The OTW balloon was used to exchange for a prowater wire to eliminate wire bias but flow was still reduced and there was clearly an area of  dissection in the mid vessel. The mid vessel was then stented with a 2.5 x 28 mm Xience stent. The proximal vessel was then stented with a 3.0 x 28 mm Xience stent in an overlapping fashion. The stents were postdilated with a 3.0 noncompliant balloon. Following PCI, there was 0% residual stenosis and TIMI-3 flow. Final angiography confirmed an excellent result. The patient tolerated the procedure well. She did continue to have modest chest pain but was hemodynamically stable and ST changes had returned to baseline on the monitor. There were no immediate procedural complications. Femoral hemostasis was achieved with manual compression. The patient was transferred to the post catheterization recovery area for further monitoring.  Lesion Data:  Vessel: LAD  Percent stenosis (pre): 95%  TIMI-flow (pre): 3  Stent: 3.0 x 28 mm Xience and 2.5 x 28 mm Xience  Percent stenosis (post): 0%  TIMI-flow (post): 3  EKG: 27-Nov-2013 07:42:22  Normal sinus rhythm Vent. rate 70 BPM PR interval 154 ms QRS duration 90 ms QT/QTc 416/449 ms P-R-T axes 39 -11 17  FOLLOW UP PLANS AND APPOINTMENTS Allergies  Allergen Reactions  . Tetanus Toxoid      Medication List    STOP taking these medications       glyBURIDE 5 MG tablet  Commonly known as:  DIABETA     HUMALOG 100 UNIT/ML injection  Generic drug:  insulin lispro     isosorbide dinitrate 20 MG tablet  Commonly known as:  ISORDIL     metFORMIN 500 MG tablet  Commonly known as:  GLUCOPHAGE      TAKE these medications       acetaminophen 650 MG CR tablet  Commonly known as:  TYLENOL  Take 650 mg by mouth daily.     aspirin 81 MG EC tablet  Take 1 tablet (81 mg total) by mouth daily.     atorvastatin 20 MG tablet  Commonly known as:  LIPITOR  Take 20 mg by mouth daily.     beta carotene w/minerals tablet  Take 1 tablet by mouth daily.     calcium carbonate 600 MG Tabs tablet  Commonly known as:  OS-CAL  Take 600 mg by mouth daily.      clopidogrel 75 MG tablet  Commonly known as:  PLAVIX  Take 1 tablet (75 mg total) by mouth daily with breakfast.     fish oil-omega-3 fatty acids 1000 MG capsule  Take 435 mg by mouth daily.     FOLTX 2.5-25-2 MG Tabs  Generic drug:  folic acid-pyridoxine-cyancobalamin  Take 1 tablet by mouth daily.     furosemide 40 MG tablet  Commonly known as:  LASIX  Take 20 mg by mouth daily.     hydrocodone-acetaminophen 5-500 MG per capsule  Commonly known as:  LORCET-HD  Take 1 capsule by mouth daily.  insulin glargine 100 UNIT/ML injection  Commonly known as:  LANTUS  Inject 0.1 mLs (10 Units total) into the skin at bedtime.     isosorbide mononitrate 30 MG 24 hr tablet  Commonly known as:  IMDUR  Take 1 tablet (30 mg total) by mouth daily.     levothyroxine 75 MCG tablet  Commonly known as:  SYNTHROID, LEVOTHROID  Take 75 mcg by mouth daily.     lisinopril 5 MG tablet  Commonly known as:  PRINIVIL,ZESTRIL  Take 1 tablet (5 mg total) by mouth daily.     MAGNESIUM PO  Take 1 tablet by mouth daily.     metoprolol succinate 100 MG 24 hr tablet  Commonly known as:  TOPROL-XL  Take 100 mg by mouth daily.     nitrofurantoin 100 MG capsule  Commonly known as:  MACRODANTIN  Take 100 mg by mouth daily.     pantoprazole 40 MG tablet  Commonly known as:  PROTONIX  Take 40 mg by mouth daily.     predniSONE 10 MG tablet  Commonly known as:  STERAPRED UNI-PAK  Take 5 mg by mouth daily.     sertraline 100 MG tablet  Commonly known as:  ZOLOFT  Take 50 mg by mouth daily.     VITAMIN B-12 PO  Take 1 tablet by mouth daily.        Discharge Orders   Future Appointments Provider Department Dept Phone   12/05/2013 3:15 PM Sherren Mocha, MD [place] [facility] [phone]   12/08/2013 12:30 PM Hayden Pedro, MD [place] [phone]   Future Orders Complete By Expires   Amb Referral to Cardiac Rehabilitation  As directed    Diet  - low sodium heart healthy  As directed    Diet Carb Modified  As directed    Increase activity slowly  As directed        BRING ALL MEDICATIONS WITH YOU TO FOLLOW UP APPOINTMENTS  Time spent with patient to include physician time: 38 min Signed: Lonn Georgia, PA-C 12/01/2013, 8:23 AM Co-Sign MD

## 2013-11-28 NOTE — Progress Notes (Signed)
Reviewed discharge instructions with patient. Verbally understood. Escorted out via wheelchair. 2 IV's removed  Ricki Rodriguez

## 2013-12-03 ENCOUNTER — Encounter: Payer: Self-pay | Admitting: Cardiovascular Disease

## 2013-12-05 ENCOUNTER — Ambulatory Visit (INDEPENDENT_AMBULATORY_CARE_PROVIDER_SITE_OTHER): Payer: Commercial Managed Care - HMO | Admitting: Cardiovascular Disease

## 2013-12-05 ENCOUNTER — Encounter: Payer: Self-pay | Admitting: Cardiovascular Disease

## 2013-12-05 VITALS — BP 146/66 | HR 63 | Ht <= 58 in | Wt 143.0 lb

## 2013-12-05 DIAGNOSIS — I2510 Atherosclerotic heart disease of native coronary artery without angina pectoris: Secondary | ICD-10-CM | POA: Diagnosis not present

## 2013-12-05 DIAGNOSIS — E785 Hyperlipidemia, unspecified: Secondary | ICD-10-CM

## 2013-12-05 NOTE — Progress Notes (Signed)
HPI:  80 year old woman presenting for followup of coronary artery disease. She had been managed medically for many years with moderate three-vessel CAD. However, she was recently hospitalized with acute coronary syndrome. Cardiac catheterization revealed diffuse three-vessel CAD with severe stenosis and heavy calcification of the proximal LAD. She underwent orbital atherectomy and stenting of the proximal and mid LAD with drug-eluting stent platforms.  The patient is doing well since hospital discharge. She initially was on oxygen, but no longer requires this. She denies shortness of breath, chest pain, or chest pressure. She was able to do some chores around her house today and did fine with that. She did have to rest afterwards. She denies leg swelling. She seems to be tolerating her medicines well and denies any bleeding problems on aspirin and Plavix. She does admit to worsening glycemic control. She is going to see Dr. Maudie Mercury in the next few weeks for adjustment in her medicines.  Outpatient Encounter Prescriptions as of 12/05/2013  Medication Sig  . acetaminophen (TYLENOL) 650 MG CR tablet Take 650 mg by mouth daily.   Marland Kitchen aspirin 81 MG EC tablet Take 1 tablet (81 mg total) by mouth daily.  Marland Kitchen atorvastatin (LIPITOR) 20 MG tablet Take 20 mg by mouth daily.  . beta carotene w/minerals (OCUVITE) tablet Take 1 tablet by mouth daily.   . calcium carbonate (OS-CAL) 600 MG TABS tablet Take 600 mg by mouth daily.  . clopidogrel (PLAVIX) 75 MG tablet Take 1 tablet (75 mg total) by mouth daily with breakfast.  . Cyanocobalamin (VITAMIN B-12 PO) Take 1 tablet by mouth daily.  . fish oil-omega-3 fatty acids 1000 MG capsule Take 435 mg by mouth daily.  . folic acid-pyridoxine-cyancobalamin (FOLTX) 2.5-25-2 MG TABS Take 1 tablet by mouth daily.   . furosemide (LASIX) 40 MG tablet Take 20 mg by mouth daily.   . hydrocodone-acetaminophen (LORCET-HD) 5-500 MG per capsule Take 1 capsule by mouth daily.   .  insulin glargine (LANTUS) 100 UNIT/ML injection Inject 0.1 mLs (10 Units total) into the skin at bedtime.  . isosorbide mononitrate (IMDUR) 30 MG 24 hr tablet Take 1 tablet (30 mg total) by mouth daily.  Marland Kitchen levothyroxine (SYNTHROID, LEVOTHROID) 75 MCG tablet Take 75 mcg by mouth daily.    Marland Kitchen lisinopril (PRINIVIL,ZESTRIL) 5 MG tablet Take 1 tablet (5 mg total) by mouth daily.  Marland Kitchen MAGNESIUM PO Take 1 tablet by mouth daily.  . metoprolol (TOPROL-XL) 100 MG 24 hr tablet Take 100 mg by mouth daily.   . nitrofurantoin (MACRODANTIN) 100 MG capsule Take 100 mg by mouth daily.  . pantoprazole (PROTONIX) 40 MG tablet Take 40 mg by mouth daily.    . predniSONE (STERAPRED UNI-PAK) 10 MG tablet Take 5 mg by mouth daily.  . sertraline (ZOLOFT) 100 MG tablet Take 50 mg by mouth daily.     Allergies  Allergen Reactions  . Tetanus Toxoid     Past Medical History  Diagnosis Date  . Coronary artery disease     11/2012: s/p 3.0 x 28 mm Xience and 2.5 x 28 mm Xience stents to the LAD    . Hypertension   . Hyperlipidemia   . Stroke     hx of  . Obesity   . Heart murmur   . Diabetes mellitus     type II  . Hypothyroidism   . Esophageal stricture   . Gastritis   . Nausea and vomiting   . Diarrhea   . Abdominal pain   .  Esophageal reflux   . Depression   . Uterine cancer   . H/O: hysterectomy     ROS: Negative except as per HPI  BP 146/66  Pulse 63  Ht 4\' 10"  (1.473 m)  Wt 143 lb (64.864 kg)  BMI 29.89 kg/m2  PHYSICAL EXAM: Pt is alert and oriented, pleasant elderly woman in NAD HEENT: normal Neck: JVP - normal, carotids 2+= without bruits Lungs: CTA bilaterally CV: RRR without murmur or gallop Abd: soft, NT, Positive BS, no hepatomegaly Ext: no C/C/E, distal pulses intact and equal Skin: warm/dry no rash  EKG:  Normal sinus rhythm 63 beats per minute, nonspecific ST abnormality peer  ASSESSMENT AND PLAN: 1. Coronary artery disease with recent non-ST elevation MI. The patient is  stable after PCI. She does not have significant LV dysfunction. She will continue on her current medical program. Her antianginal program includes metoprolol and Imdur. I would like to see her back in 4 months. She may resume driving.  2. Hypertension. Blood pressure is controlled on a combination of Imdur, lisinopril, and metoprolol succinate.  3. Hyperlipidemia. The patient is on atorvastatin.  For followup I will see her back in 4 months.  Sherren Mocha 12/05/2013 3:23 PM

## 2013-12-05 NOTE — Patient Instructions (Signed)
Your physician wants you to follow-up in: 4 MONTHS with Dr Cooper.  You will receive a reminder letter in the mail two months in advance. If you don't receive a letter, please call our office to schedule the follow-up appointment.  Your physician recommends that you continue on your current medications as directed. Please refer to the Current Medication list given to you today.  

## 2013-12-08 ENCOUNTER — Ambulatory Visit (INDEPENDENT_AMBULATORY_CARE_PROVIDER_SITE_OTHER): Payer: Medicare HMO | Admitting: Ophthalmology

## 2013-12-08 DIAGNOSIS — E11319 Type 2 diabetes mellitus with unspecified diabetic retinopathy without macular edema: Secondary | ICD-10-CM

## 2013-12-08 DIAGNOSIS — H3410 Central retinal artery occlusion, unspecified eye: Secondary | ICD-10-CM

## 2013-12-08 DIAGNOSIS — I10 Essential (primary) hypertension: Secondary | ICD-10-CM

## 2013-12-08 DIAGNOSIS — H26499 Other secondary cataract, unspecified eye: Secondary | ICD-10-CM

## 2013-12-08 DIAGNOSIS — H43819 Vitreous degeneration, unspecified eye: Secondary | ICD-10-CM

## 2013-12-08 DIAGNOSIS — E1165 Type 2 diabetes mellitus with hyperglycemia: Secondary | ICD-10-CM

## 2013-12-08 DIAGNOSIS — H35039 Hypertensive retinopathy, unspecified eye: Secondary | ICD-10-CM

## 2013-12-08 DIAGNOSIS — E1139 Type 2 diabetes mellitus with other diabetic ophthalmic complication: Secondary | ICD-10-CM

## 2013-12-09 ENCOUNTER — Telehealth: Payer: Self-pay | Admitting: Cardiovascular Disease

## 2013-12-09 NOTE — Telephone Encounter (Signed)
I spoke with Dana Blackwell and made her aware that we are in the process of referring the pt to cardiac rehab and we would recommend this therapy rather than PT.  Per Dana Blackwell the pt also said she is feeling well and plans to see her PCP later this week to discuss her glucose levels.

## 2013-12-09 NOTE — Telephone Encounter (Signed)
Left message on machine for [name] to contact the office.

## 2013-12-09 NOTE — Telephone Encounter (Signed)
F/u   [name] returning your call.

## 2013-12-09 NOTE — Telephone Encounter (Signed)
New problem    [facility] [place] needs nursing orders and physical therapy.   [name] also needs a call back please.

## 2013-12-12 ENCOUNTER — Encounter: Payer: Self-pay | Admitting: Cardiovascular Disease

## 2013-12-18 ENCOUNTER — Inpatient Hospital Stay (HOSPITAL_COMMUNITY): Admission: RE | Admit: 2013-12-18 | Payer: Medicare HMO | Source: Ambulatory Visit

## 2013-12-22 ENCOUNTER — Encounter (HOSPITAL_COMMUNITY): Payer: Medicare HMO

## 2013-12-24 ENCOUNTER — Encounter (HOSPITAL_COMMUNITY): Payer: Medicare HMO

## 2013-12-24 ENCOUNTER — Ambulatory Visit (INDEPENDENT_AMBULATORY_CARE_PROVIDER_SITE_OTHER): Payer: Medicare HMO | Admitting: Ophthalmology

## 2013-12-24 DIAGNOSIS — H2700 Aphakia, unspecified eye: Secondary | ICD-10-CM

## 2013-12-25 ENCOUNTER — Encounter (HOSPITAL_COMMUNITY)
Admission: RE | Admit: 2013-12-25 | Discharge: 2013-12-25 | Disposition: A | Discharge status: Home / Self Care | Payer: Medicare HMO | Source: Ambulatory Visit | Attending: Cardiovascular Disease | Admitting: Cardiovascular Disease

## 2013-12-25 DIAGNOSIS — Z5189 Encounter for other specified aftercare: Secondary | ICD-10-CM | POA: Insufficient documentation

## 2013-12-25 DIAGNOSIS — I10 Essential (primary) hypertension: Secondary | ICD-10-CM | POA: Insufficient documentation

## 2013-12-25 DIAGNOSIS — I5032 Chronic diastolic (congestive) heart failure: Secondary | ICD-10-CM | POA: Insufficient documentation

## 2013-12-25 DIAGNOSIS — I6529 Occlusion and stenosis of unspecified carotid artery: Secondary | ICD-10-CM | POA: Insufficient documentation

## 2013-12-25 DIAGNOSIS — I2510 Atherosclerotic heart disease of native coronary artery without angina pectoris: Secondary | ICD-10-CM | POA: Insufficient documentation

## 2013-12-25 DIAGNOSIS — I6350 Cerebral infarction due to unspecified occlusion or stenosis of unspecified cerebral artery: Secondary | ICD-10-CM | POA: Insufficient documentation

## 2013-12-25 NOTE — Progress Notes (Signed)
Cardiac Rehab Medication Review by a Pharmacist  Does the patient  feel that his/her medications are working for him/her?  yes  Has the patient been experiencing any side effects to the medications prescribed?  no  Does the patient measure his/her own blood pressure or blood glucose at home?  yes   Does the patient have any problems obtaining medications due to transportation or finances?   no  Understanding of regimen: fair Understanding of indications: fair Potential of compliance: fair    Pharmacist comments:   Dana Blackwell is a 85yoWF who presents today with assistance of oxygen. She has a relatively extensive medication list, lives alone, and self-manages her medications. She also has a hearing aid and was having some difficulty hearing until she changed her battery mid-visit. She is able to recall many of her medications but will likely need reinforcement of medications for optimal adherence. I have updated all of her medications and allergies.  Dana Blackwell. Dana Blackwell, PharmD Clinical Pharmacist - Resident Phone: [phone] Pager: [phone] 12/25/2013 9:12 AM

## 2013-12-26 ENCOUNTER — Encounter (HOSPITAL_COMMUNITY): Payer: Medicare HMO

## 2013-12-29 ENCOUNTER — Encounter (HOSPITAL_COMMUNITY): Payer: Medicare HMO

## 2013-12-31 ENCOUNTER — Encounter (HOSPITAL_COMMUNITY)
Admission: RE | Admit: 2013-12-31 | Discharge: 2013-12-31 | Disposition: A | Discharge status: Home / Self Care | Payer: Medicare HMO | Source: Ambulatory Visit | Attending: Cardiovascular Disease | Admitting: Cardiovascular Disease

## 2013-12-31 DIAGNOSIS — I5032 Chronic diastolic (congestive) heart failure: Secondary | ICD-10-CM | POA: Diagnosis not present

## 2013-12-31 DIAGNOSIS — I6350 Cerebral infarction due to unspecified occlusion or stenosis of unspecified cerebral artery: Secondary | ICD-10-CM | POA: Diagnosis not present

## 2013-12-31 DIAGNOSIS — I6529 Occlusion and stenosis of unspecified carotid artery: Secondary | ICD-10-CM | POA: Diagnosis not present

## 2013-12-31 DIAGNOSIS — I2510 Atherosclerotic heart disease of native coronary artery without angina pectoris: Secondary | ICD-10-CM | POA: Diagnosis present

## 2013-12-31 DIAGNOSIS — I10 Essential (primary) hypertension: Secondary | ICD-10-CM | POA: Diagnosis not present

## 2013-12-31 DIAGNOSIS — Z5189 Encounter for other specified aftercare: Secondary | ICD-10-CM | POA: Diagnosis not present

## 2013-12-31 LAB — GLUCOSE, CAPILLARY
Glucose-Capillary: 180 mg/dL — ABNORMAL HIGH (ref 70–99)
Glucose-Capillary: 201 mg/dL — ABNORMAL HIGH (ref 70–99)

## 2013-12-31 NOTE — Progress Notes (Signed)
Pt in today for her first day of exercise in the cardiac rehab phase II program.  Pt arrived with the assistance of guest service via wheelchair.  Pt presents without her oxygen and complains of feeling tired.  O2 sat checked on RA 90%.  O2 applied at 2 lncc and o2 sat increased to 98%. Pt questioned why she doesn't wear her oxygen when she was out and about. Pt complained that the portable tank was too cumbersome to carry.  Will call [facility] and advise them of the situation.  Monitor showed SR with slight st depression.  Pt tolerated light exercise with some complaints of hip pain particularly when walking.  Pt encouraged to take rest breaks as needed. PhQ2 score 1.  Pt reports his short term goal is get her heart healthier and long term goal is to decrease sob, increase walking distance.  This is achievable for pt, will do home exercise instructions to maintain energy level.  Pt oxygen use will need to increase to wearing O2 all the time as ordered. Pt feels reluctant to do so.  Will talk with pt regarding the benefits of wearing O2.  Continue to monitor her progress toward meeting these goal.  Maurice Small RN, BSN

## 2014-01-02 ENCOUNTER — Encounter (HOSPITAL_COMMUNITY): Payer: Medicare HMO

## 2014-01-05 ENCOUNTER — Encounter (HOSPITAL_COMMUNITY)
Admission: RE | Admit: 2014-01-05 | Discharge: 2014-01-05 | Disposition: A | Discharge status: Home / Self Care | Payer: Medicare HMO | Source: Ambulatory Visit | Attending: Cardiovascular Disease | Admitting: Cardiovascular Disease

## 2014-01-05 DIAGNOSIS — Z5189 Encounter for other specified aftercare: Secondary | ICD-10-CM | POA: Diagnosis not present

## 2014-01-05 LAB — GLUCOSE, CAPILLARY: GLUCOSE-CAPILLARY: 174 mg/dL — AB (ref 70–99)

## 2014-01-07 ENCOUNTER — Encounter (HOSPITAL_COMMUNITY): Admission: RE | Admit: 2014-01-07 | Payer: Medicare HMO | Source: Ambulatory Visit

## 2014-01-07 ENCOUNTER — Telehealth (HOSPITAL_COMMUNITY): Payer: Self-pay | Admitting: *Deleted

## 2014-01-09 ENCOUNTER — Encounter (HOSPITAL_COMMUNITY): Admission: RE | Admit: 2014-01-09 | Payer: Medicare HMO | Source: Ambulatory Visit

## 2014-01-12 ENCOUNTER — Encounter (HOSPITAL_COMMUNITY): Payer: Medicare HMO

## 2014-01-14 ENCOUNTER — Encounter (HOSPITAL_COMMUNITY)
Admission: RE | Admit: 2014-01-14 | Discharge: 2014-01-14 | Disposition: A | Discharge status: Home / Self Care | Payer: Medicare HMO | Source: Ambulatory Visit | Attending: Cardiovascular Disease | Admitting: Cardiovascular Disease

## 2014-01-14 DIAGNOSIS — Z5189 Encounter for other specified aftercare: Secondary | ICD-10-CM | POA: Diagnosis not present

## 2014-01-16 ENCOUNTER — Encounter (HOSPITAL_COMMUNITY): Payer: Medicare HMO

## 2014-01-19 ENCOUNTER — Encounter (HOSPITAL_COMMUNITY): Payer: Medicare HMO

## 2014-01-19 ENCOUNTER — Telehealth (HOSPITAL_COMMUNITY): Payer: Self-pay | Admitting: Internal Medicine

## 2014-01-21 ENCOUNTER — Encounter (HOSPITAL_COMMUNITY): Payer: Medicare HMO

## 2014-01-23 ENCOUNTER — Encounter (HOSPITAL_COMMUNITY): Payer: Medicare HMO

## 2014-01-26 ENCOUNTER — Encounter (HOSPITAL_COMMUNITY): Payer: Medicare HMO

## 2014-01-28 ENCOUNTER — Encounter (HOSPITAL_COMMUNITY): Payer: Medicare HMO

## 2014-01-29 ENCOUNTER — Encounter: Payer: Self-pay | Admitting: Cardiovascular Disease

## 2014-01-29 NOTE — Telephone Encounter (Signed)
New Message:  Pt is requesting to be worked in to see Dr. Burt Knack. Pt does not want to see PA. Pt is requesting to speak w/ Ander Purpura

## 2014-01-29 NOTE — Telephone Encounter (Signed)
This encounter was created in error - please disregard.

## 2014-01-30 ENCOUNTER — Encounter (HOSPITAL_COMMUNITY): Payer: Medicare HMO

## 2014-02-02 ENCOUNTER — Encounter (HOSPITAL_COMMUNITY): Payer: Medicare HMO

## 2014-02-04 ENCOUNTER — Encounter (HOSPITAL_COMMUNITY): Payer: Medicare HMO

## 2014-02-05 ENCOUNTER — Telehealth (HOSPITAL_COMMUNITY): Payer: Self-pay | Admitting: *Deleted

## 2014-02-05 NOTE — Telephone Encounter (Signed)
Called and inquired regarding ongoing absence.  Pt with chronic back pain and can not continue to participate.  Pt has been in contact with her insurance company regarding silver sneaker program- water aerobics.

## 2014-02-06 ENCOUNTER — Encounter (HOSPITAL_COMMUNITY): Payer: Medicare HMO

## 2014-02-09 ENCOUNTER — Encounter (HOSPITAL_COMMUNITY): Payer: Medicare HMO

## 2014-02-11 ENCOUNTER — Encounter (HOSPITAL_COMMUNITY): Payer: Medicare HMO

## 2014-02-13 ENCOUNTER — Encounter (HOSPITAL_COMMUNITY): Payer: Medicare HMO

## 2014-02-16 ENCOUNTER — Encounter (HOSPITAL_COMMUNITY): Payer: Medicare HMO

## 2014-02-18 ENCOUNTER — Encounter (HOSPITAL_COMMUNITY): Payer: Medicare HMO

## 2014-02-19 ENCOUNTER — Encounter (HOSPITAL_COMMUNITY): Payer: Self-pay | Admitting: Emergency Medicine

## 2014-02-19 ENCOUNTER — Emergency Department (HOSPITAL_COMMUNITY): Payer: Medicare HMO

## 2014-02-19 ENCOUNTER — Inpatient Hospital Stay (HOSPITAL_COMMUNITY)
Admission: EM | Admit: 2014-02-19 | Discharge: 2014-02-27 | DRG: 086 | Disposition: A | Discharge status: Skilled Nursing Facility | Payer: Medicare HMO | Attending: Internal Medicine | Admitting: Internal Medicine

## 2014-02-19 DIAGNOSIS — E119 Type 2 diabetes mellitus without complications: Secondary | ICD-10-CM | POA: Diagnosis present

## 2014-02-19 DIAGNOSIS — K222 Esophageal obstruction: Secondary | ICD-10-CM

## 2014-02-19 DIAGNOSIS — S069XAA Unspecified intracranial injury with loss of consciousness status unknown, initial encounter: Secondary | ICD-10-CM

## 2014-02-19 DIAGNOSIS — J4489 Other specified chronic obstructive pulmonary disease: Secondary | ICD-10-CM | POA: Diagnosis present

## 2014-02-19 DIAGNOSIS — D649 Anemia, unspecified: Secondary | ICD-10-CM | POA: Diagnosis present

## 2014-02-19 DIAGNOSIS — I609 Nontraumatic subarachnoid hemorrhage, unspecified: Secondary | ICD-10-CM | POA: Diagnosis present

## 2014-02-19 DIAGNOSIS — Z794 Long term (current) use of insulin: Secondary | ICD-10-CM

## 2014-02-19 DIAGNOSIS — Z9981 Dependence on supplemental oxygen: Secondary | ICD-10-CM

## 2014-02-19 DIAGNOSIS — E039 Hypothyroidism, unspecified: Secondary | ICD-10-CM

## 2014-02-19 DIAGNOSIS — I272 Pulmonary hypertension, unspecified: Secondary | ICD-10-CM | POA: Diagnosis present

## 2014-02-19 DIAGNOSIS — Z8542 Personal history of malignant neoplasm of other parts of uterus: Secondary | ICD-10-CM

## 2014-02-19 DIAGNOSIS — F329 Major depressive disorder, single episode, unspecified: Secondary | ICD-10-CM

## 2014-02-19 DIAGNOSIS — Z79899 Other long term (current) drug therapy: Secondary | ICD-10-CM

## 2014-02-19 DIAGNOSIS — N183 Chronic kidney disease, stage 3 unspecified: Secondary | ICD-10-CM | POA: Diagnosis present

## 2014-02-19 DIAGNOSIS — F3289 Other specified depressive episodes: Secondary | ICD-10-CM

## 2014-02-19 DIAGNOSIS — Z9861 Coronary angioplasty status: Secondary | ICD-10-CM

## 2014-02-19 DIAGNOSIS — S066XAA Traumatic subarachnoid hemorrhage with loss of consciousness status unknown, initial encounter: Principal | ICD-10-CM | POA: Diagnosis present

## 2014-02-19 DIAGNOSIS — S2232XA Fracture of one rib, left side, initial encounter for closed fracture: Secondary | ICD-10-CM

## 2014-02-19 DIAGNOSIS — K5900 Constipation, unspecified: Secondary | ICD-10-CM | POA: Diagnosis not present

## 2014-02-19 DIAGNOSIS — I6529 Occlusion and stenosis of unspecified carotid artery: Secondary | ICD-10-CM

## 2014-02-19 DIAGNOSIS — I10 Essential (primary) hypertension: Secondary | ICD-10-CM

## 2014-02-19 DIAGNOSIS — IMO0002 Reserved for concepts with insufficient information to code with codable children: Secondary | ICD-10-CM

## 2014-02-19 DIAGNOSIS — K219 Gastro-esophageal reflux disease without esophagitis: Secondary | ICD-10-CM | POA: Diagnosis present

## 2014-02-19 DIAGNOSIS — J438 Other emphysema: Secondary | ICD-10-CM

## 2014-02-19 DIAGNOSIS — I129 Hypertensive chronic kidney disease with stage 1 through stage 4 chronic kidney disease, or unspecified chronic kidney disease: Secondary | ICD-10-CM | POA: Diagnosis present

## 2014-02-19 DIAGNOSIS — R55 Syncope and collapse: Secondary | ICD-10-CM

## 2014-02-19 DIAGNOSIS — I509 Heart failure, unspecified: Secondary | ICD-10-CM | POA: Diagnosis present

## 2014-02-19 DIAGNOSIS — Z8673 Personal history of transient ischemic attack (TIA), and cerebral infarction without residual deficits: Secondary | ICD-10-CM

## 2014-02-19 DIAGNOSIS — G4733 Obstructive sleep apnea (adult) (pediatric): Secondary | ICD-10-CM | POA: Diagnosis present

## 2014-02-19 DIAGNOSIS — S069X9A Unspecified intracranial injury with loss of consciousness of unspecified duration, initial encounter: Secondary | ICD-10-CM

## 2014-02-19 DIAGNOSIS — E669 Obesity, unspecified: Secondary | ICD-10-CM

## 2014-02-19 DIAGNOSIS — Z7982 Long term (current) use of aspirin: Secondary | ICD-10-CM

## 2014-02-19 DIAGNOSIS — S2239XA Fracture of one rib, unspecified side, initial encounter for closed fracture: Secondary | ICD-10-CM | POA: Diagnosis present

## 2014-02-19 DIAGNOSIS — J449 Chronic obstructive pulmonary disease, unspecified: Secondary | ICD-10-CM | POA: Diagnosis present

## 2014-02-19 DIAGNOSIS — I6350 Cerebral infarction due to unspecified occlusion or stenosis of unspecified cerebral artery: Secondary | ICD-10-CM

## 2014-02-19 DIAGNOSIS — I2510 Atherosclerotic heart disease of native coronary artery without angina pectoris: Secondary | ICD-10-CM | POA: Diagnosis present

## 2014-02-19 DIAGNOSIS — W19XXXA Unspecified fall, initial encounter: Secondary | ICD-10-CM | POA: Diagnosis present

## 2014-02-19 DIAGNOSIS — S066X9A Traumatic subarachnoid hemorrhage with loss of consciousness of unspecified duration, initial encounter: Principal | ICD-10-CM | POA: Diagnosis present

## 2014-02-19 DIAGNOSIS — Z7902 Long term (current) use of antithrombotics/antiplatelets: Secondary | ICD-10-CM

## 2014-02-19 DIAGNOSIS — I2789 Other specified pulmonary heart diseases: Secondary | ICD-10-CM | POA: Diagnosis present

## 2014-02-19 DIAGNOSIS — J9610 Chronic respiratory failure, unspecified whether with hypoxia or hypercapnia: Secondary | ICD-10-CM | POA: Diagnosis present

## 2014-02-19 DIAGNOSIS — E785 Hyperlipidemia, unspecified: Secondary | ICD-10-CM

## 2014-02-19 LAB — CBG MONITORING, ED: GLUCOSE-CAPILLARY: 183 mg/dL — AB (ref 70–99)

## 2014-02-19 LAB — CBC WITH DIFFERENTIAL/PLATELET
Basophils Absolute: 0 10*3/uL (ref 0.0–0.1)
Basophils Relative: 0 % (ref 0–1)
EOS ABS: 0.1 10*3/uL (ref 0.0–0.7)
EOS PCT: 2 % (ref 0–5)
HEMATOCRIT: 32.6 % — AB (ref 36.0–46.0)
HEMOGLOBIN: 9.9 g/dL — AB (ref 12.0–15.0)
LYMPHS ABS: 1.3 10*3/uL (ref 0.7–4.0)
LYMPHS PCT: 17 % (ref 12–46)
MCH: 28.1 pg (ref 26.0–34.0)
MCHC: 30.4 g/dL (ref 30.0–36.0)
MCV: 92.6 fL (ref 78.0–100.0)
MONOS PCT: 10 % (ref 3–12)
Monocytes Absolute: 0.8 10*3/uL (ref 0.1–1.0)
Neutro Abs: 5.5 10*3/uL (ref 1.7–7.7)
Neutrophils Relative %: 71 % (ref 43–77)
Platelets: 279 10*3/uL (ref 150–400)
RBC: 3.52 MIL/uL — AB (ref 3.87–5.11)
RDW: 14.9 % (ref 11.5–15.5)
WBC: 7.7 10*3/uL (ref 4.0–10.5)

## 2014-02-19 LAB — URINALYSIS, ROUTINE W REFLEX MICROSCOPIC
Bilirubin Urine: NEGATIVE
GLUCOSE, UA: NEGATIVE mg/dL
Hgb urine dipstick: NEGATIVE
KETONES UR: NEGATIVE mg/dL
Leukocytes, UA: NEGATIVE
NITRITE: NEGATIVE
PH: 6.5 (ref 5.0–8.0)
Protein, ur: NEGATIVE mg/dL
SPECIFIC GRAVITY, URINE: 1.011 (ref 1.005–1.030)
Urobilinogen, UA: 0.2 mg/dL (ref 0.0–1.0)

## 2014-02-19 LAB — ABO/RH: ABO/RH(D): A POS

## 2014-02-19 LAB — BASIC METABOLIC PANEL
Anion gap: 12 (ref 5–15)
BUN: 23 mg/dL (ref 6–23)
CO2: 27 meq/L (ref 19–32)
Calcium: 8.9 mg/dL (ref 8.4–10.5)
Chloride: 98 mEq/L (ref 96–112)
Creatinine, Ser: 1.2 mg/dL — ABNORMAL HIGH (ref 0.50–1.10)
GFR calc Af Amer: 47 mL/min — ABNORMAL LOW (ref >90)
GFR, EST NON AFRICAN AMERICAN: 41 mL/min — AB (ref >90)
GLUCOSE: 175 mg/dL — AB (ref 70–99)
Potassium: 4.8 mEq/L (ref 3.7–5.3)
Sodium: 137 mEq/L (ref 137–147)

## 2014-02-19 LAB — PROTIME-INR
INR: 1.04 (ref 0.00–1.49)
Prothrombin Time: 13.6 seconds (ref 11.6–15.2)

## 2014-02-19 LAB — GLUCOSE, CAPILLARY: Glucose-Capillary: 171 mg/dL — ABNORMAL HIGH (ref 70–99)

## 2014-02-19 LAB — TROPONIN I

## 2014-02-19 LAB — MRSA PCR SCREENING: MRSA by PCR: NEGATIVE

## 2014-02-19 MED ORDER — PREDNISONE 5 MG PO TABS
5.0000 mg | ORAL_TABLET | Freq: Every day | ORAL | Status: DC
  Filled 2014-02-19: qty 1

## 2014-02-19 MED ORDER — ATORVASTATIN CALCIUM 10 MG PO TABS
20.0000 mg | ORAL_TABLET | Freq: Every day | ORAL | Status: DC
  Administered 2014-02-20 – 2014-02-26 (×7): 20 mg via ORAL
  Filled 2014-02-19: qty 2
  Filled 2014-02-19: qty 1
  Filled 2014-02-19 (×6): qty 2
  Filled 2014-02-19: qty 1

## 2014-02-19 MED ORDER — INSULIN ASPART 100 UNIT/ML ~~LOC~~ SOLN
1.0000 [IU] | Freq: Three times a day (TID) | SUBCUTANEOUS | Status: DC
  Administered 2014-02-20: 3 [IU] via SUBCUTANEOUS
  Administered 2014-02-20: 2 [IU] via SUBCUTANEOUS
  Administered 2014-02-20 (×2): 1 [IU] via SUBCUTANEOUS
  Administered 2014-02-21: 2 [IU] via SUBCUTANEOUS
  Administered 2014-02-21: 1 [IU] via SUBCUTANEOUS

## 2014-02-19 MED ORDER — IOHEXOL 350 MG/ML SOLN
50.0000 mL | Freq: Once | INTRAVENOUS | Status: AC | PRN
Start: 2014-02-19 — End: 2014-02-19
  Administered 2014-02-19: 50 mL via INTRAVENOUS

## 2014-02-19 MED ORDER — SODIUM CHLORIDE 0.9 % IV SOLN
INTRAVENOUS | Status: DC
  Administered 2014-02-19: 23:00:00 via INTRAVENOUS

## 2014-02-19 MED ORDER — PANTOPRAZOLE SODIUM 40 MG PO TBEC
40.0000 mg | DELAYED_RELEASE_TABLET | Freq: Every day | ORAL | Status: DC
  Administered 2014-02-20 – 2014-02-27 (×8): 40 mg via ORAL
  Filled 2014-02-19 (×8): qty 1

## 2014-02-19 MED ORDER — METOPROLOL SUCCINATE ER 100 MG PO TB24
100.0000 mg | ORAL_TABLET | Freq: Every day | ORAL | Status: DC
Start: 2014-02-20 — End: 2014-02-27
  Administered 2014-02-20 – 2014-02-27 (×8): 100 mg via ORAL
  Filled 2014-02-19 (×8): qty 1

## 2014-02-19 MED ORDER — SODIUM CHLORIDE 0.9 % IV SOLN
INTRAVENOUS | Status: DC
Start: 2014-02-19 — End: 2014-02-21
  Administered 2014-02-19: 16:00:00 via INTRAVENOUS

## 2014-02-19 MED ORDER — ISOSORBIDE MONONITRATE ER 30 MG PO TB24
30.0000 mg | ORAL_TABLET | Freq: Every day | ORAL | Status: DC
Start: 2014-02-20 — End: 2014-02-27
  Administered 2014-02-20 – 2014-02-27 (×8): 30 mg via ORAL
  Filled 2014-02-19 (×8): qty 1

## 2014-02-19 MED ORDER — LISINOPRIL 5 MG PO TABS
5.0000 mg | ORAL_TABLET | Freq: Every day | ORAL | Status: DC
  Administered 2014-02-20 – 2014-02-21 (×2): 5 mg via ORAL
  Filled 2014-02-19 (×2): qty 1

## 2014-02-19 MED ORDER — SERTRALINE HCL 50 MG PO TABS
50.0000 mg | ORAL_TABLET | Freq: Every day | ORAL | Status: DC
  Administered 2014-02-20 – 2014-02-27 (×8): 50 mg via ORAL
  Filled 2014-02-19 (×8): qty 1

## 2014-02-19 MED ORDER — TRAMADOL HCL 50 MG PO TABS
50.0000 mg | ORAL_TABLET | Freq: Four times a day (QID) | ORAL | Status: DC | PRN
  Administered 2014-02-20 – 2014-02-24 (×3): 50 mg via ORAL
  Administered 2014-02-25 – 2014-02-26 (×3): 100 mg via ORAL
  Filled 2014-02-19 (×3): qty 2
  Filled 2014-02-19 (×3): qty 1

## 2014-02-19 MED ORDER — NICARDIPINE HCL IN NACL 20-0.86 MG/200ML-% IV SOLN
3.0000 mg/h | Freq: Once | INTRAVENOUS | Status: AC
  Administered 2014-02-19: 3 mg/h via INTRAVENOUS
  Filled 2014-02-19: qty 200

## 2014-02-19 MED ORDER — INSULIN ASPART 100 UNIT/ML ~~LOC~~ SOLN: 1.0000 [IU] | SUBCUTANEOUS | Status: DC

## 2014-02-19 MED ORDER — HYDROCODONE-ACETAMINOPHEN 5-325 MG PO TABS
1.0000 | ORAL_TABLET | Freq: Four times a day (QID) | ORAL | Status: DC | PRN
  Administered 2014-02-19: 2 via ORAL
  Administered 2014-02-20 – 2014-02-23 (×8): 1 via ORAL
  Administered 2014-02-27 (×2): 2 via ORAL
  Filled 2014-02-19 (×2): qty 1
  Filled 2014-02-19: qty 2
  Filled 2014-02-19 (×5): qty 1
  Filled 2014-02-19: qty 2
  Filled 2014-02-19 (×2): qty 1
  Filled 2014-02-19: qty 2

## 2014-02-19 MED ORDER — SODIUM CHLORIDE 0.9 % IV SOLN
250.0000 mL | INTRAVENOUS | Status: DC | PRN
Start: 2014-02-19 — End: 2014-02-21

## 2014-02-19 MED ORDER — LEVOTHYROXINE SODIUM 50 MCG PO TABS
75.0000 ug | ORAL_TABLET | Freq: Every day | ORAL | Status: DC
  Administered 2014-02-20 – 2014-02-27 (×8): 75 ug via ORAL
  Filled 2014-02-19 (×13): qty 1

## 2014-02-19 MED ORDER — IPRATROPIUM-ALBUTEROL 0.5-2.5 (3) MG/3ML IN SOLN: 3.0000 mL | Freq: Four times a day (QID) | RESPIRATORY_TRACT | Status: DC | PRN

## 2014-02-19 MED ORDER — INSULIN GLARGINE 100 UNIT/ML ~~LOC~~ SOLN
10.0000 [IU] | Freq: Every day | SUBCUTANEOUS | Status: DC
  Administered 2014-02-19 – 2014-02-26 (×8): 10 [IU] via SUBCUTANEOUS
  Filled 2014-02-19 (×9): qty 0.1

## 2014-02-19 NOTE — Consult Note (Signed)
Reason for Consult:TBI Referring Physician: Deeanna Beightol Blackwell is an 80 y.o. female.  HPI: Dana Blackwell was at home and about to go to [employer]. She was ambulating with her walker as she usually does. The last thing she remembers is looking down to turn on her oxygen and then waking up with EMS in attendance. She was brought in to [facility] but was not a trauma activation. She complains of chest and back pain diffusely.  Past Medical History  Diagnosis Date  . Coronary artery disease     11/2012: s/p 3.0 x 28 mm Xience and 2.5 x 28 mm Xience stents to the LAD    . Hypertension   . Hyperlipidemia   . Stroke     hx of  . Obesity   . Heart murmur   . Diabetes mellitus     type II  . Hypothyroidism   . Esophageal stricture   . Gastritis   . Nausea and vomiting   . Diarrhea   . Abdominal pain   . Esophageal reflux   . Depression   . Uterine cancer   . H/O: hysterectomy     Past Surgical History  Procedure Laterality Date  . Bladder surgery      tacking  . Cervical disc surgery    . Cholecystectomy    . Total abdominal hysterectomy      Family History  Problem Relation Age of Onset  . Heart attack Mother 41    died  . Coronary artery disease      family hx on  father side    Social History:  reports that she has never smoked. She does not have any smokeless tobacco history on file. She reports that she does not drink alcohol or use illicit drugs.  Allergies:  Allergies  Allergen Reactions  . Tetanus Toxoid     Medications: I have reviewed the patient's current medications.  Results for orders placed during the hospital encounter of 02/19/14 (from the past 48 hour(s))  CBG MONITORING, ED     Status: Abnormal   Collection Time    02/19/14  2:25 PM      Result Value Ref Range   Glucose-Capillary 183 (*) 70 - 99 mg/dL  CBC WITH DIFFERENTIAL     Status: Abnormal   Collection Time    02/19/14  3:05 PM      Result Value Ref Range   WBC 7.7  4.0 - 10.5  K/uL   RBC 3.52 (*) 3.87 - 5.11 MIL/uL   Hemoglobin 9.9 (*) 12.0 - 15.0 g/dL   HCT 32.6 (*) 36.0 - 46.0 %   MCV 92.6  78.0 - 100.0 fL   MCH 28.1  26.0 - 34.0 pg   MCHC 30.4  30.0 - 36.0 g/dL   RDW 14.9  11.5 - 15.5 %   Platelets 279  150 - 400 K/uL   Neutrophils Relative % 71  43 - 77 %   Neutro Abs 5.5  1.7 - 7.7 K/uL   Lymphocytes Relative 17  12 - 46 %   Lymphs Abs 1.3  0.7 - 4.0 K/uL   Monocytes Relative 10  3 - 12 %   Monocytes Absolute 0.8  0.1 - 1.0 K/uL   Eosinophils Relative 2  0 - 5 %   Eosinophils Absolute 0.1  0.0 - 0.7 K/uL   Basophils Relative 0  0 - 1 %   Basophils Absolute 0.0  0.0 - 0.1 K/uL  BASIC METABOLIC PANEL     Status: Abnormal   Collection Time    02/19/14  3:05 PM      Result Value Ref Range   Sodium 137  137 - 147 mEq/L   Potassium 4.8  3.7 - 5.3 mEq/L   Chloride 98  96 - 112 mEq/L   CO2 27  19 - 32 mEq/L   Glucose, Bld 175 (*) 70 - 99 mg/dL   BUN 23  6 - 23 mg/dL   Creatinine, Ser 1.20 (*) 0.50 - 1.10 mg/dL   Calcium 8.9  8.4 - 10.5 mg/dL   GFR calc non Af Amer 41 (*) >90 mL/min   GFR calc Af Amer 47 (*) >90 mL/min   Comment: (NOTE)     The eGFR has been calculated using the CKD EPI equation.     This calculation has not been validated in all clinical situations.     eGFR's persistently <90 mL/min signify possible Chronic Kidney     Disease.   Anion gap 12  5 - 15  TROPONIN I     Status: None   Collection Time    02/19/14  3:05 PM      Result Value Ref Range   Troponin I <0.30  <0.30 ng/mL   Comment:            Due to the release kinetics of cTnI,     a negative result within the first hours     of the onset of symptoms does not rule out     myocardial infarction with certainty.     If myocardial infarction is still suspected,     repeat the test at appropriate intervals.  URINALYSIS, ROUTINE W REFLEX MICROSCOPIC     Status: None   Collection Time    02/19/14  3:23 PM      Result Value Ref Range   Color, Urine YELLOW  YELLOW    APPearance CLEAR  CLEAR   Specific Gravity, Urine 1.011  1.005 - 1.030   pH 6.5  5.0 - 8.0   Glucose, UA NEGATIVE  NEGATIVE mg/dL   Hgb urine dipstick NEGATIVE  NEGATIVE   Bilirubin Urine NEGATIVE  NEGATIVE   Ketones, ur NEGATIVE  NEGATIVE mg/dL   Protein, ur NEGATIVE  NEGATIVE mg/dL   Urobilinogen, UA 0.2  0.0 - 1.0 mg/dL   Nitrite NEGATIVE  NEGATIVE   Leukocytes, UA NEGATIVE  NEGATIVE   Comment: MICROSCOPIC NOT DONE ON URINES WITH NEGATIVE PROTEIN, BLOOD, LEUKOCYTES, NITRITE, OR GLUCOSE <1000 mg/dL.    Ct Head Wo Contrast  02/19/2014   CLINICAL DATA:  Fall  EXAM: CT HEAD WITHOUT CONTRAST  CT CERVICAL SPINE WITHOUT CONTRAST  TECHNIQUE: Multidetector CT imaging of the head and cervical spine was performed following the standard protocol without intravenous contrast. Multiplanar CT image reconstructions of the cervical spine were also generated.  COMPARISON:  None.  FINDINGS: CT HEAD FINDINGS  Moderate to large subarachnoid hemorrhage. There is high-density acute hemorrhage in the basilar cisterns extending into the right sylvian fissure. There is subarachnoid hemorrhage over the convexity bilaterally, right greater than left. No intraventricular hemorrhage. No hydrocephalus.  Negative for acute infarct or mass.  Negative for skull fracture.  CT CERVICAL SPINE FINDINGS  Fracture the right first rib without significant displacement. No apical hematoma identified.  1 mm anterior slip C4-5. Extensive disc degeneration and spondylosis at C3-4 with right-sided facet degeneration. Bilateral facet degeneration at C4-5. Solid fusion at C5-6. Spondylosis at C6-7,  C7-T1, and T1-2. Diffuse facet degeneration.  Negative for cervical spine fracture.  IMPRESSION: Moderate to advanced subarachnoid hemorrhage including significant blood in the basilar cisterns. This pattern could be seen with trauma however aneurysm rupture needs to be considered based on the amount of blood in the basilar cisterns.  Nondisplaced  fracture right first rib.  Advanced cervical spondylosis.  No cervical spine fracture.  Carotid artery calcification bilaterally.  Critical Value/emergent results were called by telephone at the time of interpretation on 02/19/2014 at 4:16 PM to Dr. Daleen Bo , who verbally acknowledged these results.   Electronically Signed   By: Franchot Gallo M.D.   On: 02/19/2014 16:17   Ct Cervical Spine Wo Contrast  02/19/2014   CLINICAL DATA:  Fall  EXAM: CT HEAD WITHOUT CONTRAST  CT CERVICAL SPINE WITHOUT CONTRAST  TECHNIQUE: Multidetector CT imaging of the head and cervical spine was performed following the standard protocol without intravenous contrast. Multiplanar CT image reconstructions of the cervical spine were also generated.  COMPARISON:  None.  FINDINGS: CT HEAD FINDINGS  Moderate to large subarachnoid hemorrhage. There is high-density acute hemorrhage in the basilar cisterns extending into the right sylvian fissure. There is subarachnoid hemorrhage over the convexity bilaterally, right greater than left. No intraventricular hemorrhage. No hydrocephalus.  Negative for acute infarct or mass.  Negative for skull fracture.  CT CERVICAL SPINE FINDINGS  Fracture the right first rib without significant displacement. No apical hematoma identified.  1 mm anterior slip C4-5. Extensive disc degeneration and spondylosis at C3-4 with right-sided facet degeneration. Bilateral facet degeneration at C4-5. Solid fusion at C5-6. Spondylosis at C6-7, C7-T1, and T1-2. Diffuse facet degeneration.  Negative for cervical spine fracture.  IMPRESSION: Moderate to advanced subarachnoid hemorrhage including significant blood in the basilar cisterns. This pattern could be seen with trauma however aneurysm rupture needs to be considered based on the amount of blood in the basilar cisterns.  Nondisplaced fracture right first rib.  Advanced cervical spondylosis.  No cervical spine fracture.  Carotid artery calcification bilaterally.   Critical Value/emergent results were called by telephone at the time of interpretation on 02/19/2014 at 4:16 PM to Dr. Daleen Bo , who verbally acknowledged these results.   Electronically Signed   By: Franchot Gallo M.D.   On: 02/19/2014 16:17   Dg Chest Port 1 View  02/19/2014   CLINICAL DATA:  The patient fainted today.  EXAM: PORTABLE CHEST - 1 VIEW  COMPARISON:  November 23, 2013 the mediastinal contour is normal. The heart size is enlarged. There is bilateral diffuse increased pulmonary interstitium. Minimal bilateral pleural effusions are identified. The visualized skeletal structures are stable.  IMPRESSION: Mild congestive heart failure.   Electronically Signed   By: Abelardo Diesel M.D.   On: 02/19/2014 15:07    Review of Systems  Constitutional: Negative for weight loss.  HENT: Negative for ear discharge, ear pain, hearing loss and tinnitus.   Eyes: Negative for blurred vision, double vision, photophobia and pain.  Respiratory: Negative for cough, sputum production and shortness of breath.   Cardiovascular: Positive for chest pain.  Gastrointestinal: Negative for nausea, vomiting and abdominal pain.  Genitourinary: Negative for dysuria, urgency, frequency and flank pain.  Musculoskeletal: Positive for back pain. Negative for falls, joint pain, myalgias and neck pain.  Neurological: Positive for loss of consciousness. Negative for dizziness, tingling, sensory change, focal weakness and headaches.  Endo/Heme/Allergies: Does not bruise/bleed easily.  Psychiatric/Behavioral: Negative for depression, memory loss and substance abuse. The patient is  not nervous/anxious.    Blood pressure 206/69, pulse 59, temperature 97.4 F (36.3 C), temperature source Oral, resp. rate 12, height 5' (1.524 m), weight 147 lb (66.679 kg), SpO2 100.00%. Physical Exam  Vitals reviewed. Constitutional: She is oriented to person, place, and time. She appears well-developed and well-nourished. She is cooperative. No  distress. Cervical collar and nasal cannula in place.  HENT:  Head: Normocephalic and atraumatic. Head is without raccoon's eyes, without Battle's sign, without abrasion, without contusion and without laceration.  Right Ear: Hearing, tympanic membrane, external ear and ear canal normal. No lacerations. No drainage or tenderness. No foreign bodies. Tympanic membrane is not perforated. No hemotympanum.  Left Ear: Hearing, tympanic membrane, external ear and ear canal normal. No lacerations. No drainage or tenderness. No foreign bodies. Tympanic membrane is not perforated. No hemotympanum.  Nose: Nose normal. No nose lacerations, sinus tenderness, nasal deformity or nasal septal hematoma. No epistaxis.  Mouth/Throat: Uvula is midline, oropharynx is clear and moist and mucous membranes are normal. No lacerations. No oropharyngeal exudate.  Eyes: Conjunctivae, EOM and lids are normal. Pupils are equal, round, and reactive to light. Right eye exhibits no discharge. Left eye exhibits no discharge. No scleral icterus.  Neck: Trachea normal and normal range of motion. Neck supple. No JVD present. No spinous process tenderness and no muscular tenderness present. Carotid bruit is not present. No tracheal deviation present. No thyromegaly present.  Cardiovascular: Normal rate, regular rhythm, normal heart sounds, intact distal pulses and normal pulses.  Exam reveals no gallop and no friction rub.   No murmur heard. Respiratory: Effort normal and breath sounds normal. No stridor. No respiratory distress. She has no wheezes. She has no rales. She exhibits tenderness. She exhibits no bony tenderness, no laceration and no crepitus.  GI: Soft. Normal appearance and bowel sounds are normal. She exhibits no distension. There is no tenderness. There is no rigidity, no rebound, no guarding and no CVA tenderness.  Musculoskeletal: Normal range of motion. She exhibits no edema and no tenderness.  Lymphadenopathy:    She has  no cervical adenopathy.  Neurological: She is alert and oriented to person, place, and time. She has normal strength. No cranial nerve deficit or sensory deficit. GCS eye subscore is 4. GCS verbal subscore is 5. GCS motor subscore is 6.  Skin: Skin is warm and dry. Abrasion (Right temple) noted. She is not diaphoretic.  Psychiatric: She has a normal mood and affect. Her speech is normal and behavior is normal.    Assessment/Plan: Fall  -- Etiology is uncertain with unwitnessed fall. Mechanical vs syncopal. Leave to primary service. TBI w/bilateral diffuse SAH, R>L -- Given ASA/Plavix use will give unit of platelets. Needs neurosurgical consult. Should be admitted to neuro ICU for frequent neuro checks and repeat head CT in AM. Right first rib fx -- Seen on c-spine CT, could certainly have multiple fxs but would not suggest chest CT at this point as treatment would be the same. Aggressive pulmonary toilet. Would try to avoid narcotics if at all possible  Will order repeat head CT and CXR for am as well as TBI team therapies.    Lisette Abu, PA-C Pager: [phone] General Trauma PA Pager: [phone]   Bryahna Lesko J. 02/19/2014, 4:44 PM

## 2014-02-19 NOTE — Consult Note (Signed)
Reason for Consult:Sub arachnoid blood Referring Physician: Intensivist  Dana Blackwell is an 80 y.o. female.  HPI: 80 yo female who reportedly fell striking head. Came to ED where Ct head showed siome sub arachnoid blood. CTA done that was negative for aneurysm, abd admitted by CCM. Neurosurgical consult requested.  Past Medical History  Diagnosis Date  . Coronary artery disease     11/2012: s/p 3.0 x 28 mm Xience and 2.5 x 28 mm Xience stents to the LAD    . Hypertension   . Hyperlipidemia   . Stroke     hx of  . Obesity   . Heart murmur   . Diabetes mellitus     type II  . Hypothyroidism   . Esophageal stricture   . Gastritis   . Nausea and vomiting   . Diarrhea   . Abdominal pain   . Esophageal reflux   . Depression   . Uterine cancer   . H/O: hysterectomy     Past Surgical History  Procedure Laterality Date  . Bladder surgery      tacking  . Cervical disc surgery    . Cholecystectomy    . Total abdominal hysterectomy      Family History  Problem Relation Age of Onset  . Heart attack Mother 34    died  . Coronary artery disease      family hx on  father side    Social History:  reports that she has never smoked. She does not have any smokeless tobacco history on file. She reports that she does not drink alcohol or use illicit drugs.  Allergies:  Allergies  Allergen Reactions  . Tetanus Toxoid Other (See Comments)    unknown    Medications: I have reviewed the patient's current medications.  Results for orders placed during the hospital encounter of 02/19/14 (from the past 48 hour(s))  CBG MONITORING, ED     Status: Abnormal   Collection Time    02/19/14  2:25 PM      Result Value Ref Range   Glucose-Capillary 183 (*) 70 - 99 mg/dL  CBC WITH DIFFERENTIAL     Status: Abnormal   Collection Time    02/19/14  3:05 PM      Result Value Ref Range   WBC 7.7  4.0 - 10.5 K/uL   RBC 3.52 (*) 3.87 - 5.11 MIL/uL   Hemoglobin 9.9 (*) 12.0 - 15.0  g/dL   HCT 32.6 (*) 36.0 - 46.0 %   MCV 92.6  78.0 - 100.0 fL   MCH 28.1  26.0 - 34.0 pg   MCHC 30.4  30.0 - 36.0 g/dL   RDW 14.9  11.5 - 15.5 %   Platelets 279  150 - 400 K/uL   Neutrophils Relative % 71  43 - 77 %   Neutro Abs 5.5  1.7 - 7.7 K/uL   Lymphocytes Relative 17  12 - 46 %   Lymphs Abs 1.3  0.7 - 4.0 K/uL   Monocytes Relative 10  3 - 12 %   Monocytes Absolute 0.8  0.1 - 1.0 K/uL   Eosinophils Relative 2  0 - 5 %   Eosinophils Absolute 0.1  0.0 - 0.7 K/uL   Basophils Relative 0  0 - 1 %   Basophils Absolute 0.0  0.0 - 0.1 K/uL  BASIC METABOLIC PANEL     Status: Abnormal   Collection Time    02/19/14  3:05 PM  Result Value Ref Range   Sodium 137  137 - 147 mEq/L   Potassium 4.8  3.7 - 5.3 mEq/L   Chloride 98  96 - 112 mEq/L   CO2 27  19 - 32 mEq/L   Glucose, Bld 175 (*) 70 - 99 mg/dL   BUN 23  6 - 23 mg/dL   Creatinine, Ser 1.20 (*) 0.50 - 1.10 mg/dL   Calcium 8.9  8.4 - 10.5 mg/dL   GFR calc non Af Amer 41 (*) >90 mL/min   GFR calc Af Amer 47 (*) >90 mL/min   Comment: (NOTE)     The eGFR has been calculated using the CKD EPI equation.     This calculation has not been validated in all clinical situations.     eGFR's persistently <90 mL/min signify possible Chronic Kidney     Disease.   Anion gap 12  5 - 15  TROPONIN I     Status: None   Collection Time    02/19/14  3:05 PM      Result Value Ref Range   Troponin I <0.30  <0.30 ng/mL   Comment:            Due to the release kinetics of cTnI,     a negative result within the first hours     of the onset of symptoms does not rule out     myocardial infarction with certainty.     If myocardial infarction is still suspected,     repeat the test at appropriate intervals.  ABO/RH     Status: None   Collection Time    02/19/14  3:05 PM      Result Value Ref Range   ABO/RH(D) A POS    URINALYSIS, ROUTINE W REFLEX MICROSCOPIC     Status: None   Collection Time    02/19/14  3:23 PM      Result Value Ref  Range   Color, Urine YELLOW  YELLOW   APPearance CLEAR  CLEAR   Specific Gravity, Urine 1.011  1.005 - 1.030   pH 6.5  5.0 - 8.0   Glucose, UA NEGATIVE  NEGATIVE mg/dL   Hgb urine dipstick NEGATIVE  NEGATIVE   Bilirubin Urine NEGATIVE  NEGATIVE   Ketones, ur NEGATIVE  NEGATIVE mg/dL   Protein, ur NEGATIVE  NEGATIVE mg/dL   Urobilinogen, UA 0.2  0.0 - 1.0 mg/dL   Nitrite NEGATIVE  NEGATIVE   Leukocytes, UA NEGATIVE  NEGATIVE   Comment: MICROSCOPIC NOT DONE ON URINES WITH NEGATIVE PROTEIN, BLOOD, LEUKOCYTES, NITRITE, OR GLUCOSE <1000 mg/dL.  PREPARE PLATELET PHERESIS     Status: None   Collection Time    02/19/14  5:00 PM      Result Value Ref Range   Unit Number [id number]     Blood Component Type PLTPHER LR3     Unit division 00     Status of Unit ISSUED     Transfusion Status OK TO TRANSFUSE    GLUCOSE, CAPILLARY     Status: Abnormal   Collection Time    02/19/14  9:25 PM      Result Value Ref Range   Glucose-Capillary 171 (*) 70 - 99 mg/dL   Comment 1 Notify RN      Ct Angio Head W/cm &/or Wo Cm  02/19/2014   CLINICAL DATA:  Fall.  Intracranial hemorrhage.  EXAM: CT ANGIOGRAPHY HEAD  TECHNIQUE: Multidetector CT imaging of the head was performed using the  standard protocol during bolus administration of intravenous contrast. Multiplanar CT image reconstructions and MIPs were obtained to evaluate the vascular anatomy.  CONTRAST:  86m OMNIPAQUE IOHEXOL 350 MG/ML SOLN  COMPARISON:  CT head without contrast 02/19/2014.  FINDINGS: Dense atherosclerotic calcifications are present within the cavernous carotid arteries bilaterally. The lumen of the right internal carotid artery is narrowed to less than 1 mm. The lumen of the left internal carotid artery is narrowed to 1.2 mm. The ICA termini are intact bilaterally. The A1 and M1 segments are within normal limits. The MCA bifurcations are intact. There is no significant aneurysm within the anterior circulation. There is some attenuation of  distal ACA and MCA branch vessels without a significant proximal stenosis or occlusion.  The left vertebral artery is the dominant vessel. The PICA origins are visualized and normal bilaterally. The right vertebral artery terminates at the PICA. The left vertebral artery continues as the basilar artery with mild proximal narrowing. Both posterior cerebral arteries originate from the basilar tip. The PCA branch vessels are within normal limits bilaterally.  The dural sinuses are patent.  Cortical veins are intact.  Review of the MIP images confirms the above findings.  IMPRESSION: 1. No focal aneurysm to explain the patient's hemorrhage. 2. Dense atherosclerotic calcifications with moderate stenoses in the cavernous ICA segments bilaterally. 3. Mild small vessel disease involving the ACA and MCA branch vessels bilaterally. No significant proximal stenosis or occlusion.   Electronically Signed   By: CLawrence SantiagoM.D.   On: 02/19/2014 18:27   Ct Head Wo Contrast  02/19/2014   CLINICAL DATA:  Fall  EXAM: CT HEAD WITHOUT CONTRAST  CT CERVICAL SPINE WITHOUT CONTRAST  TECHNIQUE: Multidetector CT imaging of the head and cervical spine was performed following the standard protocol without intravenous contrast. Multiplanar CT image reconstructions of the cervical spine were also generated.  COMPARISON:  None.  FINDINGS: CT HEAD FINDINGS  Moderate to large subarachnoid hemorrhage. There is high-density acute hemorrhage in the basilar cisterns extending into the right sylvian fissure. There is subarachnoid hemorrhage over the convexity bilaterally, right greater than left. No intraventricular hemorrhage. No hydrocephalus.  Negative for acute infarct or mass.  Negative for skull fracture.  CT CERVICAL SPINE FINDINGS  Fracture the right first rib without significant displacement. No apical hematoma identified.  1 mm anterior slip C4-5. Extensive disc degeneration and spondylosis at C3-4 with right-sided facet degeneration.  Bilateral facet degeneration at C4-5. Solid fusion at C5-6. Spondylosis at C6-7, C7-T1, and T1-2. Diffuse facet degeneration.  Negative for cervical spine fracture.  IMPRESSION: Moderate to advanced subarachnoid hemorrhage including significant blood in the basilar cisterns. This pattern could be seen with trauma however aneurysm rupture needs to be considered based on the amount of blood in the basilar cisterns.  Nondisplaced fracture right first rib.  Advanced cervical spondylosis.  No cervical spine fracture.  Carotid artery calcification bilaterally.  Critical Value/emergent results were called by telephone at the time of interpretation on 02/19/2014 at 4:16 PM to Dr. EDaleen Bo, who verbally acknowledged these results.   Electronically Signed   By: CFranchot GalloM.D.   On: 02/19/2014 16:17   Ct Cervical Spine Wo Contrast  02/19/2014   CLINICAL DATA:  Fall  EXAM: CT HEAD WITHOUT CONTRAST  CT CERVICAL SPINE WITHOUT CONTRAST  TECHNIQUE: Multidetector CT imaging of the head and cervical spine was performed following the standard protocol without intravenous contrast. Multiplanar CT image reconstructions of the cervical spine were also generated.  COMPARISON:  None.  FINDINGS: CT HEAD FINDINGS  Moderate to large subarachnoid hemorrhage. There is high-density acute hemorrhage in the basilar cisterns extending into the right sylvian fissure. There is subarachnoid hemorrhage over the convexity bilaterally, right greater than left. No intraventricular hemorrhage. No hydrocephalus.  Negative for acute infarct or mass.  Negative for skull fracture.  CT CERVICAL SPINE FINDINGS  Fracture the right first rib without significant displacement. No apical hematoma identified.  1 mm anterior slip C4-5. Extensive disc degeneration and spondylosis at C3-4 with right-sided facet degeneration. Bilateral facet degeneration at C4-5. Solid fusion at C5-6. Spondylosis at C6-7, C7-T1, and T1-2. Diffuse facet degeneration.  Negative  for cervical spine fracture.  IMPRESSION: Moderate to advanced subarachnoid hemorrhage including significant blood in the basilar cisterns. This pattern could be seen with trauma however aneurysm rupture needs to be considered based on the amount of blood in the basilar cisterns.  Nondisplaced fracture right first rib.  Advanced cervical spondylosis.  No cervical spine fracture.  Carotid artery calcification bilaterally.  Critical Value/emergent results were called by telephone at the time of interpretation on 02/19/2014 at 4:16 PM to Dr. Daleen Bo , who verbally acknowledged these results.   Electronically Signed   By: Franchot Gallo M.D.   On: 02/19/2014 16:17   Dg Chest Port 1 View  02/19/2014   CLINICAL DATA:  The patient fainted today.  EXAM: PORTABLE CHEST - 1 VIEW  COMPARISON:  November 23, 2013 the mediastinal contour is normal. The heart size is enlarged. There is bilateral diffuse increased pulmonary interstitium. Minimal bilateral pleural effusions are identified. The visualized skeletal structures are stable.  IMPRESSION: Mild congestive heart failure.   Electronically Signed   By: Abelardo Diesel M.D.   On: 02/19/2014 15:07    Review of systems not obtained due to patient factors. Blood pressure 158/65, pulse 64, temperature 97.8 F (36.6 C), temperature source Oral, resp. rate 17, height 5' (1.524 m), weight 66.679 kg (147 lb), SpO2 96.00%. Awake, alert. Follows complex commands. Verbal fluent and appropriate.   Assessment/Plan: Impression is subarachnoid hemorrhage after fall with GCS 15. Repeat CT tomorrow am, and hopefully home soon.  Dana Ghee, MD 02/19/2014, 10:00 PM

## 2014-02-19 NOTE — ED Notes (Signed)
Pt signed consent form

## 2014-02-19 NOTE — ED Provider Notes (Signed)
CSN: [id number]     Arrival date & time 02/19/14  1419 History   First MD Initiated Contact with Patient 02/19/14 1448     Chief Complaint  Patient presents with  . Fall     (Consider location/radiation/quality/duration/timing/severity/associated sxs/prior Treatment) HPI  Patient is here for evaluation of fall. She reportedly fell while taking garbage to a dumpster. A neighbor found her and called EMS. She was transferred by EMS for evaluation. The patient does not recall what happened. She injured her head in the fall and has had chest pain since then. She does not recall any other problems, today. She denies recent fever, chills, cough, nausea, or vomiting. She is a moderately poor historian. There are no other known modifying factors.  Past Medical History  Diagnosis Date  . Coronary artery disease     11/2012: s/p 3.0 x 28 mm Xience and 2.5 x 28 mm Xience stents to the LAD    . Hypertension   . Hyperlipidemia   . Stroke     hx of  . Obesity   . Heart murmur   . Diabetes mellitus     type II  . Hypothyroidism   . Esophageal stricture   . Gastritis   . Nausea and vomiting   . Diarrhea   . Abdominal pain   . Esophageal reflux   . Depression   . Uterine cancer   . H/O: hysterectomy    Past Surgical History  Procedure Laterality Date  . Bladder surgery      tacking  . Cervical disc surgery    . Cholecystectomy    . Total abdominal hysterectomy     Family History  Problem Relation Age of Onset  . Heart attack Mother 13    died  . Coronary artery disease      family hx on  father side   History  Substance Use Topics  . Smoking status: Never Smoker   . Smokeless tobacco: Not on file  . Alcohol Use: No   OB History   Grav Para Term Preterm Abortions TAB SAB Ect Mult Living                 Review of Systems  All other systems reviewed and are negative.     Allergies  Tetanus toxoid  Home Medications   Prior to Admission medications   Medication Sig  Start Date End Date Taking? Authorizing Provider  acetaminophen (TYLENOL) 650 MG CR tablet Take 650 mg by mouth daily.     Historical Provider, MD  aspirin 81 MG EC tablet Take 1 tablet (81 mg total) by mouth daily. 11/28/13   Rhonda G Barrett, PA-C  atorvastatin (LIPITOR) 20 MG tablet Take 20 mg by mouth daily.    Historical Provider, MD  beta carotene w/minerals (OCUVITE) tablet Take 1 tablet by mouth daily.     Historical Provider, MD  calcium carbonate (OS-CAL) 600 MG TABS tablet Take 600 mg by mouth daily.    Historical Provider, MD  clopidogrel (PLAVIX) 75 MG tablet Take 1 tablet (75 mg total) by mouth daily with breakfast. 11/28/13   Evelene Croon Barrett, PA-C  Cyanocobalamin (VITAMIN B-12 PO) Take 1 tablet by mouth daily.    Historical Provider, MD  fish oil-omega-3 fatty acids 1000 MG capsule Take 1 g by mouth daily.     Historical Provider, MD  folic acid-pyridoxine-cyancobalamin (FOLTX) 2.5-25-2 MG TABS Take 1 tablet by mouth daily.     Historical Provider, MD  furosemide (LASIX) 20 MG tablet Take 20 mg by mouth daily.    Historical Provider, MD  hydrocodone-acetaminophen (LORCET-HD) 5-500 MG per capsule Take 1 capsule by mouth daily.     Historical Provider, MD  insulin glargine (LANTUS) 100 UNIT/ML injection Inject 0.1 mLs (10 Units total) into the skin at bedtime. 11/28/13   Evelene Croon Barrett, PA-C  isosorbide dinitrate (ISORDIL) 30 MG tablet  01/15/14   Historical Provider, MD  isosorbide mononitrate (IMDUR) 30 MG 24 hr tablet Take 1 tablet (30 mg total) by mouth daily. 11/28/13   Rhonda G Barrett, PA-C  levothyroxine (SYNTHROID, LEVOTHROID) 75 MCG tablet Take 75 mcg by mouth daily.      Historical Provider, MD  lisinopril (PRINIVIL,ZESTRIL) 5 MG tablet Take 1 tablet (5 mg total) by mouth daily. 11/28/13   Rhonda G Barrett, PA-C  MAGNESIUM PO Take 1 tablet by mouth daily.    Historical Provider, MD  metoprolol (TOPROL-XL) 100 MG 24 hr tablet Take 100 mg by mouth daily.     Historical Provider,  MD  mometasone (ELOCON) 0.1 % ointment  12/17/13   Historical Provider, MD  nitrofurantoin (MACRODANTIN) 100 MG capsule Take 100 mg by mouth daily.    Historical Provider, MD  pantoprazole (PROTONIX) 40 MG tablet Take 40 mg by mouth daily.      Historical Provider, MD  predniSONE (DELTASONE) 5 MG tablet Take 5 mg by mouth daily with breakfast.    Historical Provider, MD  sertraline (ZOLOFT) 100 MG tablet Take 50 mg by mouth daily.     Historical Provider, MD  trimethoprim (TRIMPEX) 100 MG tablet  12/17/13   Historical Provider, MD   BP 206/69  Pulse 61  Temp(Src) 97.4 F (36.3 C) (Oral)  Resp 15  Ht 5' (1.524 m)  Wt 147 lb (66.679 kg)  BMI 28.71 kg/m2  SpO2 100% Physical Exam  Nursing note and vitals reviewed. Constitutional: She appears well-developed.  Elderly, frail  HENT:  Head: Normocephalic.  Abrasion right forehead, and right frontal scalp, no apparent scalp laceration. No scalp crepitation, swelling or deformity  Eyes: Conjunctivae and EOM are normal. Pupils are equal, round, and reactive to light.  Neck: Normal range of motion and phonation normal. Neck supple.  Cardiovascular: Normal rate, regular rhythm and intact distal pulses.   Pulmonary/Chest: Effort normal and breath sounds normal. She exhibits no tenderness.  Abdominal: Soft. She exhibits no distension. There is no tenderness. There is no guarding.  Musculoskeletal: Normal range of motion.  No tenderness of the cervical, thoracic or lumbar spine.  Neurological: She is alert. No cranial nerve deficit. She exhibits normal muscle tone. Coordination normal.  Skin: Skin is warm and dry.  Psychiatric: She has a normal mood and affect. Her behavior is normal.    ED Course  Procedures (including critical care time) Medications  0.9 %  sodium chloride infusion ( Intravenous New Bag/Given 02/19/14 1622)    Patient Vitals for the past 24 hrs:  BP Temp Temp src Pulse Resp SpO2 Height Weight  02/19/14 1620 206/69 mmHg - -  61 15 100 % - -  02/19/14 1428 193/75 mmHg - - 58 13 98 % - -  02/19/14 1426 - 97.4 F (36.3 C) Oral 61 18 99 % 5' (1.524 m) 147 lb (66.679 kg)  02/19/14 1419 - - - - - 96 % - -   16:15- case discussed with on-call radiologist, Dr. Carlis Abbott; he, states that the patient has a basilar subarachnoid hemorrhage, and a first  rib fracture. The intracranial bleeding, could be either traumatic or ruptured aneurysm.   16:20- case discussed with on-call trauma surgeon, Dr. Hulen Skains, will see the patient. He believes that she will require admission to a medical service  16:35- Discussed with Neurosurgery Chief Technology Officer). He recommended a CTA Head to evaluate for aneurysm. He rec. Do not lower BP at this time. He will see pt after CTA.  4:24 PM Reevaluation with update and discussion. After initial assessment and treatment, an updated evaluation reveals she remains alert, conversant, and cooperative. GCS is 15. [place] Review Labs Reviewed  CBC WITH DIFFERENTIAL - Abnormal; Notable for the following:    RBC 3.52 (*)    Hemoglobin 9.9 (*)    HCT 32.6 (*)    All other components within normal limits  BASIC METABOLIC PANEL - Abnormal; Notable for the following:    Glucose, Bld 175 (*)    Creatinine, Ser 1.20 (*)    GFR calc non Af Amer 41 (*)    GFR calc Af Amer 47 (*)    All other components within normal limits  CBG MONITORING, ED - Abnormal; Notable for the following:    Glucose-Capillary 183 (*)    All other components within normal limits  URINE CULTURE  TROPONIN I  URINALYSIS, ROUTINE W REFLEX MICROSCOPIC    Imaging Review Ct Head Wo Contrast  02/19/2014   CLINICAL DATA:  Fall  EXAM: CT HEAD WITHOUT CONTRAST  CT CERVICAL SPINE WITHOUT CONTRAST  TECHNIQUE: Multidetector CT imaging of the head and cervical spine was performed following the standard protocol without intravenous contrast. Multiplanar CT image reconstructions of the cervical spine were also generated.  COMPARISON:  None.   FINDINGS: CT HEAD FINDINGS  Moderate to large subarachnoid hemorrhage. There is high-density acute hemorrhage in the basilar cisterns extending into the right sylvian fissure. There is subarachnoid hemorrhage over the convexity bilaterally, right greater than left. No intraventricular hemorrhage. No hydrocephalus.  Negative for acute infarct or mass.  Negative for skull fracture.  CT CERVICAL SPINE FINDINGS  Fracture the right first rib without significant displacement. No apical hematoma identified.  1 mm anterior slip C4-5. Extensive disc degeneration and spondylosis at C3-4 with right-sided facet degeneration. Bilateral facet degeneration at C4-5. Solid fusion at C5-6. Spondylosis at C6-7, C7-T1, and T1-2. Diffuse facet degeneration.  Negative for cervical spine fracture.  IMPRESSION: Moderate to advanced subarachnoid hemorrhage including significant blood in the basilar cisterns. This pattern could be seen with trauma however aneurysm rupture needs to be considered based on the amount of blood in the basilar cisterns.  Nondisplaced fracture right first rib.  Advanced cervical spondylosis.  No cervical spine fracture.  Carotid artery calcification bilaterally.  Critical Value/emergent results were called by telephone at the time of interpretation on 02/19/2014 at 4:16 PM to Dr. Daleen Bo , who verbally acknowledged these results.   Electronically Signed   By: Franchot Gallo M.D.   On: 02/19/2014 16:17   Ct Cervical Spine Wo Contrast  02/19/2014   CLINICAL DATA:  Fall  EXAM: CT HEAD WITHOUT CONTRAST  CT CERVICAL SPINE WITHOUT CONTRAST  TECHNIQUE: Multidetector CT imaging of the head and cervical spine was performed following the standard protocol without intravenous contrast. Multiplanar CT image reconstructions of the cervical spine were also generated.  COMPARISON:  None.  FINDINGS: CT HEAD FINDINGS  Moderate to large subarachnoid hemorrhage. There is high-density acute hemorrhage in the basilar cisterns  extending into the right sylvian fissure. There is  subarachnoid hemorrhage over the convexity bilaterally, right greater than left. No intraventricular hemorrhage. No hydrocephalus.  Negative for acute infarct or mass.  Negative for skull fracture.  CT CERVICAL SPINE FINDINGS  Fracture the right first rib without significant displacement. No apical hematoma identified.  1 mm anterior slip C4-5. Extensive disc degeneration and spondylosis at C3-4 with right-sided facet degeneration. Bilateral facet degeneration at C4-5. Solid fusion at C5-6. Spondylosis at C6-7, C7-T1, and T1-2. Diffuse facet degeneration.  Negative for cervical spine fracture.  IMPRESSION: Moderate to advanced subarachnoid hemorrhage including significant blood in the basilar cisterns. This pattern could be seen with trauma however aneurysm rupture needs to be considered based on the amount of blood in the basilar cisterns.  Nondisplaced fracture right first rib.  Advanced cervical spondylosis.  No cervical spine fracture.  Carotid artery calcification bilaterally.  Critical Value/emergent results were called by telephone at the time of interpretation on 02/19/2014 at 4:16 PM to Dr. Daleen Bo , who verbally acknowledged these results.   Electronically Signed   By: Franchot Gallo M.D.   On: 02/19/2014 16:17   Dg Chest Port 1 View  02/19/2014   CLINICAL DATA:  The patient fainted today.  EXAM: PORTABLE CHEST - 1 VIEW  COMPARISON:  November 23, 2013 the mediastinal contour is normal. The heart size is enlarged. There is bilateral diffuse increased pulmonary interstitium. Minimal bilateral pleural effusions are identified. The visualized skeletal structures are stable.  IMPRESSION: Mild congestive heart failure.   Electronically Signed   By: Abelardo Diesel M.D.   On: 02/19/2014 15:07     EKG Interpretation   Date/Time:  Thursday February 19 2014 14:25:37 EDT Ventricular Rate:  61 PR Interval:  158 QRS Duration: 98 QT Interval:  472 QTC  Calculation: 475 R Axis:   -5 Text Interpretation:  Sinus rhythm Probable left ventricular hypertrophy  since last tracing no significant change Confirmed by Eulis Foster  MD, Vira Agar  ([id number]) on 02/19/2014 3:55:17 PM      MDM   Final diagnoses:  SAH (subarachnoid hemorrhage)  Closed rib fracture, left, initial encounter  Syncope, unspecified syncope type    Syncope with fall. Subsequent SAH, Nonspecific CP with 1st rib fracture. No resp. Distress. GCS is normal.   Nursing Notes Reviewed/ Care Coordinated Applicable Imaging Reviewed Interpretation of Laboratory Data incorporated into ED treatment   Plan: Admit    Richarda Blade, MD 02/19/14 2017

## 2014-02-19 NOTE — ED Notes (Signed)
Dr. Eulis Foster notified of patients BP.

## 2014-02-19 NOTE — ED Notes (Signed)
Pt now states she remembers she forgot to turn on oxygen while going outside.

## 2014-02-19 NOTE — ED Notes (Signed)
C collar removed by Dr. Eulis Foster.

## 2014-02-19 NOTE — ED Notes (Signed)
Per [facility], pt was walking out to the garbage at home, had a fall witnessed by neighbor, unsure of cause. EMS found patient in Left lateral recumbent position. Pt is hard of hearing. AAOX4. C collar, headblocks and LSB. Pt c/o intermittent CP. Given 2 nitro and 324 asa with some relief.

## 2014-02-19 NOTE — ED Notes (Signed)
Dana Blackwell [phone]

## 2014-02-19 NOTE — ED Notes (Signed)
Possible patient contact [phone] [facility]

## 2014-02-19 NOTE — H&P (Addendum)
PULMONARY / CRITICAL CARE MEDICINE   Name: Dana Blackwell MRN: [id number] DOB: 02/07/1928    ADMISSION DATE:  02/19/2014 CONSULTATION DATE:  02/19/2014  REFERRING MD :  EDP PRIMARY SERVICE: PCCM  CHIEF COMPLAINT:  SAH S/P FALL  BRIEF PATIENT DESCRIPTION: 80 yo female with CAD, HTN, CVA, DM, COPD presents to [facility] ED 7/2 s/p mechanical fall. In ED found to have acute SAH. PCCM asked to evaluate for ICU admission.   SIGNIFICANT EVENTS / STUDIES:  7/2 - CT head/c spine > Subarachnoid hemorrhage including significant blood in the basilar cisterns. Trauma vs aneurysm. fracture R 1st rib. 7/2 - CTA head > No focal aneurysm to explain the patient's hemorrhage. Chronic small vessel disease changes.    LINES / TUBES: PIV  CULTURES: None  ANTIBIOTICS: None  HISTORY OF PRESENT ILLNESS:  80 year old female with PMH as below, which includes CAD, CVA, DM, and hypothyroid. Recent admission to [facility] for ACS during which she received cardiac cath with stents to 2 vessels. She was started on double anti platelet therapy. She was sent home on home O2. She has been going to cardiac rehab and had been improving. 02/19/2014 She was headed out of her house, and turned around to turn her O2 on. She then suffered a fall that was witnessed by a neighbor. Neighbor called EMS. Patient does not have a great memory of the fall. In ED found to be hypertensive, CT head revealed SAH.  PAST MEDICAL HISTORY :  Past Medical History  Diagnosis Date  . Coronary artery disease     11/2012: s/p 3.0 x 28 mm Xience and 2.5 x 28 mm Xience stents to the LAD    . Hypertension   . Hyperlipidemia   . Stroke     hx of  . Obesity   . Heart murmur   . Diabetes mellitus     type II  . Hypothyroidism   . Esophageal stricture   . Gastritis   . Nausea and vomiting   . Diarrhea   . Abdominal pain   . Esophageal reflux   . Depression   . Uterine cancer   . H/O: hysterectomy    Past Surgical History  Procedure  Laterality Date  . Bladder surgery      tacking  . Cervical disc surgery    . Cholecystectomy    . Total abdominal hysterectomy     Prior to Admission medications   Medication Sig Start Date End Date Taking? Authorizing Provider  acetaminophen (TYLENOL) 650 MG CR tablet Take 650 mg by mouth daily.    Yes Historical Provider, MD  aspirin 81 MG EC tablet Take 1 tablet (81 mg total) by mouth daily. 11/28/13  Yes Rhonda G Barrett, PA-C  atorvastatin (LIPITOR) 20 MG tablet Take 20 mg by mouth daily at 6 PM.    Yes Historical Provider, MD  beta carotene w/minerals (OCUVITE) tablet Take 1 tablet by mouth at bedtime.    Yes Historical Provider, MD  calcium carbonate (OS-CAL) 600 MG TABS tablet Take 600 mg by mouth daily.   Yes Historical Provider, MD  clopidogrel (PLAVIX) 75 MG tablet Take 1 tablet (75 mg total) by mouth daily with breakfast. 11/28/13  Yes Rhonda G Barrett, PA-C  Cyanocobalamin (VITAMIN B-12 PO) Take 1 tablet by mouth daily.   Yes Historical Provider, MD  fish oil-omega-3 fatty acids 1000 MG capsule Take 1 g by mouth daily.    Yes Historical Provider, MD  folic acid-pyridoxine-cyancobalamin (FOLTX)  2.5-25-2 MG TABS Take 1 tablet by mouth daily.    Yes Historical Provider, MD  furosemide (LASIX) 20 MG tablet Take 20 mg by mouth daily.   Yes Historical Provider, MD  hydrocodone-acetaminophen (LORCET-HD) 5-500 MG per capsule Take 1 capsule by mouth daily.    Yes Historical Provider, MD  insulin glargine (LANTUS) 100 UNIT/ML injection Inject 0.1 mLs (10 Units total) into the skin at bedtime. 11/28/13  Yes Rhonda G Barrett, PA-C  insulin lispro (HUMALOG) 100 UNIT/ML injection Inject 4-5 Units into the skin 3 (three) times daily before meals.   Yes Historical Provider, MD  isosorbide mononitrate (IMDUR) 30 MG 24 hr tablet Take 1 tablet (30 mg total) by mouth daily. 11/28/13  Yes Rhonda G Barrett, PA-C  levothyroxine (SYNTHROID, LEVOTHROID) 75 MCG tablet Take 75 mcg by mouth daily.     Yes  Historical Provider, MD  lisinopril (PRINIVIL,ZESTRIL) 5 MG tablet Take 1 tablet (5 mg total) by mouth daily. 11/28/13  Yes Rhonda G Barrett, PA-C  MAGNESIUM PO Take 1 tablet by mouth at bedtime.    Yes Historical Provider, MD  metoprolol (TOPROL-XL) 100 MG 24 hr tablet Take 100 mg by mouth every morning.    Yes Historical Provider, MD  pantoprazole (PROTONIX) 40 MG tablet Take 40 mg by mouth daily.     Yes Historical Provider, MD  predniSONE (DELTASONE) 5 MG tablet Take 5 mg by mouth daily with breakfast.   Yes Historical Provider, MD  sertraline (ZOLOFT) 100 MG tablet Take 50 mg by mouth daily.    Yes Historical Provider, MD   Allergies  Allergen Reactions  . Tetanus Toxoid Other (See Comments)    unknown    FAMILY HISTORY:  Family History  Problem Relation Age of Onset  . Heart attack Mother 70    died  . Coronary artery disease      family hx on  father side   SOCIAL HISTORY:  reports that she has never smoked. She does not have any smokeless tobacco history on file. She reports that she does not drink alcohol or use illicit drugs.  REVIEW OF SYSTEMS:   Bolds are positive  Constitutional: weight loss, gain, night sweats, Fevers, chills, fatigue .  HEENT: headaches, Sore throat, sneezing, nasal congestion, post nasal drip, Difficulty swallowing, Tooth/dental problems, visual complaints visual changes, ear ache CV:  chest pain, radiates: ,Orthopnea, PND, swelling in lower extremities, dizziness, palpitations, syncope.  GI  heartburn, indigestion, abdominal pain, nausea, vomiting, diarrhea, change in bowel habits, loss of appetite, bloody stools.  Resp: cough, productive: , hemoptysis, dyspnea, chest pain, pleuritic.  Skin: rash or itching or icterus GU: dysuria, change in color of urine, urgency or frequency. flank pain, hematuria  MS: joint pain or swelling. decreased range of motion  Psych: change in mood or affect. depression or anxiety.  Neuro: difficulty with speech,  weakness, numbness, ataxia    SUBJECTIVE:   VITAL SIGNS: Temp:  [97.4 F (36.3 C)-97.8 F (36.6 C)] 97.8 F (36.6 C) (07/02 1857) Pulse Rate:  [58-70] 64 (07/02 1900) Resp:  [12-19] 17 (07/02 1900) BP: (183-206)/(61-83) 193/69 mmHg (07/02 1900) SpO2:  [94 %-100 %] 99 % (07/02 1900) Weight:  [66.679 kg (147 lb)] 66.679 kg (147 lb) (07/02 1426) HEMODYNAMICS:   VENTILATOR SETTINGS:   INTAKE / OUTPUT: Intake/Output     07/02 0701 - 07/03 0700   Blood 211   Total Intake(mL/kg) 211 (3.2)   Net +211         PHYSICAL  EXAMINATION: General:  Elderly female in NAD on nasal cannula  Neuro:  Alert, orietned x 4. Follows commands. No focal deficits.  HEENT:  [place], small, abrasion to R frontal/temporal area.  Cardiovascular:  RRR Lungs:  Clear anterior Abdomen:  Soft, non-tender, non -distended Musculoskeletal:  No acute deformity.  Skin:  Abrasion as mentioned above otherwise intact.   LABS:  CBC  Recent Labs Lab 02/19/14 1505  WBC 7.7  HGB 9.9*  HCT 32.6*  PLT 279   Coag's No results found for this basename: APTT, INR,  in the last 168 hours BMET  Recent Labs Lab 02/19/14 1505  NA 137  K 4.8  CL 98  CO2 27  BUN 23  CREATININE 1.20*  GLUCOSE 175*   Electrolytes  Recent Labs Lab 02/19/14 1505  CALCIUM 8.9   Sepsis Markers No results found for this basename: LATICACIDVEN, PROCALCITON, O2SATVEN,  in the last 168 hours ABG No results found for this basename: PHART, PCO2ART, PO2ART,  in the last 168 hours Liver Enzymes No results found for this basename: AST, ALT, ALKPHOS, BILITOT, ALBUMIN,  in the last 168 hours Cardiac Enzymes  Recent Labs Lab 02/19/14 1505  TROPONINI <0.30   Glucose  Recent Labs Lab 02/19/14 1425  GLUCAP 183*    Imaging Ct Angio Head W/cm &/or Wo Cm  02/19/2014   CLINICAL DATA:  Fall.  Intracranial hemorrhage.  EXAM: CT ANGIOGRAPHY HEAD  TECHNIQUE: Multidetector CT imaging of the head was performed using the standard  protocol during bolus administration of intravenous contrast. Multiplanar CT image reconstructions and MIPs were obtained to evaluate the vascular anatomy.  CONTRAST:  58mL OMNIPAQUE IOHEXOL 350 MG/ML SOLN  COMPARISON:  CT head without contrast 02/19/2014.  FINDINGS: Dense atherosclerotic calcifications are present within the cavernous carotid arteries bilaterally. The lumen of the right internal carotid artery is narrowed to less than 1 mm. The lumen of the left internal carotid artery is narrowed to 1.2 mm. The ICA termini are intact bilaterally. The A1 and M1 segments are within normal limits. The MCA bifurcations are intact. There is no significant aneurysm within the anterior circulation. There is some attenuation of distal ACA and MCA branch vessels without a significant proximal stenosis or occlusion.  The left vertebral artery is the dominant vessel. The PICA origins are visualized and normal bilaterally. The right vertebral artery terminates at the PICA. The left vertebral artery continues as the basilar artery with mild proximal narrowing. Both posterior cerebral arteries originate from the basilar tip. The PCA branch vessels are within normal limits bilaterally.  The dural sinuses are patent.  Cortical veins are intact.  Review of the MIP images confirms the above findings.  IMPRESSION: 1. No focal aneurysm to explain the patient's hemorrhage. 2. Dense atherosclerotic calcifications with moderate stenoses in the cavernous ICA segments bilaterally. 3. Mild small vessel disease involving the ACA and MCA branch vessels bilaterally. No significant proximal stenosis or occlusion.   Electronically Signed   By: Lawrence Santiago M.D.   On: 02/19/2014 18:27   Ct Head Wo Contrast  02/19/2014   CLINICAL DATA:  Fall  EXAM: CT HEAD WITHOUT CONTRAST  CT CERVICAL SPINE WITHOUT CONTRAST  TECHNIQUE: Multidetector CT imaging of the head and cervical spine was performed following the standard protocol without intravenous  contrast. Multiplanar CT image reconstructions of the cervical spine were also generated.  COMPARISON:  None.  FINDINGS: CT HEAD FINDINGS  Moderate to large subarachnoid hemorrhage. There is high-density acute hemorrhage in the basilar  cisterns extending into the right sylvian fissure. There is subarachnoid hemorrhage over the convexity bilaterally, right greater than left. No intraventricular hemorrhage. No hydrocephalus.  Negative for acute infarct or mass.  Negative for skull fracture.  CT CERVICAL SPINE FINDINGS  Fracture the right first rib without significant displacement. No apical hematoma identified.  1 mm anterior slip C4-5. Extensive disc degeneration and spondylosis at C3-4 with right-sided facet degeneration. Bilateral facet degeneration at C4-5. Solid fusion at C5-6. Spondylosis at C6-7, C7-T1, and T1-2. Diffuse facet degeneration.  Negative for cervical spine fracture.  IMPRESSION: Moderate to advanced subarachnoid hemorrhage including significant blood in the basilar cisterns. This pattern could be seen with trauma however aneurysm rupture needs to be considered based on the amount of blood in the basilar cisterns.  Nondisplaced fracture right first rib.  Advanced cervical spondylosis.  No cervical spine fracture.  Carotid artery calcification bilaterally.  Critical Value/emergent results were called by telephone at the time of interpretation on 02/19/2014 at 4:16 PM to Dr. Daleen Bo , who verbally acknowledged these results.   Electronically Signed   By: Franchot Gallo M.D.   On: 02/19/2014 16:17   Ct Cervical Spine Wo Contrast  02/19/2014   CLINICAL DATA:  Fall  EXAM: CT HEAD WITHOUT CONTRAST  CT CERVICAL SPINE WITHOUT CONTRAST  TECHNIQUE: Multidetector CT imaging of the head and cervical spine was performed following the standard protocol without intravenous contrast. Multiplanar CT image reconstructions of the cervical spine were also generated.  COMPARISON:  None.  FINDINGS: CT HEAD FINDINGS   Moderate to large subarachnoid hemorrhage. There is high-density acute hemorrhage in the basilar cisterns extending into the right sylvian fissure. There is subarachnoid hemorrhage over the convexity bilaterally, right greater than left. No intraventricular hemorrhage. No hydrocephalus.  Negative for acute infarct or mass.  Negative for skull fracture.  CT CERVICAL SPINE FINDINGS  Fracture the right first rib without significant displacement. No apical hematoma identified.  1 mm anterior slip C4-5. Extensive disc degeneration and spondylosis at C3-4 with right-sided facet degeneration. Bilateral facet degeneration at C4-5. Solid fusion at C5-6. Spondylosis at C6-7, C7-T1, and T1-2. Diffuse facet degeneration.  Negative for cervical spine fracture.  IMPRESSION: Moderate to advanced subarachnoid hemorrhage including significant blood in the basilar cisterns. This pattern could be seen with trauma however aneurysm rupture needs to be considered based on the amount of blood in the basilar cisterns.  Nondisplaced fracture right first rib.  Advanced cervical spondylosis.  No cervical spine fracture.  Carotid artery calcification bilaterally.  Critical Value/emergent results were called by telephone at the time of interpretation on 02/19/2014 at 4:16 PM to Dr. Daleen Bo , who verbally acknowledged these results.   Electronically Signed   By: Franchot Gallo M.D.   On: 02/19/2014 16:17   Dg Chest Port 1 View  02/19/2014   CLINICAL DATA:  The patient fainted today.  EXAM: PORTABLE CHEST - 1 VIEW  COMPARISON:  November 23, 2013 the mediastinal contour is normal. The heart size is enlarged. There is bilateral diffuse increased pulmonary interstitium. Minimal bilateral pleural effusions are identified. The visualized skeletal structures are stable.  IMPRESSION: Mild congestive heart failure.   Electronically Signed   By: Abelardo Diesel M.D.   On: 02/19/2014 15:07    ASSESSMENT / PLAN:  NEUROLOGIC A:   Syncope - unclear  etiology Traumatic SAH, small - no idication for surgical intervention 7/2.  H/o CVA H/O depression Head, chest pain, traumatic P:   Consult neurology Neurochecks per  ICU protocol BP mgmt Continue sertraline PRN hydrocodone Will need to consider syncope W/U  PULMONARY A: Chronic respiratory failure  Etiology not fully elucidated. ? CHF/COPD.  R first rib fracture, nondisplaced - traumatic P:   Supplemental O2 as needed to maintain SpO2 > 92% F/u CXR in am PRN BD  CARDIOVASCULAR A:  Hypertension H/o CAD, HTN  P:  SBP goal 120-160 per NS recs Continue nicardipine gtt to acheive SBP goal Continue home lisinopril, metoprolol, imdur Holding furosemide Continue Statin Will need to notify Cards in AM 7/03 Burt Knack is primary cardiologist)  RENAL A:   Mild CKD P:   Monitor BMET intermittently Monitor I/Os Correct electrolytes as indicated  GASTROINTESTINAL A:   GERD P:   Continue home PPI CHO mod diet  HEMATOLOGIC A:   Mild chronic anemia Mild bleeding sequelae after fall P:  Holding ASA, plavix Follow CBC, Coags SCDs  INFECTIOUS A:   No acute issues  P:   Monitor clinically  ENDOCRINE A:   DM Hypothyroid P:   CBG and SSI Continue home lantus 10 units Cont Synthroid Check cortisol   Georgann Housekeeper, ACNP [place] [facility] Pager [phone] or [phone]   PCCM ATTENDING I have personally obtained a history, examined the patient, evaluated laboratory and imaging results, formulated the assessment and plan and placed orders. Discussed with Dr Jeanie Sewer, MD;  PCCM service; Mobile [phone]

## 2014-02-19 NOTE — Consult Note (Signed)
The patient is stable with a GCS of 15.  She is very hard of hearing.  She is chronically anemic and although pale, does not appear to be in hemorrhagic shock.  Minimal cerebral symptoms.  The neurosurgeon on call has ordered a CTA of the head to rule out a cerebral aneurysm as the cause of her intracranial SAH.  She is getting platelets to counteract her plavix.  This patient has been seen and I agree with the findings and treatment plan.  Kathryne Eriksson. Dahlia Bailiff, MD, [place] [phone] (pager) [phone] (direct pager) Trauma Surgeon

## 2014-02-19 NOTE — ED Provider Notes (Addendum)
Pt was seen initially by Dr. Eulis Foster.  Patient presented with syncope and ground level fall. It's unclear whether the syncope or the fall happened first. CT scan of her head revealed a large subarachnoid hemorrhage. Trauma service evaluated the patient and her consult note but it was not clear that they wanted the patient to the medicine service. On reconsult of the trauma service,  he indicated that they wanted the patient admitted to medicine service. Given that she is going to go to the neuro ICU for her subarachnoid hemorrhage, eye consult to critical care medicine who is coming to evaluate the patient. Neurosurgery is also been consulted but they have not yet seen the patient. CTA of her head did not reveal a aneurysm.  Pts SBP running between 190-200, discussed with neurosurgery who recommends starting labetolol or nicardipine to keep SBP under 160.  Given bradycardia, will start nicardipine.  CRITICAL CARE Performed by: Alasia Enge Total critical care time: 45 Critical care time was exclusive of separately billable procedures and treating other patients. Critical care was necessary to treat or prevent imminent or life-threatening deterioration. Critical care was time spent personally by me on the following activities: development of treatment plan with patient and/or surrogate as well as nursing, discussions with consultants, evaluation of patient's response to treatment, examination of patient, obtaining history from patient or surrogate, ordering and performing treatments and interventions, ordering and review of laboratory studies, ordering and review of radiographic studies, pulse oximetry and re-evaluation of patient's condition.   Malvin Johns, MD 02/19/14 1945  Malvin Johns, MD 02/19/14 1958

## 2014-02-20 ENCOUNTER — Inpatient Hospital Stay (HOSPITAL_COMMUNITY): Payer: Medicare HMO

## 2014-02-20 ENCOUNTER — Encounter (HOSPITAL_COMMUNITY): Payer: Medicare HMO

## 2014-02-20 DIAGNOSIS — I10 Essential (primary) hypertension: Secondary | ICD-10-CM | POA: Insufficient documentation

## 2014-02-20 DIAGNOSIS — I6350 Cerebral infarction due to unspecified occlusion or stenosis of unspecified cerebral artery: Secondary | ICD-10-CM | POA: Insufficient documentation

## 2014-02-20 DIAGNOSIS — I5032 Chronic diastolic (congestive) heart failure: Secondary | ICD-10-CM | POA: Insufficient documentation

## 2014-02-20 DIAGNOSIS — I2510 Atherosclerotic heart disease of native coronary artery without angina pectoris: Secondary | ICD-10-CM

## 2014-02-20 DIAGNOSIS — Z5189 Encounter for other specified aftercare: Secondary | ICD-10-CM | POA: Insufficient documentation

## 2014-02-20 DIAGNOSIS — S2239XA Fracture of one rib, unspecified side, initial encounter for closed fracture: Secondary | ICD-10-CM

## 2014-02-20 DIAGNOSIS — E119 Type 2 diabetes mellitus without complications: Secondary | ICD-10-CM

## 2014-02-20 DIAGNOSIS — I6529 Occlusion and stenosis of unspecified carotid artery: Secondary | ICD-10-CM | POA: Insufficient documentation

## 2014-02-20 LAB — CBC
HEMATOCRIT: 27.7 % — AB (ref 36.0–46.0)
HEMOGLOBIN: 8.6 g/dL — AB (ref 12.0–15.0)
MCH: 28.7 pg (ref 26.0–34.0)
MCHC: 31 g/dL (ref 30.0–36.0)
MCV: 92.3 fL (ref 78.0–100.0)
Platelets: 265 10*3/uL (ref 150–400)
RBC: 3 MIL/uL — AB (ref 3.87–5.11)
RDW: 14.9 % (ref 11.5–15.5)
WBC: 9.5 10*3/uL (ref 4.0–10.5)

## 2014-02-20 LAB — PREPARE PLATELET PHERESIS: Unit division: 0

## 2014-02-20 LAB — GLUCOSE, CAPILLARY
GLUCOSE-CAPILLARY: 172 mg/dL — AB (ref 70–99)
GLUCOSE-CAPILLARY: 220 mg/dL — AB (ref 70–99)
Glucose-Capillary: 133 mg/dL — ABNORMAL HIGH (ref 70–99)
Glucose-Capillary: 134 mg/dL — ABNORMAL HIGH (ref 70–99)

## 2014-02-20 LAB — APTT: aPTT: 28 seconds (ref 24–37)

## 2014-02-20 LAB — BASIC METABOLIC PANEL
Anion gap: 9 (ref 5–15)
BUN: 19 mg/dL (ref 6–23)
CALCIUM: 8.6 mg/dL (ref 8.4–10.5)
CHLORIDE: 100 meq/L (ref 96–112)
CO2: 28 meq/L (ref 19–32)
CREATININE: 1.14 mg/dL — AB (ref 0.50–1.10)
GFR calc Af Amer: 50 mL/min — ABNORMAL LOW (ref >90)
GFR calc non Af Amer: 43 mL/min — ABNORMAL LOW (ref >90)
GLUCOSE: 231 mg/dL — AB (ref 70–99)
Potassium: 5.4 mEq/L — ABNORMAL HIGH (ref 3.7–5.3)
Sodium: 137 mEq/L (ref 137–147)

## 2014-02-20 LAB — CORTISOL: CORTISOL PLASMA: 29.7 ug/dL

## 2014-02-20 LAB — URINE CULTURE

## 2014-02-20 LAB — PROTIME-INR
INR: 1.15 (ref 0.00–1.49)
Prothrombin Time: 14.7 seconds (ref 11.6–15.2)

## 2014-02-20 MED ORDER — NICARDIPINE HCL IN NACL 20-0.86 MG/200ML-% IV SOLN
INTRAVENOUS | Status: AC
  Administered 2014-02-20: 5 mg
  Filled 2014-02-20: qty 200

## 2014-02-20 MED ORDER — NICARDIPINE HCL IN NACL 20-0.86 MG/200ML-% IV SOLN: 3.0000 mg/h | INTRAVENOUS | Status: DC

## 2014-02-20 MED ORDER — NICARDIPINE HCL IN NACL 20-0.86 MG/200ML-% IV SOLN
3.0000 mg/h | Freq: Once | INTRAVENOUS | Status: AC
  Administered 2014-02-20: 3 mg/h via INTRAVENOUS

## 2014-02-20 MED ORDER — LIDOCAINE 5 % EX PTCH
1.0000 | MEDICATED_PATCH | CUTANEOUS | Status: DC
  Administered 2014-02-20 – 2014-02-27 (×8): 1 via TRANSDERMAL
  Filled 2014-02-20 (×8): qty 1

## 2014-02-20 MED ORDER — ONDANSETRON HCL 4 MG/2ML IJ SOLN
4.0000 mg | INTRAMUSCULAR | Status: DC | PRN
  Administered 2014-02-20 – 2014-02-27 (×3): 4 mg via INTRAVENOUS
  Filled 2014-02-20 (×2): qty 2

## 2014-02-20 MED ORDER — ONDANSETRON HCL 4 MG/2ML IJ SOLN
INTRAMUSCULAR | Status: AC
  Filled 2014-02-20: qty 2

## 2014-02-20 NOTE — Progress Notes (Signed)
Patient ID: Dana Blackwell, female   DOB: 1927/11/21, 80 y.o.   MRN: [id number] Afeb, vss No new neuro issues. Remains awake, and alert, and conversant CT is stable with no increasing hemorrhage. Continue present rx. Doubt any intervention will need to be considered.

## 2014-02-20 NOTE — Progress Notes (Signed)
Patient ID: Dana Blackwell, female   DOB: 09-05-1927, 80 y.o.   MRN: [id number]   LOS: 1 day   Subjective: Feeling ok, denies significant pain.   Objective: Vital signs in last 24 hours: Temp:  [97.4 F (36.3 C)-98.8 F (37.1 C)] 97.8 F (36.6 C) (07/03 0331) Pulse Rate:  [58-70] 58 (07/03 0600) Resp:  [12-21] 17 (07/03 0600) BP: (122-206)/(43-83) 139/57 mmHg (07/03 0600) SpO2:  [89 %-100 %] 98 % (07/03 0600) Weight:  [146 lb 13.2 oz (66.6 kg)-147 lb (66.679 kg)] 146 lb 13.2 oz (66.6 kg) (07/03 0500)    Laboratory  CBC  Recent Labs  02/19/14 1505 02/20/14 0250  WBC 7.7 9.5  HGB 9.9* 8.6*  HCT 32.6* 27.7*  PLT 279 265   BMET  Recent Labs  02/19/14 1505 02/20/14 0250  NA 137 137  K 4.8 5.4*  CL 98 100  CO2 27 28  GLUCOSE 175* 231*  BUN 23 19  CREATININE 1.20* 1.14*  CALCIUM 8.9 8.6   Lab Results  Component Value Date   INR 1.15 02/20/2014   INR 1.04 02/19/2014   INR 1.01 11/24/2013    Radiology Results CT HEAD WITHOUT CONTRAST  TECHNIQUE:  Contiguous axial images were obtained from the base of the skull  through the vertex without intravenous contrast.  COMPARISON: 02/19/2014  FINDINGS:  No acute fracture. The mastoids, middle ears, and imaged paranasal  sinuses remain aerated. No acute soft tissue findings in the orbits.  Similar degree of subarachnoid blood within sulci of the right more  than left cerebral convexities. Hemorrhage is again noted within the  basal cisterns. Intraventricular hemorrhage has increased and  volume, layering in the occipital horns of the lateral ventricles.  There is no evidence of hydrocephalus. No evidence of acute infarct,  brain edema, shift, or herniation. Lacunar spaces in the bilateral  putamen are again noted.  IMPRESSION:  1. Stable volume of subarachnoid hemorrhage.  2. Increased, but still small volume, intraventricular hemorrhage at  the lateral ventricles. No hydrocephalus.   Physical Exam General  appearance: alert and no distress Resp: clear to auscultation bilaterally Cardio: regular rate and rhythm GI: normal findings: bowel sounds normal and soft, non-tender Neuro: A&Ox3, PERRL, GCS=15   Assessment/Plan: Fall -- Etiology is uncertain with unwitnessed fall. Mechanical vs syncopal. Leave to primary service.  TBI w/bilateral diffuse SAH, R>L -- Repeat head CT unchanged Right first rib fx -- Aggressive pulmonary toilet. Would try to avoid narcotics if at all possible. PT/OT Multiple medical problems -- per primary service    Lisette Abu, PA-C Pager: [phone] General Trauma PA Pager: [phone]  02/20/2014

## 2014-02-20 NOTE — Progress Notes (Signed)
PULMONARY / CRITICAL CARE MEDICINE  Name: Dana Blackwell MRN: [id number] DOB: [birth year]    ADMISSION DATE:  02/19/2014 CONSULTATION DATE:  02/19/2014  REFERRING MD :  EDP PRIMARY SERVICE: PCCM  CHIEF COMPLAINT:  SAH s/p fall  BRIEF PATIENT DESCRIPTION: 80 yo female with CAD, HTN, CVA, DM, COPD presents to [facility] ED 7/2 after fall. In ED found to have acute SAH. PCCM asked to evaluate for ICU admission.   SIGNIFICANT EVENTS / STUDIES:  7/2  CT head/c spine > Subarachnoid hemorrhage including significant blood in the basilar cisterns. Trauma vs aneurysm. fracture R 1st rib. 7/2  CTA head > No focal aneurysm to explain the patient's hemorrhage. Chronic small vessel disease changes.   LINES / TUBES:  CULTURES:  ANTIBIOTICS:  INTERVAL HISTORY: Doing well per RN.  Complains of rib pain.  VITAL SIGNS: Temp:  [97.4 F (36.3 C)-98.8 F (37.1 C)] 97.8 F (36.6 C) (07/03 0331) Pulse Rate:  [56-70] 58 (07/03 0900) Resp:  [12-21] 19 (07/03 0900) BP: (122-206)/(43-83) 161/49 mmHg (07/03 0900) SpO2:  [89 %-100 %] 96 % (07/03 0900) Weight:  [66.6 kg (146 lb 13.2 oz)-66.679 kg (147 lb)] 66.6 kg (146 lb 13.2 oz) (07/03 0500)  HEMODYNAMICS:   VENTILATOR SETTINGS:   INTAKE / OUTPUT: Intake/Output     07/02 0701 - 07/03 0700 07/03 0701 - 07/04 0700   P.O. 240    I.V. (mL/kg) 303.2 (4.6) 30 (0.5)   Blood 211    Total Intake(mL/kg) 754.2 (11.3) 30 (0.5)   Urine (mL/kg/hr) 350    Total Output 350     Net +404.2 +30        Urine Occurrence 1 x      PHYSICAL EXAMINATION: General:  No distress Neuro:  Awake, alert, following commands, non focal HEENT:  PERRL Cardiovascular:  Regular, no murmurs Lungs:  CTAB Abdomen:  Soft, non-tender, non - distended Musculoskeletal:  No edema Skin:  Abrasion R temporal area  LABS:  CBC  Recent Labs Lab 02/19/14 1505 02/20/14 0250  WBC 7.7 9.5  HGB 9.9* 8.6*  HCT 32.6* 27.7*  PLT 279 265   Coag's  Recent Labs Lab 02/19/14 2216  02/20/14 0250  APTT  --  28  INR 1.04 1.15   BMET  Recent Labs Lab 02/19/14 1505 02/20/14 0250  NA 137 137  K 4.8 5.4*  CL 98 100  CO2 27 28  BUN 23 19  CREATININE 1.20* 1.14*  GLUCOSE 175* 231*   Electrolytes  Recent Labs Lab 02/19/14 1505 02/20/14 0250  CALCIUM 8.9 8.6   Sepsis Markers No results found for this basename: LATICACIDVEN, PROCALCITON, O2SATVEN,  in the last 168 hours ABG No results found for this basename: PHART, PCO2ART, PO2ART,  in the last 168 hours Liver Enzymes No results found for this basename: AST, ALT, ALKPHOS, BILITOT, ALBUMIN,  in the last 168 hours Cardiac Enzymes  Recent Labs Lab 02/19/14 1505  TROPONINI <0.30   Glucose  Recent Labs Lab 02/19/14 1425 02/19/14 2125 02/20/14 0802  GLUCAP 183* 171* 133*   IMAGING:   Ct Angio Head W/cm &/or Wo Cm  02/19/2014   CLINICAL DATA:  Fall.  Intracranial hemorrhage.  EXAM: CT ANGIOGRAPHY HEAD  TECHNIQUE: Multidetector CT imaging of the head was performed using the standard protocol during bolus administration of intravenous contrast. Multiplanar CT image reconstructions and MIPs were obtained to evaluate the vascular anatomy.  CONTRAST:  74mL OMNIPAQUE IOHEXOL 350 MG/ML SOLN  COMPARISON:  CT head without contrast  02/19/2014.  FINDINGS: Dense atherosclerotic calcifications are present within the cavernous carotid arteries bilaterally. The lumen of the right internal carotid artery is narrowed to less than 1 mm. The lumen of the left internal carotid artery is narrowed to 1.2 mm. The ICA termini are intact bilaterally. The A1 and M1 segments are within normal limits. The MCA bifurcations are intact. There is no significant aneurysm within the anterior circulation. There is some attenuation of distal ACA and MCA branch vessels without a significant proximal stenosis or occlusion.  The left vertebral artery is the dominant vessel. The PICA origins are visualized and normal bilaterally. The right vertebral  artery terminates at the PICA. The left vertebral artery continues as the basilar artery with mild proximal narrowing. Both posterior cerebral arteries originate from the basilar tip. The PCA branch vessels are within normal limits bilaterally.  The dural sinuses are patent.  Cortical veins are intact.  Review of the MIP images confirms the above findings.  IMPRESSION: 1. No focal aneurysm to explain the patient's hemorrhage. 2. Dense atherosclerotic calcifications with moderate stenoses in the cavernous ICA segments bilaterally. 3. Mild small vessel disease involving the ACA and MCA branch vessels bilaterally. No significant proximal stenosis or occlusion.   Electronically Signed   By: Lawrence Santiago M.D.   On: 02/19/2014 18:27   Ct Head Wo Contrast  02/20/2014   CLINICAL DATA:  Followup traumatic brain injury  EXAM: CT HEAD WITHOUT CONTRAST  TECHNIQUE: Contiguous axial images were obtained from the base of the skull through the vertex without intravenous contrast.  COMPARISON:  02/19/2014  FINDINGS: No acute fracture. The mastoids, middle ears, and imaged paranasal sinuses remain aerated. No acute soft tissue findings in the orbits.  Similar degree of subarachnoid blood within sulci of the right more than left cerebral convexities. Hemorrhage is again noted within the basal cisterns. Intraventricular hemorrhage has increased and volume, layering in the occipital horns of the lateral ventricles. There is no evidence of hydrocephalus. No evidence of acute infarct, brain edema, shift, or herniation. Lacunar spaces in the bilateral putamen are again noted.  IMPRESSION: 1. Stable volume of subarachnoid hemorrhage. 2. Increased, but still small volume, intraventricular hemorrhage at the lateral ventricles. No hydrocephalus.   Electronically Signed   By: Jorje Guild M.D.   On: 02/20/2014 04:58   Ct Head Wo Contrast  02/19/2014   CLINICAL DATA:  Fall  EXAM: CT HEAD WITHOUT CONTRAST  CT CERVICAL SPINE WITHOUT  CONTRAST  TECHNIQUE: Multidetector CT imaging of the head and cervical spine was performed following the standard protocol without intravenous contrast. Multiplanar CT image reconstructions of the cervical spine were also generated.  COMPARISON:  None.  FINDINGS: CT HEAD FINDINGS  Moderate to large subarachnoid hemorrhage. There is high-density acute hemorrhage in the basilar cisterns extending into the right sylvian fissure. There is subarachnoid hemorrhage over the convexity bilaterally, right greater than left. No intraventricular hemorrhage. No hydrocephalus.  Negative for acute infarct or mass.  Negative for skull fracture.  CT CERVICAL SPINE FINDINGS  Fracture the right first rib without significant displacement. No apical hematoma identified.  1 mm anterior slip C4-5. Extensive disc degeneration and spondylosis at C3-4 with right-sided facet degeneration. Bilateral facet degeneration at C4-5. Solid fusion at C5-6. Spondylosis at C6-7, C7-T1, and T1-2. Diffuse facet degeneration.  Negative for cervical spine fracture.  IMPRESSION: Moderate to advanced subarachnoid hemorrhage including significant blood in the basilar cisterns. This pattern could be seen with trauma however aneurysm rupture needs to be  considered based on the amount of blood in the basilar cisterns.  Nondisplaced fracture right first rib.  Advanced cervical spondylosis.  No cervical spine fracture.  Carotid artery calcification bilaterally.  Critical Value/emergent results were called by telephone at the time of interpretation on 02/19/2014 at 4:16 PM to Dr. Daleen Bo , who verbally acknowledged these results.   Electronically Signed   By: Franchot Gallo M.D.   On: 02/19/2014 16:17   Ct Cervical Spine Wo Contrast  02/19/2014   CLINICAL DATA:  Fall  EXAM: CT HEAD WITHOUT CONTRAST  CT CERVICAL SPINE WITHOUT CONTRAST  TECHNIQUE: Multidetector CT imaging of the head and cervical spine was performed following the standard protocol without  intravenous contrast. Multiplanar CT image reconstructions of the cervical spine were also generated.  COMPARISON:  None.  FINDINGS: CT HEAD FINDINGS  Moderate to large subarachnoid hemorrhage. There is high-density acute hemorrhage in the basilar cisterns extending into the right sylvian fissure. There is subarachnoid hemorrhage over the convexity bilaterally, right greater than left. No intraventricular hemorrhage. No hydrocephalus.  Negative for acute infarct or mass.  Negative for skull fracture.  CT CERVICAL SPINE FINDINGS  Fracture the right first rib without significant displacement. No apical hematoma identified.  1 mm anterior slip C4-5. Extensive disc degeneration and spondylosis at C3-4 with right-sided facet degeneration. Bilateral facet degeneration at C4-5. Solid fusion at C5-6. Spondylosis at C6-7, C7-T1, and T1-2. Diffuse facet degeneration.  Negative for cervical spine fracture.  IMPRESSION: Moderate to advanced subarachnoid hemorrhage including significant blood in the basilar cisterns. This pattern could be seen with trauma however aneurysm rupture needs to be considered based on the amount of blood in the basilar cisterns.  Nondisplaced fracture right first rib.  Advanced cervical spondylosis.  No cervical spine fracture.  Carotid artery calcification bilaterally.  Critical Value/emergent results were called by telephone at the time of interpretation on 02/19/2014 at 4:16 PM to Dr. Daleen Bo , who verbally acknowledged these results.   Electronically Signed   By: Franchot Gallo M.D.   On: 02/19/2014 16:17   Dg Chest Port 1 View  02/19/2014   CLINICAL DATA:  The patient fainted today.  EXAM: PORTABLE CHEST - 1 VIEW  COMPARISON:  November 23, 2013 the mediastinal contour is normal. The heart size is enlarged. There is bilateral diffuse increased pulmonary interstitium. Minimal bilateral pleural effusions are identified. The visualized skeletal structures are stable.  IMPRESSION: Mild congestive  heart failure.   Electronically Signed   By: Abelardo Diesel M.D.   On: 02/19/2014 15:07    ASSESSMENT / PLAN:  PULMONARY A: Chronic respiratory failure ( elevated peripheral bicarbonate ) ? R first rib fracture P:   Goal SpO2 > 92% Supplemental oxygen PRN Incentive spirometry  CARDIOVASCULAR A:  Hypertension CAD P:  SBP goal 120-160 per Neurosurgery D/c Cardene  Home Lisinopril, Metoprolol, Imdur, lipitor Holding Lasix Hold ASA / Plavix  RENAL A:   CKD P:   Monitor BMP Hold Lasix NS@50   GASTROINTESTINAL A:   GERD on PPI preadmission Nutrition P:   Protonix Diet  HEMATOLOGIC A:   Chronic anemia VTE Px P:  SCD  INFECTIOUS A:   No active issues P:   No intervention required  ENDOCRINE A:   DM Hypothyroidism P:   SSI Lantus 10 Synthroid  NEUROLOGIC A:   Traumatic SAH Pain H/o CVA, depression Syncope ( does not recall details of the fall )? P:   Tramadol / Vicodin Add Lidocaine patch Zoloft Neurosurgery following  Syncope work up?  I have personally obtained history, examined patient, evaluated and interpreted laboratory and imaging results, reviewed medical records, formulated assessment / plan and placed orders.  Doree Fudge, MD Pulmonary and [place] Pager: [phone]  02/20/2014, 11:25 AM

## 2014-02-20 NOTE — Progress Notes (Signed)
Patient doing much more today.  Not much to offer from a trauma standpoint and will sign off.  Call us if you have new concerns.  This patient has been seen and I agree with the findings and treatment plan.  Kathryne Eriksson. Dahlia Bailiff, MD, [place] [phone] (pager) [phone] (direct pager) Trauma Surgeon

## 2014-02-20 NOTE — Progress Notes (Signed)
UR completed.  Aki Abalos, RN BSN M[facility] CCM Trauma/Neuro ICU Case Manager [phone]  

## 2014-02-20 NOTE — Progress Notes (Signed)
[facility] Progress Note Patient Name: Dana Blackwell DOB: Feb 22, 1928 MRN: [id number]  Date of Service  02/20/2014   HPI/Events of Note   bp elevated   eICU Interventions  Resume cardene drip   Intervention Category Major Interventions: Hypertension - evaluation and management  Asencion Noble 02/20/2014, 6:43 PM

## 2014-02-21 LAB — BASIC METABOLIC PANEL
Anion gap: 11 (ref 5–15)
BUN: 22 mg/dL (ref 6–23)
CHLORIDE: 102 meq/L (ref 96–112)
CO2: 27 meq/L (ref 19–32)
Calcium: 8.7 mg/dL (ref 8.4–10.5)
Creatinine, Ser: 1.11 mg/dL — ABNORMAL HIGH (ref 0.50–1.10)
GFR calc Af Amer: 52 mL/min — ABNORMAL LOW (ref >90)
GFR calc non Af Amer: 45 mL/min — ABNORMAL LOW (ref >90)
Glucose, Bld: 154 mg/dL — ABNORMAL HIGH (ref 70–99)
POTASSIUM: 5 meq/L (ref 3.7–5.3)
SODIUM: 140 meq/L (ref 137–147)

## 2014-02-21 LAB — CBC
HCT: 28 % — ABNORMAL LOW (ref 36.0–46.0)
Hemoglobin: 8.6 g/dL — ABNORMAL LOW (ref 12.0–15.0)
MCH: 28.5 pg (ref 26.0–34.0)
MCHC: 30.7 g/dL (ref 30.0–36.0)
MCV: 92.7 fL (ref 78.0–100.0)
Platelets: 254 10*3/uL (ref 150–400)
RBC: 3.02 MIL/uL — AB (ref 3.87–5.11)
RDW: 14.9 % (ref 11.5–15.5)
WBC: 10 10*3/uL (ref 4.0–10.5)

## 2014-02-21 LAB — GLUCOSE, CAPILLARY
GLUCOSE-CAPILLARY: 140 mg/dL — AB (ref 70–99)
Glucose-Capillary: 165 mg/dL — ABNORMAL HIGH (ref 70–99)
Glucose-Capillary: 184 mg/dL — ABNORMAL HIGH (ref 70–99)
Glucose-Capillary: 151 mg/dL — ABNORMAL HIGH (ref 70–99)

## 2014-02-21 MED ORDER — INSULIN ASPART 100 UNIT/ML ~~LOC~~ SOLN
0.0000 [IU] | Freq: Three times a day (TID) | SUBCUTANEOUS | Status: DC
  Administered 2014-02-21 – 2014-02-22 (×4): 3 [IU] via SUBCUTANEOUS
  Administered 2014-02-23: 5 [IU] via SUBCUTANEOUS
  Administered 2014-02-23: 3 [IU] via SUBCUTANEOUS
  Administered 2014-02-24: 8 [IU] via SUBCUTANEOUS
  Administered 2014-02-24: 2 [IU] via SUBCUTANEOUS
  Administered 2014-02-24: 3 [IU] via SUBCUTANEOUS
  Administered 2014-02-25: 5 [IU] via SUBCUTANEOUS
  Administered 2014-02-25: 3 [IU] via SUBCUTANEOUS
  Administered 2014-02-26 – 2014-02-27 (×2): 5 [IU] via SUBCUTANEOUS
  Administered 2014-02-27: 3 [IU] via SUBCUTANEOUS

## 2014-02-21 MED ORDER — INSULIN ASPART 100 UNIT/ML ~~LOC~~ SOLN
0.0000 [IU] | Freq: Every day | SUBCUTANEOUS | Status: DC
Start: 2014-02-21 — End: 2014-02-27
  Administered 2014-02-25: 2 [IU] via SUBCUTANEOUS

## 2014-02-21 NOTE — Progress Notes (Signed)
Patient ID: Dana Blackwell, female   DOB: 06/18/1928, 80 y.o.   MRN: [id number]   LOS: 2 days   Subjective: Feels a little better today   Objective: Vital signs in last 24 hours: Temp:  [97.7 F (36.5 C)-99.1 F (37.3 C)] 98.9 F (37.2 C) (07/04 0304) Pulse Rate:  [53-64] 56 (07/04 0732) Resp:  [16-27] 16 (07/04 0732) BP: (127-192)/(44-69) 149/47 mmHg (07/04 0732) SpO2:  [91 %-100 %] 97 % (07/04 0732)    IS: 578ml   Laboratory  CBC  Recent Labs  02/20/14 0250 02/21/14 0240  WBC 9.5 10.0  HGB 8.6* 8.6*  HCT 27.7* 28.0*  PLT 265 254   BMET  Recent Labs  02/20/14 0250 02/21/14 0240  NA 137 140  K 5.4* 5.0  CL 100 102  CO2 28 27  GLUCOSE 231* 154*  BUN 19 22  CREATININE 1.14* 1.11*  CALCIUM 8.6 8.7    Physical Exam General appearance: alert and no distress Resp: clear to auscultation bilaterally Cardio: regular rate and rhythm GI: normal findings: bowel sounds normal and soft, non-tender   Assessment/Plan: Fall -- Etiology is uncertain with unwitnessed fall. Mechanical vs syncopal. Leave to primary service.  TBI w/bilateral diffuse SAH, R>L -- Repeat head CT unchanged  Right first rib fx -- Aggressive pulmonary toilet. Would try to avoid narcotics if at all possible. PT/OT  Multiple medical problems -- per primary service    Lisette Abu, PA-C Pager: [phone] General Trauma PA Pager: [phone]  02/21/2014

## 2014-02-21 NOTE — Progress Notes (Signed)
PULMONARY / CRITICAL CARE MEDICINE  Name: Dana Blackwell MRN: [id number] DOB: 1928/07/17    ADMISSION DATE:  02/19/2014 CONSULTATION DATE:  02/19/2014  REFERRING MD :  EDP PRIMARY SERVICE: PCCM  CHIEF COMPLAINT:  SAH s/p fall  BRIEF PATIENT DESCRIPTION: 80 yo female with CAD, HTN, CVA, DM, COPD presents to [facility] ED 7/2 after fall. In ED found to have acute SAH. PCCM asked to evaluate for ICU admission.   SIGNIFICANT EVENTS / STUDIES:  7/2  CT head/c spine > Subarachnoid hemorrhage including significant blood in the basilar cisterns. Trauma vs aneurysm. fracture R 1st rib. 7/2  CTA head > No focal aneurysm to explain the patient's hemorrhage. Chronic small vessel disease changes.   LINES / TUBES:  CULTURES:  ANTIBIOTICS:  INTERVAL HISTORY: Transiently hypertensive, on Cardene gtt, improved after given preadmission Rx  VITAL SIGNS: Temp:  [97.7 F (36.5 C)-99.1 F (37.3 C)] 97.9 F (36.6 C) (07/04 0753) Pulse Rate:  [51-64] 52 (07/04 1200) Resp:  [15-22] 16 (07/04 1200) BP: (127-192)/(44-69) 150/62 mmHg (07/04 1200) SpO2:  [91 %-100 %] 99 % (07/04 1200)  HEMODYNAMICS:   VENTILATOR SETTINGS:   INTAKE / OUTPUT: Intake/Output     07/03 0701 - 07/04 0700 07/04 0701 - 07/05 0700   P.O. 560 720   I.V. (mL/kg) 290 (4.4)    Blood     Total Intake(mL/kg) 850 (12.8) 720 (10.8)   Urine (mL/kg/hr) 1750 (1.1)    Total Output 1750     Net -900 +720         PHYSICAL EXAMINATION: General:  Comfortable Neuro:  Awake, alert, nonfocal HEENT:  PERRL Cardiovascular:  Regular, no murmurs Lungs:  Clear, no added sounds Abdomen:  Soft, non-tender, non - distended Musculoskeletal:  No edema Skin:  Abrasion R temporal area  LABS:  CBC  Recent Labs Lab 02/19/14 1505 02/20/14 0250 02/21/14 0240  WBC 7.7 9.5 10.0  HGB 9.9* 8.6* 8.6*  HCT 32.6* 27.7* 28.0*  PLT 279 265 254   Coag's  Recent Labs Lab 02/19/14 2216 02/20/14 0250  APTT  --  28  INR 1.04 1.15    BMET  Recent Labs Lab 02/19/14 1505 02/20/14 0250 02/21/14 0240  NA 137 137 140  K 4.8 5.4* 5.0  CL 98 100 102  CO2 27 28 27   BUN 23 19 22   CREATININE 1.20* 1.14* 1.11*  GLUCOSE 175* 231* 154*   Electrolytes  Recent Labs Lab 02/19/14 1505 02/20/14 0250 02/21/14 0240  CALCIUM 8.9 8.6 8.7   Sepsis Markers No results found for this basename: LATICACIDVEN, PROCALCITON, O2SATVEN,  in the last 168 hours ABG No results found for this basename: PHART, PCO2ART, PO2ART,  in the last 168 hours Liver Enzymes No results found for this basename: AST, ALT, ALKPHOS, BILITOT, ALBUMIN,  in the last 168 hours Cardiac Enzymes  Recent Labs Lab 02/19/14 1505  TROPONINI <0.30   Glucose  Recent Labs Lab 02/20/14 0802 02/20/14 1206 02/20/14 1645 02/20/14 2145 02/21/14 0744 02/21/14 1200  GLUCAP 133* 172* 220* 134* 140* 165*   IMAGING:   Ct Angio Head W/cm &/or Wo Cm  02/19/2014   CLINICAL DATA:  Fall.  Intracranial hemorrhage.  EXAM: CT ANGIOGRAPHY HEAD  TECHNIQUE: Multidetector CT imaging of the head was performed using the standard protocol during bolus administration of intravenous contrast. Multiplanar CT image reconstructions and MIPs were obtained to evaluate the vascular anatomy.  CONTRAST:  61mL OMNIPAQUE IOHEXOL 350 MG/ML SOLN  COMPARISON:  CT head without contrast  02/19/2014.  FINDINGS: Dense atherosclerotic calcifications are present within the cavernous carotid arteries bilaterally. The lumen of the right internal carotid artery is narrowed to less than 1 mm. The lumen of the left internal carotid artery is narrowed to 1.2 mm. The ICA termini are intact bilaterally. The A1 and M1 segments are within normal limits. The MCA bifurcations are intact. There is no significant aneurysm within the anterior circulation. There is some attenuation of distal ACA and MCA branch vessels without a significant proximal stenosis or occlusion.  The left vertebral artery is the dominant  vessel. The PICA origins are visualized and normal bilaterally. The right vertebral artery terminates at the PICA. The left vertebral artery continues as the basilar artery with mild proximal narrowing. Both posterior cerebral arteries originate from the basilar tip. The PCA branch vessels are within normal limits bilaterally.  The dural sinuses are patent.  Cortical veins are intact.  Review of the MIP images confirms the above findings.  IMPRESSION: 1. No focal aneurysm to explain the patient's hemorrhage. 2. Dense atherosclerotic calcifications with moderate stenoses in the cavernous ICA segments bilaterally. 3. Mild small vessel disease involving the ACA and MCA branch vessels bilaterally. No significant proximal stenosis or occlusion.   Electronically Signed   By: Lawrence Santiago M.D.   On: 02/19/2014 18:27   Ct Head Wo Contrast  02/20/2014   CLINICAL DATA:  Followup traumatic brain injury  EXAM: CT HEAD WITHOUT CONTRAST  TECHNIQUE: Contiguous axial images were obtained from the base of the skull through the vertex without intravenous contrast.  COMPARISON:  02/19/2014  FINDINGS: No acute fracture. The mastoids, middle ears, and imaged paranasal sinuses remain aerated. No acute soft tissue findings in the orbits.  Similar degree of subarachnoid blood within sulci of the right more than left cerebral convexities. Hemorrhage is again noted within the basal cisterns. Intraventricular hemorrhage has increased and volume, layering in the occipital horns of the lateral ventricles. There is no evidence of hydrocephalus. No evidence of acute infarct, brain edema, shift, or herniation. Lacunar spaces in the bilateral putamen are again noted.  IMPRESSION: 1. Stable volume of subarachnoid hemorrhage. 2. Increased, but still small volume, intraventricular hemorrhage at the lateral ventricles. No hydrocephalus.   Electronically Signed   By: Jorje Guild M.D.   On: 02/20/2014 04:58   Ct Head Wo Contrast  02/19/2014    CLINICAL DATA:  Fall  EXAM: CT HEAD WITHOUT CONTRAST  CT CERVICAL SPINE WITHOUT CONTRAST  TECHNIQUE: Multidetector CT imaging of the head and cervical spine was performed following the standard protocol without intravenous contrast. Multiplanar CT image reconstructions of the cervical spine were also generated.  COMPARISON:  None.  FINDINGS: CT HEAD FINDINGS  Moderate to large subarachnoid hemorrhage. There is high-density acute hemorrhage in the basilar cisterns extending into the right sylvian fissure. There is subarachnoid hemorrhage over the convexity bilaterally, right greater than left. No intraventricular hemorrhage. No hydrocephalus.  Negative for acute infarct or mass.  Negative for skull fracture.  CT CERVICAL SPINE FINDINGS  Fracture the right first rib without significant displacement. No apical hematoma identified.  1 mm anterior slip C4-5. Extensive disc degeneration and spondylosis at C3-4 with right-sided facet degeneration. Bilateral facet degeneration at C4-5. Solid fusion at C5-6. Spondylosis at C6-7, C7-T1, and T1-2. Diffuse facet degeneration.  Negative for cervical spine fracture.  IMPRESSION: Moderate to advanced subarachnoid hemorrhage including significant blood in the basilar cisterns. This pattern could be seen with trauma however aneurysm rupture needs to be  considered based on the amount of blood in the basilar cisterns.  Nondisplaced fracture right first rib.  Advanced cervical spondylosis.  No cervical spine fracture.  Carotid artery calcification bilaterally.  Critical Value/emergent results were called by telephone at the time of interpretation on 02/19/2014 at 4:16 PM to Dr. Daleen Bo , who verbally acknowledged these results.   Electronically Signed   By: Franchot Gallo M.D.   On: 02/19/2014 16:17   Ct Cervical Spine Wo Contrast  02/19/2014   CLINICAL DATA:  Fall  EXAM: CT HEAD WITHOUT CONTRAST  CT CERVICAL SPINE WITHOUT CONTRAST  TECHNIQUE: Multidetector CT imaging of the head  and cervical spine was performed following the standard protocol without intravenous contrast. Multiplanar CT image reconstructions of the cervical spine were also generated.  COMPARISON:  None.  FINDINGS: CT HEAD FINDINGS  Moderate to large subarachnoid hemorrhage. There is high-density acute hemorrhage in the basilar cisterns extending into the right sylvian fissure. There is subarachnoid hemorrhage over the convexity bilaterally, right greater than left. No intraventricular hemorrhage. No hydrocephalus.  Negative for acute infarct or mass.  Negative for skull fracture.  CT CERVICAL SPINE FINDINGS  Fracture the right first rib without significant displacement. No apical hematoma identified.  1 mm anterior slip C4-5. Extensive disc degeneration and spondylosis at C3-4 with right-sided facet degeneration. Bilateral facet degeneration at C4-5. Solid fusion at C5-6. Spondylosis at C6-7, C7-T1, and T1-2. Diffuse facet degeneration.  Negative for cervical spine fracture.  IMPRESSION: Moderate to advanced subarachnoid hemorrhage including significant blood in the basilar cisterns. This pattern could be seen with trauma however aneurysm rupture needs to be considered based on the amount of blood in the basilar cisterns.  Nondisplaced fracture right first rib.  Advanced cervical spondylosis.  No cervical spine fracture.  Carotid artery calcification bilaterally.  Critical Value/emergent results were called by telephone at the time of interpretation on 02/19/2014 at 4:16 PM to Dr. Daleen Bo , who verbally acknowledged these results.   Electronically Signed   By: Franchot Gallo M.D.   On: 02/19/2014 16:17   Dg Chest Port 1 View  02/19/2014   CLINICAL DATA:  The patient fainted today.  EXAM: PORTABLE CHEST - 1 VIEW  COMPARISON:  November 23, 2013 the mediastinal contour is normal. The heart size is enlarged. There is bilateral diffuse increased pulmonary interstitium. Minimal bilateral pleural effusions are identified. The  visualized skeletal structures are stable.  IMPRESSION: Mild congestive heart failure.   Electronically Signed   By: Abelardo Diesel M.D.   On: 02/19/2014 15:07   ASSESSMENT / PLAN:  PULMONARY A: Chronic respiratory failure ( elevated peripheral bicarbonate ) ? R first rib fracture P:   Goal SpO2 > 92% Supplemental oxygen PRN Incentive spirometry  CARDIOVASCULAR A:  Hypertension CAD P:  SBP goal 120-160 per Neurosurgery Home Lisinopril, Metoprolol, Imdur, lipitor Holding Lasix Hold ASA / Plavix  RENAL A:   CKD P:   Trend BMP Hold Lasix D/c IVF  GASTROINTESTINAL A:   GERD on PPI preadmission Nutrition P:   Protonix Diet  HEMATOLOGIC A:   Chronic anemia VTE Px P:  SCD  INFECTIOUS A:   No active issues P:   No intervention required  ENDOCRINE A:   DM Hypothyroidism P:   SSI Lantus 10 Synthroid  NEUROLOGIC A:   Traumatic SAH Pain H/o CVA, depression Syncope ( does not recall details of the fall )? P:   Tramadol / Vicodin / Lidocaine patch Zoloft Neurosurgery following, OK to floor  Syncope work up?  Transfer to medical floor 7/4.  TRH to assume care 7/5.  PCCM will sign off.  I have personally obtained history, examined patient, evaluated and interpreted laboratory and imaging results, reviewed medical records, formulated assessment / plan and placed orders.  Doree Fudge, MD Pulmonary and [place] Pager: [phone]  02/21/2014, 12:37 PM

## 2014-02-21 NOTE — Progress Notes (Signed)
PT Cancellation Note  Patient Details Name: Dana Blackwell MRN: [id number] DOB: 05-Jun-1928   Cancelled Treatment:    Reason Eval/Treat Not Completed: Patient declined, no reason specified. Pt reports pain 9/10 in ribs; refusing ambulation this time. Will re-attempt at next available time.    Elie Confer [place], [place] 02/21/2014, 12:38 PM

## 2014-02-21 NOTE — Evaluation (Signed)
Occupational Therapy Evaluation Patient Details Name: Dana Blackwell MRN: [id number] DOB: Jun 06, 1928 Today's Date: 02/21/2014    History of Present Illness 80 yo female with CAD, HTN, CVA, DM, COPD presents to [facility] ED 7/2 s/p mechanical fall. In ED found to have acute SAH. CT of head showed Moderate to advanced subarachnoid hemorrhage including significant blood in the basilar cisterns.    Clinical Impression   Pt admitted with above. She demonstrates the below listed deficits and will benefit from continued OT to maximize safety and independence with BADLs.  Pt presents to OT with Pain, and limited Rt. UE function due to pain, and impaired cognition - decreased awareness, decreased safety; decreased problem solving.  Unsure of pt's baseline as no family is present.  She was living alone and functioning independently PTA.  Currently, she requires mod A for BADLs and min guard assist for functional mobility.  Pt reports she has no family locally, and has no one that can assist her post discharge.  She is going to require 24 hour assistance as I doubt she will be able to manage her 02 at home, or perform IADLs.  Recommend SNF level rehab.       Follow Up Recommendations  SNF;Supervision/Assistance - 24 hour    Equipment Recommendations       Recommendations for Other Services       Precautions / Restrictions Precautions Precautions: Fall      Mobility Bed Mobility Overal bed mobility: Needs Assistance Bed Mobility: Rolling;Sidelying to Sit Rolling: Mod assist Sidelying to sit: Mod assist       General bed mobility comments: Pt requires step by step cues and assist to roll and to lift trunk   Transfers Overall transfer level: Needs assistance Equipment used: Rolling walker (2 wheeled) Transfers: Sit to/from [facility] Sit to Stand: Min guard Stand pivot transfers: Min guard       General transfer comment: min guard for safety     Balance Overall  balance assessment: Needs assistance Sitting-balance support: Feet supported Sitting balance-Leahy Scale: Good     Standing balance support: No upper extremity supported Standing balance-Leahy Scale: Fair                              ADL Overall ADL's : Needs assistance/impaired Eating/Feeding: Set up;Sitting   Grooming: Wash/dry hands;Wash/dry face;Brushing hair;Oral care;Min guard;Standing   Upper Body Bathing: Minimal assitance;Sitting   Lower Body Bathing: Moderate assistance;Sit to/from stand   Upper Body Dressing : Moderate assistance;Sitting   Lower Body Dressing: Total assistance;Sit to/from stand Lower Body Dressing Details (indicate cue type and reason): limited by pain  Toilet Transfer: Min guard;Ambulation;RW   Toileting- Clothing Manipulation and Hygiene: Minimal assistance;Sit to/from stand       Functional mobility during ADLs: Rolling walker;Min guard General ADL Comments: Pt limited by pain.  Unable to access feet and LEs at this time to perform LB ADLs     Vision                     Perception     Praxis Praxis Praxis tested?: Within functional limits    Pertinent Vitals/Pain 6/10 Rt. Rib cage.      Hand Dominance Right   Extremity/Trunk Assessment Upper Extremity Assessment Upper Extremity Assessment: RUE deficits/detail RUE Deficits / Details: shoulder flexion to ~80* - limited by pain  RUE: Unable to fully assess due to pain  Lower Extremity Assessment Lower Extremity Assessment: Defer to PT evaluation   Cervical / Trunk Assessment Cervical / Trunk Assessment: Normal   Communication Communication Communication: No difficulties   Cognition Arousal/Alertness: Awake/alert Behavior During Therapy: WFL for tasks assessed/performed Overall Cognitive Status: No family/caregiver present to determine baseline cognitive functioning Area of Impairment: Orientation;Safety/judgement;Awareness;Problem solving Orientation  Level: Disoriented to;Time (confused as to the exact date and day.  )       Safety/Judgement: Decreased awareness of safety Awareness: Intellectual Problem Solving: Slow processing;Requires verbal cues;Requires tactile cues General Comments: Pt unable to correlate how current deficits will effect her functionally.  Poor awareness of her needs at discharge.  Requires min A to problem solve through unfamiliar tasks.  Unsure if this is a new deficit or pt's baseline as no family present   General Comments       Exercises       Shoulder Instructions      Home Living Family/patient expects to be discharged to:: Private residence Living Arrangements: Alone Available Help at Discharge:  (Pt reports she has no one who can assist her) Type of Home: Apartment Home Access: Stairs to enter Entrance Stairs-Number of Steps: 1 Entrance Stairs-Rails: None Home Layout: One level     Bathroom Shower/Tub: Tub/shower unit;Curtain Shower/tub characteristics: Architectural technologist: Standard     Home Equipment: Environmental consultant - 4 wheels;Shower seat          Prior Functioning/Environment Level of Independence: Independent with assistive device(s)        Comments: Pt used 2L 02 PTA.  She denies h/o falls    OT Diagnosis: Generalized weakness;Cognitive deficits;Acute pain   OT Problem List: Decreased strength;Decreased activity tolerance;Impaired balance (sitting and/or standing);Decreased cognition;Decreased knowledge of use of DME or AE;Pain;Impaired UE functional use   OT Treatment/Interventions: Self-care/ADL training;DME and/or AE instruction;Therapeutic activities;Cognitive remediation/compensation;Patient/family education;Balance training    OT Goals(Current goals can be found in the care plan section) Acute Rehab OT Goals Patient Stated Goal: To get better OT Goal Formulation: With patient Time For Goal Achievement: 03/07/14 Potential to Achieve Goals: Good ADL Goals Pt Will Perform  Grooming: with supervision;standing Pt Will Perform Upper Body Bathing: with set-up;with supervision;sitting Pt Will Perform Lower Body Bathing: with supervision;sit to/from stand Pt Will Perform Upper Body Dressing: with supervision;sitting Pt Will Perform Lower Body Dressing: with supervision;sit to/from stand;with adaptive equipment Pt Will Transfer to Toilet: with supervision;ambulating;regular height toilet;grab bars Pt Will Perform Toileting - Clothing Manipulation and hygiene: with supervision;sit to/from stand Pt Will Perform Tub/Shower Transfer: Tub transfer;with supervision;shower seat;rolling walker;grab bars  OT Frequency: Min 3X/week   Barriers to D/C: Decreased caregiver support          Co-evaluation              End of Session Equipment Utilized During Treatment: Rolling walker;Oxygen Nurse Communication: Mobility status  Activity Tolerance: Patient limited by fatigue;Patient limited by pain Patient left: with call bell/phone within reach;in chair   Time: 3220-2542 OT Time Calculation (min): 38 min Charges:  OT General Charges $OT Visit: 1 Procedure OT Evaluation $Initial OT Evaluation Tier I: 1 Procedure OT Treatments $Self Care/Home Management : 8-22 mins $Therapeutic Activity: 8-22 mins G-Codes:    Malikhi Ogan M 03/13/14, 12:30 PM

## 2014-02-21 NOTE — Evaluation (Signed)
Physical Therapy Evaluation Patient Details Name: Trenyce Loera MRN: [id number] DOB: Dec 12, 1927 Today's Date: 02/21/2014   History of Present Illness  80 yo female with CAD, HTN, CVA, DM, COPD presents to [facility] ED 7/2 s/p mechanical fall. In ED found to have acute SAH. CT of head showed Moderate to advanced subarachnoid hemorrhage including significant blood in the basilar cisterns.   Clinical Impression  Pt adm from home due to the above. Presents with decreased independence with functional mobility and poor activity tolerance. Pt to benefit from skilled acute PT to address deficits and maximize mobility prior to D/C to next appropriate venue. Pt very pleasant and motivated during today's session. Pt reports increased difficulties recently with ADLs and mobility at home. Pt has no family support and would benefit from SNF for post acute rehab to increase independence with mobility and reduce risk of fall again.     Follow Up Recommendations SNF;Supervision/Assistance - 24 hour    Equipment Recommendations  Rolling walker with 5" wheels    Recommendations for Other Services       Precautions / Restrictions Precautions Precautions: Fall Precaution Comments: pt on 2L O2 at home Restrictions Weight Bearing Restrictions: No      Mobility  Bed Mobility Overal bed mobility: Needs Assistance Bed Mobility: Rolling;Sidelying to Sit Rolling: Mod assist Sidelying to sit: Mod assist       General bed mobility comments: not assessed; pt in recliner and returned to wheelchair   Transfers Overall transfer level: Needs assistance Equipment used: Rolling walker (2 wheeled) Transfers: Sit to/from Stand Sit to Stand: Min assist Stand pivot transfers: Min guard       General transfer comment: min (A) from lower surface (ie toilet); cues for hand placement and safety   Ambulation/Gait Ambulation/Gait assistance: Min assist Ambulation Distance (Feet): 60 Feet Assistive device:  Rolling walker (2 wheeled) Gait Pattern/deviations: Step-through pattern;Decreased stride length;Narrow base of support Gait velocity: very decreased Gait velocity interpretation: Below normal speed for age/gender General Gait Details: limited mobility due to SOB and fatigue; pt reported "i feel like my legs are going to give out"; cues for safety and proper gt sequencing with RW; pt demo decr safety awareness and knowledge of RW with ambulation   Stairs            Wheelchair Mobility    Modified Rankin (Stroke Patients Only)       Balance Overall balance assessment: History of Falls;Needs assistance Sitting-balance support: Feet supported Sitting balance-Leahy Scale: Good     Standing balance support: During functional activity;Bilateral upper extremity supported;No upper extremity supported Standing balance-Leahy Scale: Fair Standing balance comment: + sway without use of UE support                             Pertinent Vitals/Pain 7/10 Rt flank (ribs); patient repositioned for comfort     Home Living Family/patient expects to be discharged to:: Private residence Living Arrangements: Alone Available Help at Discharge: Other (Comment) (no family in area to (A)) Type of Home: Apartment Home Access: Stairs to enter Entrance Stairs-Rails: None Entrance Stairs-Number of Steps: 1 Home Layout: One level Home Equipment: Walker - 4 wheels;Shower seat      Prior Function Level of Independence: Independent with assistive device(s)         Comments: Pt used 2L 02 PTA.  She denies h/o falls     Hand Dominance   Dominant Hand: Right  Extremity/Trunk Assessment   Upper Extremity Assessment: Defer to OT evaluation RUE Deficits / Details: shoulder flexion to ~80* - limited by pain  RUE: Unable to fully assess due to pain       Lower Extremity Assessment: Generalized weakness      Cervical / Trunk Assessment: Normal  Communication    Communication: No difficulties  Cognition Arousal/Alertness: Awake/alert Behavior During Therapy: WFL for tasks assessed/performed Overall Cognitive Status: No family/caregiver present to determine baseline cognitive functioning Area of Impairment: Safety/judgement;Problem solving Orientation Level: Disoriented to;Time (confused as to the exact date and day.  )       Safety/Judgement: Decreased awareness of safety Awareness: Intellectual Problem Solving: Slow processing;Requires verbal cues;Requires tactile cues General Comments: pt with poor awareness throughout session about current deficits; she also began ambulating to bathroom without O2 hookup; questionable baseline ? no family present     General Comments General comments (skin integrity, edema, etc.): 02 sats 86% on 2L with activity    Exercises General Exercises - Lower Extremity Ankle Circles/Pumps: AROM;Strengthening;Both;10 reps;Seated Long Arc Quad: AROM;Strengthening;Both;10 reps;Seated      Assessment/Plan    PT Assessment Patient needs continued PT services  PT Diagnosis Abnormality of gait;Generalized weakness;Acute pain   PT Problem List Decreased strength;Decreased activity tolerance;Decreased balance;Decreased mobility;Decreased cognition;Decreased knowledge of use of DME;Decreased safety awareness;Pain;Cardiopulmonary status limiting activity  PT Treatment Interventions DME instruction;Gait training;Functional mobility training;Therapeutic activities;Therapeutic exercise;Balance training;Neuromuscular re-education;Patient/family education   PT Goals (Current goals can be found in the Care Plan section) Acute Rehab PT Goals Patient Stated Goal: to get one of these walkers PT Goal Formulation: With patient Time For Goal Achievement: 02/25/14 Potential to Achieve Goals: Good    Frequency Min 3X/week   Barriers to discharge Decreased caregiver support lives alone with little family support    Co-evaluation                End of Session Equipment Utilized During Treatment: Gait belt;Oxygen Activity Tolerance: Patient limited by fatigue Patient left: Other (comment) (wheelchair with RN for transport) Nurse Communication: Mobility status         Time: [phone] PT Time Calculation (min): 18 min   Charges:   PT Evaluation $Initial PT Evaluation Tier I: 1 Procedure PT Treatments $Gait Training: 8-22 mins   PT G CodesGustavus Bryant, Abram 02/21/2014, 3:09 PM

## 2014-02-21 NOTE — Progress Notes (Signed)
Seen, agree with above. Diet as tolerated.   Pulmonary toilet.

## 2014-02-21 NOTE — Progress Notes (Signed)
Patient ID: Dana Blackwell, female   DOB: 1928-05-11, 80 y.o.   MRN: [id number] Neuro stable, ct head of today shows sah but less dense. Ok to go to the floor

## 2014-02-22 DIAGNOSIS — J438 Other emphysema: Secondary | ICD-10-CM

## 2014-02-22 DIAGNOSIS — I2510 Atherosclerotic heart disease of native coronary artery without angina pectoris: Secondary | ICD-10-CM

## 2014-02-22 DIAGNOSIS — I379 Nonrheumatic pulmonary valve disorder, unspecified: Secondary | ICD-10-CM

## 2014-02-22 LAB — BASIC METABOLIC PANEL
Anion gap: 12 (ref 5–15)
BUN: 21 mg/dL (ref 6–23)
CALCIUM: 9 mg/dL (ref 8.4–10.5)
CO2: 28 mEq/L (ref 19–32)
Chloride: 99 mEq/L (ref 96–112)
Creatinine, Ser: 1.2 mg/dL — ABNORMAL HIGH (ref 0.50–1.10)
GFR, EST AFRICAN AMERICAN: 47 mL/min — AB (ref >90)
GFR, EST NON AFRICAN AMERICAN: 41 mL/min — AB (ref >90)
GLUCOSE: 178 mg/dL — AB (ref 70–99)
POTASSIUM: 4.5 meq/L (ref 3.7–5.3)
Sodium: 139 mEq/L (ref 137–147)

## 2014-02-22 LAB — CBC
HCT: 30.5 % — ABNORMAL LOW (ref 36.0–46.0)
HEMOGLOBIN: 9.2 g/dL — AB (ref 12.0–15.0)
MCH: 28 pg (ref 26.0–34.0)
MCHC: 30.2 g/dL (ref 30.0–36.0)
MCV: 93 fL (ref 78.0–100.0)
Platelets: 254 10*3/uL (ref 150–400)
RBC: 3.28 MIL/uL — ABNORMAL LOW (ref 3.87–5.11)
RDW: 14.7 % (ref 11.5–15.5)
WBC: 9.6 10*3/uL (ref 4.0–10.5)

## 2014-02-22 LAB — GLUCOSE, CAPILLARY
Glucose-Capillary: 188 mg/dL — ABNORMAL HIGH (ref 70–99)
Glucose-Capillary: 183 mg/dL — ABNORMAL HIGH (ref 70–99)
Glucose-Capillary: 185 mg/dL — ABNORMAL HIGH (ref 70–99)
Glucose-Capillary: 161 mg/dL — ABNORMAL HIGH (ref 70–99)

## 2014-02-22 MED ORDER — HYDRALAZINE HCL 20 MG/ML IJ SOLN
10.0000 mg | INTRAMUSCULAR | Status: DC | PRN
  Administered 2014-02-22 (×2): 10 mg via INTRAVENOUS
  Filled 2014-02-22 (×2): qty 1

## 2014-02-22 MED ORDER — CLONIDINE HCL 0.1 MG PO TABS
0.1000 mg | ORAL_TABLET | Freq: Two times a day (BID) | ORAL | Status: DC
  Administered 2014-02-22 – 2014-02-27 (×11): 0.1 mg via ORAL
  Filled 2014-02-22 (×11): qty 1

## 2014-02-22 MED ORDER — LISINOPRIL 10 MG PO TABS
10.0000 mg | ORAL_TABLET | Freq: Every day | ORAL | Status: DC
  Administered 2014-02-22 – 2014-02-24 (×3): 10 mg via ORAL
  Filled 2014-02-22 (×2): qty 1
  Filled 2014-02-22: qty 2

## 2014-02-22 NOTE — Progress Notes (Addendum)
Clinical Social Work Department CLINICAL SOCIAL WORK PLACEMENT NOTE 02/22/2014  Patient:  Dana Blackwell, Dana Blackwell  Account Number:  [id number] [place] date:  02/19/2014  Clinical Social Worker:  Blima Rich, Latanya Presser  Date/time:  02/22/2014 01:45 PM  Clinical Social Work is seeking post-discharge placement for this patient at the following level of care:   [place]   (*CSW will update this form in Epic as items are completed)   02/22/2014  Patient/family provided with [place] Department of Clinical Social Work's list of facilities offering this level of care within the geographic area requested by the patient (or if unable, by the patient's family).  02/22/2014  Patient/family informed of their freedom to choose among providers that offer the needed level of care, that participate in Medicare, Medicaid or managed care program needed by the patient, have an available bed and are willing to accept the patient.  02/22/2014  Patient/family informed of [facility]' ownership interest in [facility], as well as of the fact that they are under no obligation to receive care at this facility.  PASARR submitted to EDS on 02/22/2014 PASARR number received on 02/22/2014  FL2 transmitted to all facilities in geographic area requested by pt/family on  02/22/2014 FL2 transmitted to all facilities within larger geographic area on   Patient informed that his/her managed care company has contracts with or will negotiate with  certain facilities, including the following:     Patient/family informed of bed offers received:  02/23/2014 Patient chooses bed at [facility] Physician recommends and patient chooses bed at    Patient to be transferred to [facility] on  02/27/2014 Patient to be transferred to facility by [facility] Patient and family notified of transfer on 02/27/2014 Name of family member notified:  Pt (a&ox4) notified at bedside. Pt's primary contact Heron Nay, friend)  notified via phone.  The following physician request were entered in Epic:   Additional Comments:  Lubertha Sayres, MSW, [facility] Licensed Clinical Social Worker [phone] and [phone]

## 2014-02-22 NOTE — Progress Notes (Signed)
Patient ID: Dana Blackwell, female   DOB: [birth year], 80 y.o.   MRN: [id number]   LOS: 3 days   Subjective: Had a bad night with refractory HTN.   Objective: Vital signs in last 24 hours: Temp:  [97.6 F (36.4 C)-98.9 F (37.2 C)] 98.9 F (37.2 C) (07/05 0458) Pulse Rate:  [52-70] 67 (07/05 0811) Resp:  [16-19] 19 (07/05 0458) BP: (150-210)/(52-69) 210/67 mmHg (07/05 0811) SpO2:  [95 %-100 %] 96 % (07/05 0458) Last BM Date: 02/19/14   IS: 532ml (=)   Laboratory  CBC  Recent Labs  02/21/14 0240 02/22/14 0442  WBC 10.0 9.6  HGB 8.6* 9.2*  HCT 28.0* 30.5*  PLT 254 254   BMET  Recent Labs  02/21/14 0240 02/22/14 0442  NA 140 139  K 5.0 4.5  CL 102 99  CO2 27 28  GLUCOSE 154* 178*  BUN 22 21  CREATININE 1.11* 1.20*  CALCIUM 8.7 9.0    Physical Exam General appearance: alert and no distress Resp: clear to auscultation bilaterally Cardio: regular rate and rhythm   Assessment/Plan: Fall -- Etiology is uncertain with unwitnessed fall. Mechanical vs syncopal. Leave to primary service.  TBI w/bilateral diffuse SAH, R>L -- Repeat head CT unchanged  Right first rib fx -- Aggressive pulmonary toilet. Would try to avoid narcotics if at all possible. PT/OT  Multiple medical problems -- per primary service    Lisette Abu, PA-C Pager: [phone] General Trauma PA Pager: [phone]  02/22/2014

## 2014-02-22 NOTE — Progress Notes (Signed)
Clinical Social Work Department BRIEF PSYCHOSOCIAL ASSESSMENT 02/22/2014  Patient:  Dana Blackwell, Dana Blackwell     Account Number:  [id number]     Admit date:  02/19/2014  Clinical Social Worker:  Rolinda Roan  Date/Time:  02/22/2014 01:39 PM  Referred by:  Physician  Date Referred:  02/22/2014 Referred for  SNF Placement   Other Referral:   Interview type:  Patient Other interview type:    PSYCHOSOCIAL DATA Living Status:  ALONE Admitted from facility:   Level of care:   Primary support name:  Daughter Primary support relationship to patient:  CHILD, ADULT Degree of support available:   Good support per patient.    CURRENT CONCERNS  Other Concerns:    SOCIAL WORK ASSESSMENT / PLAN Clinical Social Worker (CSW) met with patient to discuss SNF placement. Patient reported that she lives alone in [place]. Patient stated she has 2 sons in [place] and one of them had a severe stroke. Patient reported that she has a daughter in [place]. Patient is agreeable to SNF placement for rehab. Patient does not have a preference  at this point. CSW encouraged patient to discuss rehab options with friends and family. Patient verbalized her understanding.   Assessment/plan status:  Psychosocial Support/Ongoing Assessment of Needs Other assessment/ plan:   Information/referral to community resources:   CSW gave patient SNF list.    PATIENT'S/FAMILY'S RESPONSE TO PLAN OF CARE: Patient thanked CSW for visit and assisting with placement process.

## 2014-02-22 NOTE — Progress Notes (Signed)
Occupational Therapy Treatment Patient Details Name: Dana Blackwell MRN: [id number] DOB: [birth year] Today's Date: 02/22/2014    History of present illness 80 yo female with CAD, HTN, CVA, DM, COPD presents to [facility] ED 7/2 s/p mechanical fall. In ED found to have acute SAH. CT of head showed Moderate to advanced subarachnoid hemorrhage including significant blood in the basilar cisterns.    OT comments  Pt seen today for ADL session and functional mobility. RN reports that pt has not been up today and has been requesting to use bed pan. OT educated pt on importance of getting OOB for functional mobility, circulation, respiration, and strengthening of LEs and UEs. Encouraged pt to use BSC rather than bed pan. Pt required min (A) for transfer to [facility] and is limited by pain this date. Pt continues to have deficits in safety awareness and inability to anticipate safety concerns if she were to go home. Pt would continue to benefit from skilled OT to increase safety and independence with ADLs.    Follow Up Recommendations  SNF;Supervision/Assistance - 24 hour    Equipment Recommendations  Other (comment) (Defer to SNF)       Precautions / Restrictions Precautions Precautions: Fall Precaution Comments: pt on 2L O2 at home Restrictions Weight Bearing Restrictions: No       Mobility Bed Mobility Overal bed mobility: Needs Assistance Bed Mobility: Rolling;Sidelying to Sit Rolling: Mod assist Sidelying to sit: Mod assist       General bed mobility comments: Pt requires VC's for sequencing and technique. Pt requires physical assist to roll and for truncal support off bed.   Transfers Overall transfer level: Needs assistance Equipment used: Rolling walker (2 wheeled) Transfers: Sit to/from Stand Sit to Stand: Min assist         General transfer comment: min (A) to power up from surface and vc's for sequencing and safety.     Balance Overall balance assessment: History of  Falls;Needs assistance Sitting-balance support: Feet supported;No upper extremity supported Sitting balance-Leahy Scale: Good     Standing balance support: Bilateral upper extremity supported;During functional activity Standing balance-Leahy Scale: Fair                     ADL Overall ADL's : Needs assistance/impaired                     Lower Body Dressing: Total assistance;Sit to/from stand Lower Body Dressing Details (indicate cue type and reason): limited by pain  Toilet Transfer: Min guard;Stand-pivot;BSC;RW   Toileting- Clothing Manipulation and Hygiene: Minimal assistance;Sit to/from stand       Functional mobility during ADLs: Min guard;Rolling walker General ADL Comments: Pt continues to be limited by pain and unable to reach Bil feet to don/doff socks. Pt demonstrated some impulsivity due to urinary urgency. When asked what would happen at home if pt had urinary urgency, she responded that "it wouldn't happen at home."                Cognition  Arousal/Alertness: Awake/Alert Behavior During Therapy: [facility] for tasks assessed/performed Overall Cognitive Status: No family/caregiver present to determine baseline cognitive functioning Area of Impairment: Safety/judgement;Awareness;Problem solving          Safety/Judgement: Decreased awareness of safety Awareness: Emergent Problem Solving: Slow processing;Requires verbal cues;Requires tactile cues General Comments: Pt has poor safety awareness and decreased awareness of her deficits and their impact on ADLs. According to RN, pt has been requesting to use bed pan  all day and has not been up as she c/o pain and soreness.                  Pertinent Vitals/ Pain       Pt did not report pain when asked, however did yell out in pain during bed mobility. Repositioned pt in recliner for max comfort.          Frequency Min 3X/week     Progress Toward Goals  OT Goals(current goals can now be found in  the care plan section)  Progress towards OT goals: Progressing toward goals  Acute Rehab OT Goals Patient Stated Goal: to get home ADL Goals Pt Will Perform Grooming: with supervision;standing Pt Will Perform Upper Body Bathing: with set-up;with supervision;sitting Pt Will Perform Lower Body Bathing: with supervision;sit to/from stand Pt Will Perform Upper Body Dressing: with supervision;sitting Pt Will Perform Lower Body Dressing: with supervision;sit to/from stand;with adaptive equipment Pt Will Transfer to Toilet: with supervision;ambulating;regular height toilet;grab bars Pt Will Perform Toileting - Clothing Manipulation and hygiene: with supervision;sit to/from stand Pt Will Perform Tub/Shower Transfer: Tub transfer;with supervision;shower seat;rolling walker;grab bars  Plan Discharge plan remains appropriate       End of Session Equipment Utilized During Treatment: Rolling walker;Gait belt;Oxygen   Activity Tolerance Patient limited by pain   Patient Left in chair;with call bell/phone within reach;with chair alarm set   Nurse Communication Mobility status        Time: 1455-1530 OT Time Calculation (min): 35 min  Charges: OT General Charges $OT Visit: 1 Procedure OT Treatments $Self Care/Home Management : 23-37 mins  Juluis Rainier [phone] 02/22/2014, 5:35 PM

## 2014-02-22 NOTE — Progress Notes (Addendum)
[facility] PROGRESS NOTE  Dana Blackwell [name] DOB: [birth year] DOA: 02/19/2014 PCP: Dana Gravel, MD  PCCM transfer to [place] today 7/5  Assessment/Plan: 1. Sub arachnoid hemorrhage-Moderate to Large based on Admission CT -following fall vs SYncope -CTA no aneurysm noted -Neurosurgery following, repeat CT stable, conservative mgt -ASA/plavix on hold  2. CAD -recent PCI and stenting of LAD with DES x2 in 4/15 -plavix and ASA on hold due to 1, will notify Cardiology -Continue metoprolol/statin  3. Syncope vs Fall -keep on tele -check ECHO  4. DM -continue lantus, SSI  5. HTN -uncontrolled since last pm -add clonidine, increase lisinopril -continue imdur, metoprolol -hydralazine PRN  6. GERD -continue PPI  7. Hypothyroidism -continue synthroid  8. COPD -stable, on Home O2  DVT proph: SCDs  Code Status: Full Code Family Communication: none at bedside Disposition Plan: SNF recommended by PT/Ot   Consultants:  Neurosurgery  HPI/Subjective: Didn't sleep since BP up last night  Objective: Filed Vitals:   02/22/14 0811  BP: 210/67  Pulse: 67  Temp:   Resp:     Intake/Output Summary (Last 24 hours) at 02/22/14 0953 Last data filed at 02/22/14 0653  Gross per 24 hour  Intake    600 ml  Output    800 ml  Net   -200 ml   Filed Weights   02/19/14 1426 02/20/14 0500  Weight: 66.679 kg (147 lb) 66.6 kg (146 lb 13.2 oz)    Exam:   General:  AAOx3, no distress  Cardiovascular: S1S2/RRR  Respiratory: CTAB  Abdomen: soft, NT, BS present  Musculoskeletal: no edema c/c  NEuro: non focal   Data Reviewed: Basic Metabolic Panel:  Recent Labs Lab 02/19/14 1505 02/20/14 0250 02/21/14 0240 02/22/14 0442  NA 137 137 140 139  K 4.8 5.4* 5.0 4.5  CL 98 100 102 99  CO2 27 28 27 28   GLUCOSE 175* 231* 154* 178*  BUN 23 19 22 21   CREATININE 1.20* 1.14* 1.11* 1.20*  CALCIUM 8.9 8.6 8.7 9.0   Liver Function Tests: No results  found for this basename: AST, ALT, ALKPHOS, BILITOT, PROT, ALBUMIN,  in the last 168 hours No results found for this basename: LIPASE, AMYLASE,  in the last 168 hours No results found for this basename: AMMONIA,  in the last 168 hours CBC:  Recent Labs Lab 02/19/14 1505 02/20/14 0250 02/21/14 0240 02/22/14 0442  WBC 7.7 9.5 10.0 9.6  NEUTROABS 5.5  --   --   --   HGB 9.9* 8.6* 8.6* 9.2*  HCT 32.6* 27.7* 28.0* 30.5*  MCV 92.6 92.3 92.7 93.0  PLT 279 265 254 254   Cardiac Enzymes:  Recent Labs Lab 02/19/14 1505  TROPONINI <0.30   BNP (last 3 results) No results found for this basename: PROBNP,  in the last 8760 hours CBG:  Recent Labs Lab 02/21/14 0744 02/21/14 1200 02/21/14 1651 02/21/14 2142 02/22/14 0645  GLUCAP 140* 165* 184* 151* 188*    Recent Results (from the past 240 hour(s))  URINE CULTURE     Status: None   Collection Time    02/19/14  3:23 PM      Result Value Ref Range Status   Specimen Description URINE, CLEAN CATCH   Final   Special Requests NONE   Final   Culture  Setup Time     Final   Value: 02/19/2014 15:52     Performed at [facility]     Final  Value: 15,000 COLONIES/ML     Performed at [facility]   Culture     Final   Value: LACTOBACILLUS SPECIES     Note: Standardized susceptibility testing for this organism is not available.     Performed at [facility]   Report Status 02/20/2014 FINAL   Final  MRSA PCR SCREENING     Status: None   Collection Time    02/19/14  9:20 PM      Result Value Ref Range Status   MRSA by PCR NEGATIVE  NEGATIVE Final   Comment:            The GeneXpert MRSA Assay (FDA     approved for NASAL specimens     only), is one component of a     comprehensive MRSA colonization     surveillance program. It is not     intended to diagnose MRSA     infection nor to guide or     monitor treatment for     MRSA infections.     Studies: No results found.  Scheduled  Meds: . atorvastatin  20 mg Oral q1800  . cloNIDine  0.1 mg Oral BID  . insulin aspart  0-15 Units Subcutaneous TID WC  . insulin aspart  0-5 Units Subcutaneous QHS  . insulin glargine  10 Units Subcutaneous QHS  . isosorbide mononitrate  30 mg Oral Daily  . levothyroxine  75 mcg Oral QAC breakfast  . lidocaine  1 patch Transdermal Q24H  . lisinopril  5 mg Oral Daily  . metoprolol succinate  100 mg Oral Daily  . pantoprazole  40 mg Oral Daily  . sertraline  50 mg Oral Daily   Continuous Infusions:  Antibiotics Given (last 72 hours)   None      Active Problems:   DM, TYPE II   CAD 3V at cath 2006- medical Rx   Traumatic SAH (small)   Syncope    Time spent: 61min    Dana Blackwell  [facility] Pager [phone]. If 7PM-7AM, please contact night-coverage at [email or web address], password [facility] 02/22/2014, 9:53 AM  LOS: 3 days

## 2014-02-22 NOTE — Progress Notes (Signed)
  Echocardiogram 2D Echocardiogram has been performed.  Dana Blackwell 02/22/2014, 2:59 PM

## 2014-02-22 NOTE — Progress Notes (Signed)
Patient ID: Dana Blackwell, female   DOB: 1928-04-08, 80 y.o.   MRN: [id number] Stable, decrease of headache.had stents 2 months ago . Needs anticoagulation. Ct head in am

## 2014-02-22 NOTE — Progress Notes (Signed)
Patient stable from trauma standpoint.  This patient has been seen and I agree with the findings and treatment plan.  Kathryne Eriksson. Dahlia Bailiff, MD, [place] [phone] (pager) [phone] (direct pager) Trauma Surgeon

## 2014-02-22 NOTE — Consult Note (Signed)
Admit date: 02/19/2014 Referring Physician  Dr. Broadus John Primary Physician [name], MD Primary Cardiologist  Dr. Burt Knack Reason for Consultation:  question about antiplatelet therapy in the setting of subarachnoid hemorrhage and recent drug eluting stent placement  HPI: 80 year old patient who underwent rotational atherectomy of her LAD with 2 DES placement on 4/14 admitted on 02/19/14 after a fall, mechanical resulting in acute subarachnoid hemorrhage as well as fracture of her first rib. Neurosurgery has been following with conservative management. Repeat head CT has been stable. Both aspirin and Plavix have been on hold since admission.    PMH:   Past Medical History  Diagnosis Date  . Coronary artery disease     11/2012: s/p 3.0 x 28 mm Xience and 2.5 x 28 mm Xience stents to the LAD    . Hypertension   . Hyperlipidemia   . Stroke     hx of  . Obesity   . Heart murmur   . Diabetes mellitus     type II  . Hypothyroidism   . Esophageal stricture   . Gastritis   . Nausea and vomiting   . Diarrhea   . Abdominal pain   . Esophageal reflux   . Depression   . Uterine cancer   . H/O: hysterectomy     PSH:   Past Surgical History  Procedure Laterality Date  . Bladder surgery      tacking  . Cervical disc surgery    . Cholecystectomy    . Total abdominal hysterectomy     Allergies:  Tetanus toxoid Prior to Admit Meds:   Prior to Admission medications   Medication Sig Start Date End Date Taking? Authorizing Provider  acetaminophen (TYLENOL) 650 MG CR tablet Take 650 mg by mouth daily.    Yes Historical Provider, MD  aspirin 81 MG EC tablet Take 1 tablet (81 mg total) by mouth daily. 11/28/13  Yes Rhonda G Barrett, PA-C  atorvastatin (LIPITOR) 20 MG tablet Take 20 mg by mouth daily at 6 PM.    Yes Historical Provider, MD  beta carotene w/minerals (OCUVITE) tablet Take 1 tablet by mouth at bedtime.    Yes Historical Provider, MD  calcium carbonate (OS-CAL) 600 MG TABS  tablet Take 600 mg by mouth daily.   Yes Historical Provider, MD  clopidogrel (PLAVIX) 75 MG tablet Take 1 tablet (75 mg total) by mouth daily with breakfast. 11/28/13  Yes Rhonda G Barrett, PA-C  Cyanocobalamin (VITAMIN B-12 PO) Take 1 tablet by mouth daily.   Yes Historical Provider, MD  fish oil-omega-3 fatty acids 1000 MG capsule Take 1 g by mouth daily.    Yes Historical Provider, MD  folic acid-pyridoxine-cyancobalamin (FOLTX) 2.5-25-2 MG TABS Take 1 tablet by mouth daily.    Yes Historical Provider, MD  furosemide (LASIX) 20 MG tablet Take 20 mg by mouth daily.   Yes Historical Provider, MD  hydrocodone-acetaminophen (LORCET-HD) 5-500 MG per capsule Take 1 capsule by mouth daily.    Yes Historical Provider, MD  insulin glargine (LANTUS) 100 UNIT/ML injection Inject 0.1 mLs (10 Units total) into the skin at bedtime. 11/28/13  Yes Rhonda G Barrett, PA-C  insulin lispro (HUMALOG) 100 UNIT/ML injection Inject 4-5 Units into the skin 3 (three) times daily before meals.   Yes Historical Provider, MD  isosorbide mononitrate (IMDUR) 30 MG 24 hr tablet Take 1 tablet (30 mg total) by mouth daily. 11/28/13  Yes Rhonda G Barrett, PA-C  levothyroxine (SYNTHROID, LEVOTHROID) 75 MCG tablet Take  75 mcg by mouth daily.     Yes Historical Provider, MD  lisinopril (PRINIVIL,ZESTRIL) 5 MG tablet Take 1 tablet (5 mg total) by mouth daily. 11/28/13  Yes Rhonda G Barrett, PA-C  MAGNESIUM PO Take 1 tablet by mouth at bedtime.    Yes Historical Provider, MD  metoprolol (TOPROL-XL) 100 MG 24 hr tablet Take 100 mg by mouth every morning.    Yes Historical Provider, MD  pantoprazole (PROTONIX) 40 MG tablet Take 40 mg by mouth daily.     Yes Historical Provider, MD  predniSONE (DELTASONE) 5 MG tablet Take 5 mg by mouth daily with breakfast.   Yes Historical Provider, MD  sertraline (ZOLOFT) 100 MG tablet Take 50 mg by mouth daily.    Yes Historical Provider, MD    . atorvastatin  20 mg Oral q1800  . cloNIDine  0.1 mg Oral  BID  . insulin aspart  0-15 Units Subcutaneous TID WC  . insulin aspart  0-5 Units Subcutaneous QHS  . insulin glargine  10 Units Subcutaneous QHS  . isosorbide mononitrate  30 mg Oral Daily  . levothyroxine  75 mcg Oral QAC breakfast  . lidocaine  1 patch Transdermal Q24H  . lisinopril  10 mg Oral Daily  . metoprolol succinate  100 mg Oral Daily  . pantoprazole  40 mg Oral Daily  . sertraline  50 mg Oral Daily   Fam HX:    Family History  Problem Relation Age of Onset  . Heart attack Mother 47    died  . Coronary artery disease      family hx on  father side   Social HX:    History   Social History  . Marital Status: Divorced    Spouse Name: N/A    Number of Children: N/A  . Years of Education: N/A   Occupational History  . retired    Social History Main Topics  . Smoking status: Never Smoker   . Smokeless tobacco: Not on file  . Alcohol Use: No  . Drug Use: No  . Sexual Activity: Not on file   Other Topics Concern  . Not on file   Social History Narrative  . No narrative on file     ROS:  No focal deficits, no shortness of breath, no prior history of syncope, no orthopnea, no PND, no rashes. All 11 ROS were addressed and are negative except what is stated in the HPI   Physical Exam: Blood pressure 154/52, pulse 60, temperature 98 F (36.7 C), temperature source Oral, resp. rate 17, height 5' (1.524 m), weight 146 lb 13.2 oz (66.6 kg), SpO2 96.00%.   General: Well developed, elderly well nourished, in no acute distress Head: Eyes PERRLA, No xanthomas.   Normal cephalic and atramatic  Lungs:   Clear bilaterally to auscultation and percussion. Normal respiratory effort. No wheezes, no rales. Heart:   HRRR S1 S2 Pulses are 2+ & equal. No murmur, rubs, gallops.  No carotid bruit. No JVD.  No abdominal bruits.  Abdomen: Bowel sounds are positive, abdomen soft and non-tender without masses. No hepatosplenomegaly. Msk:  Back normal. Normal strength and tone for  age. Extremities:  No clubbing, cyanosis or edema.  DP +1 Neuro: Alert and oriented X 3, non-focal, MAE x 4 GU: Deferred Rectal: Deferred Psych:  Good affect, responds appropriately      Labs: Lab Results  Component Value Date   WBC 9.6 02/22/2014   HGB 9.2* 02/22/2014   HCT 30.5*  02/22/2014   MCV 93.0 02/22/2014   PLT 254 02/22/2014     Recent Labs Lab 02/22/14 0442  NA 139  K 4.5  CL 99  CO2 28  BUN 21  CREATININE 1.20*  CALCIUM 9.0  GLUCOSE 178*    Recent Labs  02/19/14 1505  TROPONINI <0.30   Lab Results  Component Value Date   CHOL  Value: 138        ATP III CLASSIFICATION:  <200     mg/dL   Desirable  200-239  mg/dL   Borderline High  >=240    mg/dL   High        05/28/2010   HDL 40 05/28/2010   LDLCALC  Value: 62        Total Cholesterol/HDL:CHD Risk Coronary Heart Disease Risk Table                     Men   Women  1/2 Average Risk   3.4   3.3  Average Risk       5.0   4.4  2 X Average Risk   9.6   7.1  3 X Average Risk  23.4   11.0        Use the calculated Patient Ratio above and the CHD Risk Table to determine the patient's CHD Risk.        ATP III CLASSIFICATION (LDL):  <100     mg/dL   Optimal  100-129  mg/dL   Near or Above                    Optimal  130-159  mg/dL   Borderline  160-189  mg/dL   High  >190     mg/dL   Very High 05/28/2010   TRIG 179* 05/28/2010   Lab Results  Component Value Date   DDIMER  Value: 1.38        AT THE INHOUSE ESTABLISHED CUTOFF VALUE OF 0.48 ug/mL FEU, THIS ASSAY HAS BEEN DOCUMENTED IN THE LITERATURE TO HAVE A SENSITIVITY AND NEGATIVE PREDICTIVE VALUE OF AT LEAST 98 TO 99%.  THE TEST RESULT SHOULD BE CORRELATED WITH AN ASSESSMENT OF THE CLINICAL PROBABILITY OF DVT / VTE.* 05/27/2010     Radiology: Repeat CT 02/20/14, showed stable volume of subarachnoid hemorrhage.  No results found. Personally viewed.  EKG:  02/19/14 Sinus rhythm, nonspecific ST changes Personally viewed.   ASSESSMENT/PLAN:    80 year old female with coronary artery  disease status post rotational atherectomy of LAD with 2 drug-eluting stent placement in April of 2015 who suffered a mechanical fall and subarachnoid hemorrhage.  1) I agree with neurosurgery that she needs antiplatelet therapy especially given the temporal nature of her stent placement. Please restart aspirin 81 mg and Plavix when felt comfortable from a neurosurgical perspective. Obviously, the risks and benefits of the timing the restart of antiplatelet therapy must be heavily weighed in the light of recent subarachnoid hemorrhage. Without dual antiplatelet therapy, she is at increased risk for stent thrombosis. She understands this.  2. Coronary artery disease-as described above. Stable, no symptoms.  3. Fall-likely mechanical fall but unknown. She was taking out the garbage with her walker and turned around, felt a sensation like she was unable to take in a deep breath she thinks that was the last she remembers. Questionable arrhythmia. I would recommended event monitor on discharge.  Candee Furbish, MD  02/22/2014  1:55 PM

## 2014-02-23 ENCOUNTER — Inpatient Hospital Stay (HOSPITAL_COMMUNITY): Payer: Medicare HMO

## 2014-02-23 ENCOUNTER — Encounter (HOSPITAL_COMMUNITY): Payer: Medicare HMO

## 2014-02-23 DIAGNOSIS — I272 Pulmonary hypertension, unspecified: Secondary | ICD-10-CM | POA: Diagnosis present

## 2014-02-23 DIAGNOSIS — I2789 Other specified pulmonary heart diseases: Secondary | ICD-10-CM

## 2014-02-23 LAB — GLUCOSE, CAPILLARY
GLUCOSE-CAPILLARY: 119 mg/dL — AB (ref 70–99)
GLUCOSE-CAPILLARY: 186 mg/dL — AB (ref 70–99)
Glucose-Capillary: 165 mg/dL — ABNORMAL HIGH (ref 70–99)
Glucose-Capillary: 206 mg/dL — ABNORMAL HIGH (ref 70–99)

## 2014-02-23 LAB — D-DIMER, QUANTITATIVE: D-Dimer, Quant: 1.69 ug/mL-FEU — ABNORMAL HIGH (ref 0.00–0.48)

## 2014-02-23 NOTE — Progress Notes (Signed)
[facility] PROGRESS NOTE  Dana Blackwell LKG:[id number] DOB: 05-23-1928 DOA: 02/19/2014 PCP: Dana Gravel, MD  PCCM transfer to [place] today 7/5  Assessment/Plan: 1. Sub arachnoid hemorrhage-Moderate to Large based on Admission CT -following fall vs SYncope -CTA no aneurysm noted -Neurosurgery following, repeat CT stable, conservative mgt -ASA/plavix on hold, per Neurosurgery  (D/w Dr.Botero 7/5 and Dr.Kritzer this am) if repeat CT head stable ok with resuming antiplatelet agents   2. CAD -recent PCI and stenting of LAD with DES x2 in 4/15 -plavix and ASA on hold due to 1, Cards consult appreciated -Continue metoprolol/statin  3. Syncope vs Fall -keep on tele -check ECHO  4. DM -continue lantus, SSI  5. HTN -improved but still high at times -continue  clonidine, lisinopril -continue imdur, metoprolol -hydralazine PRN  6. GERD -continue PPI  7. Hypothyroidism -continue synthroid  8. COPD -stable, on Home O2  9. Right first rib fx  -incentive spirometry, supportive care  DVT proph: SCDs  Code Status: Full Code Family Communication: none at bedside Disposition Plan: SNF recommended by PT/Ot   Consultants:  Neurosurgery  HPI/Subjective: Feels weak  Objective: Filed Vitals:   02/23/14 0503  BP: 186/54  Pulse: 58  Temp: 97.9 F (36.6 C)  Resp: 20    Intake/Output Summary (Last 24 hours) at 02/23/14 0845 Last data filed at 02/22/14 0900  Gross per 24 hour  Intake    360 ml  Output    300 ml  Net     60 ml   Filed Weights   02/19/14 1426 02/20/14 0500  Weight: 66.679 kg (147 lb) 66.6 kg (146 lb 13.2 oz)    Exam:   General:  AAOx3, no distress  Cardiovascular: S1S2/RRR  Respiratory: CTAB  Abdomen: soft, NT, BS present  Musculoskeletal: no edema c/c  NEuro: non focal   Data Reviewed: Basic Metabolic Panel:  Recent Labs Lab 02/19/14 1505 02/20/14 0250 02/21/14 0240 02/22/14 0442  NA 137 137 140 139  K 4.8 5.4* 5.0  4.5  CL 98 100 102 99  CO2 27 28 27 28   GLUCOSE 175* 231* 154* 178*  BUN 23 19 22 21   CREATININE 1.20* 1.14* 1.11* 1.20*  CALCIUM 8.9 8.6 8.7 9.0   Liver Function Tests: No results found for this basename: AST, ALT, ALKPHOS, BILITOT, PROT, ALBUMIN,  in the last 168 hours No results found for this basename: LIPASE, AMYLASE,  in the last 168 hours No results found for this basename: AMMONIA,  in the last 168 hours CBC:  Recent Labs Lab 02/19/14 1505 02/20/14 0250 02/21/14 0240 02/22/14 0442  WBC 7.7 9.5 10.0 9.6  NEUTROABS 5.5  --   --   --   HGB 9.9* 8.6* 8.6* 9.2*  HCT 32.6* 27.7* 28.0* 30.5*  MCV 92.6 92.3 92.7 93.0  PLT 279 265 254 254   Cardiac Enzymes:  Recent Labs Lab 02/19/14 1505  TROPONINI <0.30   BNP (last 3 results) No results found for this basename: PROBNP,  in the last 8760 hours CBG:  Recent Labs Lab 02/22/14 0645 02/22/14 1133 02/22/14 1629 02/22/14 2032 02/23/14 0646  GLUCAP 188* 183* 185* 161* 119*    Recent Results (from the past 240 hour(s))  URINE CULTURE     Status: None   Collection Time    02/19/14  3:23 PM      Result Value Ref Range Status   Specimen Description URINE, CLEAN CATCH   Final   Special Requests NONE   Final  Culture  Setup Time     Final   Value: 02/19/2014 15:52     Performed at [facility]     Final   Value: 15,000 COLONIES/ML     Performed at [facility]   Culture     Final   Value: LACTOBACILLUS SPECIES     Note: Standardized susceptibility testing for this organism is not available.     Performed at [facility]   Report Status 02/20/2014 FINAL   Final  MRSA PCR SCREENING     Status: None   Collection Time    02/19/14  9:20 PM      Result Value Ref Range Status   MRSA by PCR NEGATIVE  NEGATIVE Final   Comment:            The GeneXpert MRSA Assay (FDA     approved for NASAL specimens     only), is one component of a     comprehensive MRSA colonization      surveillance program. It is not     intended to diagnose MRSA     infection nor to guide or     monitor treatment for     MRSA infections.     Studies: No results found.  Scheduled Meds: . atorvastatin  20 mg Oral q1800  . cloNIDine  0.1 mg Oral BID  . insulin aspart  0-15 Units Subcutaneous TID WC  . insulin aspart  0-5 Units Subcutaneous QHS  . insulin glargine  10 Units Subcutaneous QHS  . isosorbide mononitrate  30 mg Oral Daily  . levothyroxine  75 mcg Oral QAC breakfast  . lidocaine  1 patch Transdermal Q24H  . lisinopril  10 mg Oral Daily  . metoprolol succinate  100 mg Oral Daily  . pantoprazole  40 mg Oral Daily  . sertraline  50 mg Oral Daily   Continuous Infusions:  Antibiotics Given (last 72 hours)   None      Active Problems:   DM, TYPE II   CAD 3V at cath 2006- medical Rx   Traumatic SAH (small)   Syncope    Time spent: 41min    Dana Blackwell  [facility] Pager [phone]. If 7PM-7AM, please contact night-coverage at [email or web address], password [facility] 02/23/2014, 8:45 AM  LOS: 4 days

## 2014-02-23 NOTE — Progress Notes (Signed)
Patient ID: Dana Blackwell, female   DOB: 1928/08/07, 80 y.o.   MRN: [id number] Afeb, vss No new neuro issues. Remains awake and alert. For repeat CT next day or so. If that looks good should be ready for d/c if she feels she is able.

## 2014-02-23 NOTE — Progress Notes (Signed)
Physical Therapy Treatment Patient Details Name: Dana Blackwell MRN: [id number] DOB: 1928-02-14 Today's Date: 02/23/2014    History of Present Illness 80 yo female with CAD, HTN, CVA, DM, COPD presents to [facility] ED 7/2 s/p mechanical fall. In ED found to have acute SAH. CT of head showed Moderate to advanced subarachnoid hemorrhage including significant blood in the basilar cisterns.     PT Comments    Pt with improved ambulation endurance however requires assist for bed mobility and is unable to perform ADLs due to R flank pain. Pt unsafe to return home alone as she is unable to care for herself at this time and is at an increased falls risk. Pt to benefit from ST-SNF to achieve safe mod I function for safe transition home.  Follow Up Recommendations  SNF;Supervision/Assistance - 24 hour     Equipment Recommendations  Rolling walker with 5" wheels    Recommendations for Other Services       Precautions / Restrictions Precautions Precautions: Fall Precaution Comments: pt on 2L O2 at home Restrictions Weight Bearing Restrictions: No    Mobility  Bed Mobility Overal bed mobility: Needs Assistance Bed Mobility: Rolling;Sidelying to Sit Rolling: Mod assist Sidelying to sit: Mod assist       General bed mobility comments: pt with R flank soreness requiring assist for rolling and trunk elevation to come to sitting EOB  Transfers Overall transfer level: Needs assistance Equipment used: Rolling walker (2 wheeled) Transfers: Sit to/from [facility] Sit to Stand: Min assist Stand pivot transfers: Min assist       General transfer comment: v/c's for safe hand placement  Ambulation/Gait Ambulation/Gait assistance: Min assist Ambulation Distance (Feet): 120 Feet Assistive device: Rolling walker (2 wheeled) Gait Pattern/deviations: Step-through pattern;Decreased stride length;Shuffle;Trunk flexed Gait velocity: very decreased Gait velocity interpretation:  <1.8 ft/sec, indicative of risk for recurrent falls General Gait Details: +SOB, freq standing rest breaks, requires use of RW to maintain balance   Stairs            Wheelchair Mobility    Modified Rankin (Stroke Patients Only)       Balance           Standing balance support: Bilateral upper extremity supported Standing balance-Leahy Scale: Fair                      Cognition Arousal/Alertness: Awake/alert Behavior During Therapy: WFL for tasks assessed/performed Overall Cognitive Status: Within Functional Limits for tasks assessed                      Exercises      General Comments General comments (skin integrity, edema, etc.): pt with request to use bathroom. Pt std pvt to [facility] with minA, pt with urinary incontence on the way to the commode. Pt then required assist to perform hygiene due to pt unable to reach and perform task safely      Pertinent Vitals/Pain R flank pain, pt did not rate    Home Living                      Prior Function            PT Goals (current goals can now be found in the care plan section) Progress towards PT goals: Progressing toward goals    Frequency  Min 3X/week    PT Plan Current plan remains appropriate    Co-evaluation  End of Session Equipment Utilized During Treatment: Gait belt;Oxygen Activity Tolerance: Patient limited by fatigue       Time: 4680-3212 PT Time Calculation (min): 31 min  Charges:  $Gait Training: 8-22 mins $Therapeutic Activity: 8-22 mins                    G Codes:      Kingsley Callander 02/23/2014, 4:40 PM  Kittie Plater, PT, DPT Pager #: [phone] Office #: [phone]

## 2014-02-23 NOTE — Clinical Social Work Note (Signed)
CSW attempted to meet with pt at bedside to provide SNF bed offers. Per RN, pt currently at CT. CSW to continue to follow and check back in later in the afternoon.  Lubertha Sayres, MSW, [facility] Licensed Clinical Social Worker [phone] and [phone]

## 2014-02-23 NOTE — Progress Notes (Signed)
CARE MANAGEMENT NOTE 02/23/2014  Patient:  Dana Blackwell, Dana Blackwell   Account Number:  [id number]  Date Initiated:  02/23/2014  Documentation initiated by:  Olga Coaster  Subjective/Objective Assessment:   ADMITTED WITH SAH     Action/Plan:   CM FOLLOWING FOR DCP   Anticipated DC Date:  02/26/2014   Anticipated DC Plan:  SKILLED NURSING FACILITY  In-house referral  Clinical Social Worker      DC Planning Services  CM consult          Status of service:  In process, will continue to follow Medicare Important Message given?  YES (If response is "NO", the following Medicare IM given date fields will be blank) Date Medicare IM given:  02/23/2014 Medicare IM given by:  B Jayde Mcallister Date Additional Medicare IM given:   Additional Medicare IM given by:    Discharge Disposition:  [place]  Per UR Regulation:  Reviewed for med. necessity/level of care/duration of stay  If discussed at [place] of Stay Meetings, dates discussed:    Comments:  02/23/2014- B Damiah Mcdonald RN,BSN,MHA

## 2014-02-23 NOTE — Progress Notes (Signed)
SUBJECTIVE:  No complaints  OBJECTIVE:   Vitals:   Filed Vitals:   02/22/14 2035 02/23/14 0134 02/23/14 0503 02/23/14 1023  BP: 177/80 154/57 186/54 176/66  Pulse: 61 60 58 60  Temp: 98.4 F (36.9 C) 98 F (36.7 C) 97.9 F (36.6 C) 98.3 F (36.8 C)  TempSrc: Oral Oral Oral Oral  Resp: 18 18 20 20   Height:      Weight:      SpO2: 92% 97% 99% 97%   I&O's:  No intake or output data in the 24 hours ending 02/23/14 1137 TELEMETRY: Reviewed telemetry pt in NSR:     PHYSICAL EXAM General: Well developed, well nourished, in no acute distress Head: Eyes PERRLA, No xanthomas.   Normal cephalic and atramatic  Lungs:   Clear bilaterally to auscultation and percussion. Heart:   HRRR S1 S2 Pulses are 2+ & equal. Abdomen: Bowel sounds are positive, abdomen soft and non-tender without masses Extremities:   No clubbing, cyanosis or edema.  DP +1 Neuro: Alert and oriented X 3. Psych:  Good affect, responds appropriately   LABS: Basic Metabolic Panel:  Recent Labs  02/21/14 0240 02/22/14 0442  NA 140 139  K 5.0 4.5  CL 102 99  CO2 27 28  GLUCOSE 154* 178*  BUN 22 21  CREATININE 1.11* 1.20*  CALCIUM 8.7 9.0   Liver Function Tests: No results found for this basename: AST, ALT, ALKPHOS, BILITOT, PROT, ALBUMIN,  in the last 72 hours No results found for this basename: LIPASE, AMYLASE,  in the last 72 hours CBC:  Recent Labs  02/21/14 0240 02/22/14 0442  WBC 10.0 9.6  HGB 8.6* 9.2*  HCT 28.0* 30.5*  MCV 92.7 93.0  PLT 254 254   Cardiac Enzymes: No results found for this basename: CKTOTAL, CKMB, CKMBINDEX, TROPONINI,  in the last 72 hours BNP: No components found with this basename: POCBNP,  D-Dimer: No results found for this basename: DDIMER,  in the last 72 hours Hemoglobin A1C: No results found for this basename: HGBA1C,  in the last 72 hours Fasting Lipid Panel: No results found for this basename: CHOL, HDL, LDLCALC, TRIG, CHOLHDL, LDLDIRECT,  in the last 72  hours Thyroid Function Tests: No results found for this basename: TSH, T4TOTAL, FREET3, T3FREE, THYROIDAB,  in the last 72 hours Anemia Panel: No results found for this basename: VITAMINB12, FOLATE, FERRITIN, TIBC, IRON, RETICCTPCT,  in the last 72 hours Coag Panel:   Lab Results  Component Value Date   INR 1.15 02/20/2014   INR 1.04 02/19/2014   INR 1.01 11/24/2013    RADIOLOGY: Ct Angio Head W/cm &/or Wo Cm  02/19/2014   CLINICAL DATA:  Fall.  Intracranial hemorrhage.  EXAM: CT ANGIOGRAPHY HEAD  TECHNIQUE: Multidetector CT imaging of the head was performed using the standard protocol during bolus administration of intravenous contrast. Multiplanar CT image reconstructions and MIPs were obtained to evaluate the vascular anatomy.  CONTRAST:  41mL OMNIPAQUE IOHEXOL 350 MG/ML SOLN  COMPARISON:  CT head without contrast 02/19/2014.  FINDINGS: Dense atherosclerotic calcifications are present within the cavernous carotid arteries bilaterally. The lumen of the right internal carotid artery is narrowed to less than 1 mm. The lumen of the left internal carotid artery is narrowed to 1.2 mm. The ICA termini are intact bilaterally. The A1 and M1 segments are within normal limits. The MCA bifurcations are intact. There is no significant aneurysm within the anterior circulation. There is some attenuation of distal ACA and MCA branch  vessels without a significant proximal stenosis or occlusion.  The left vertebral artery is the dominant vessel. The PICA origins are visualized and normal bilaterally. The right vertebral artery terminates at the PICA. The left vertebral artery continues as the basilar artery with mild proximal narrowing. Both posterior cerebral arteries originate from the basilar tip. The PCA branch vessels are within normal limits bilaterally.  The dural sinuses are patent.  Cortical veins are intact.  Review of the MIP images confirms the above findings.  IMPRESSION: 1. No focal aneurysm to explain the  patient's hemorrhage. 2. Dense atherosclerotic calcifications with moderate stenoses in the cavernous ICA segments bilaterally. 3. Mild small vessel disease involving the ACA and MCA branch vessels bilaterally. No significant proximal stenosis or occlusion.   Electronically Signed   By: Lawrence Santiago M.D.   On: 02/19/2014 18:27   Ct Head Wo Contrast  02/23/2014   CLINICAL DATA:  Followup intracranial hemorrhage.  Fall.  EXAM: CT HEAD WITHOUT CONTRAST  TECHNIQUE: Contiguous axial images were obtained from the base of the skull through the vertex without intravenous contrast.  COMPARISON:  CT 02/20/2014  FINDINGS: High-density subarachnoid hemorrhage remains bilaterally with mild improvement. Slight increase in blood in the occipital horns bilaterally. No new area of hemorrhage identified. Ventricle size is unchanged from prior studies  Mild chronic microvascular ischemic change in the white matter. No acute infarct or mass identified.  IMPRESSION: Mild improvement in subarachnoid hemorrhage. Slight increase in intraventricular blood. Ventricle size is stable without significant hydrocephalus.   Electronically Signed   By: Franchot Gallo M.D.   On: 02/23/2014 11:00   Ct Head Wo Contrast  02/20/2014   CLINICAL DATA:  Followup traumatic brain injury  EXAM: CT HEAD WITHOUT CONTRAST  TECHNIQUE: Contiguous axial images were obtained from the base of the skull through the vertex without intravenous contrast.  COMPARISON:  02/19/2014  FINDINGS: No acute fracture. The mastoids, middle ears, and imaged paranasal sinuses remain aerated. No acute soft tissue findings in the orbits.  Similar degree of subarachnoid blood within sulci of the right more than left cerebral convexities. Hemorrhage is again noted within the basal cisterns. Intraventricular hemorrhage has increased and volume, layering in the occipital horns of the lateral ventricles. There is no evidence of hydrocephalus. No evidence of acute infarct, brain edema,  shift, or herniation. Lacunar spaces in the bilateral putamen are again noted.  IMPRESSION: 1. Stable volume of subarachnoid hemorrhage. 2. Increased, but still small volume, intraventricular hemorrhage at the lateral ventricles. No hydrocephalus.   Electronically Signed   By: Jorje Guild M.D.   On: 02/20/2014 04:58   Ct Head Wo Contrast  02/19/2014   CLINICAL DATA:  Fall  EXAM: CT HEAD WITHOUT CONTRAST  CT CERVICAL SPINE WITHOUT CONTRAST  TECHNIQUE: Multidetector CT imaging of the head and cervical spine was performed following the standard protocol without intravenous contrast. Multiplanar CT image reconstructions of the cervical spine were also generated.  COMPARISON:  None.  FINDINGS: CT HEAD FINDINGS  Moderate to large subarachnoid hemorrhage. There is high-density acute hemorrhage in the basilar cisterns extending into the right sylvian fissure. There is subarachnoid hemorrhage over the convexity bilaterally, right greater than left. No intraventricular hemorrhage. No hydrocephalus.  Negative for acute infarct or mass.  Negative for skull fracture.  CT CERVICAL SPINE FINDINGS  Fracture the right first rib without significant displacement. No apical hematoma identified.  1 mm anterior slip C4-5. Extensive disc degeneration and spondylosis at C3-4 with right-sided facet degeneration. Bilateral  facet degeneration at C4-5. Solid fusion at C5-6. Spondylosis at C6-7, C7-T1, and T1-2. Diffuse facet degeneration.  Negative for cervical spine fracture.  IMPRESSION: Moderate to advanced subarachnoid hemorrhage including significant blood in the basilar cisterns. This pattern could be seen with trauma however aneurysm rupture needs to be considered based on the amount of blood in the basilar cisterns.  Nondisplaced fracture right first rib.  Advanced cervical spondylosis.  No cervical spine fracture.  Carotid artery calcification bilaterally.  Critical Value/emergent results were called by telephone at the time of  interpretation on 02/19/2014 at 4:16 PM to Dr. Daleen Bo , who verbally acknowledged these results.   Electronically Signed   By: Franchot Gallo M.D.   On: 02/19/2014 16:17   Ct Cervical Spine Wo Contrast  02/19/2014   CLINICAL DATA:  Fall  EXAM: CT HEAD WITHOUT CONTRAST  CT CERVICAL SPINE WITHOUT CONTRAST  TECHNIQUE: Multidetector CT imaging of the head and cervical spine was performed following the standard protocol without intravenous contrast. Multiplanar CT image reconstructions of the cervical spine were also generated.  COMPARISON:  None.  FINDINGS: CT HEAD FINDINGS  Moderate to large subarachnoid hemorrhage. There is high-density acute hemorrhage in the basilar cisterns extending into the right sylvian fissure. There is subarachnoid hemorrhage over the convexity bilaterally, right greater than left. No intraventricular hemorrhage. No hydrocephalus.  Negative for acute infarct or mass.  Negative for skull fracture.  CT CERVICAL SPINE FINDINGS  Fracture the right first rib without significant displacement. No apical hematoma identified.  1 mm anterior slip C4-5. Extensive disc degeneration and spondylosis at C3-4 with right-sided facet degeneration. Bilateral facet degeneration at C4-5. Solid fusion at C5-6. Spondylosis at C6-7, C7-T1, and T1-2. Diffuse facet degeneration.  Negative for cervical spine fracture.  IMPRESSION: Moderate to advanced subarachnoid hemorrhage including significant blood in the basilar cisterns. This pattern could be seen with trauma however aneurysm rupture needs to be considered based on the amount of blood in the basilar cisterns.  Nondisplaced fracture right first rib.  Advanced cervical spondylosis.  No cervical spine fracture.  Carotid artery calcification bilaterally.  Critical Value/emergent results were called by telephone at the time of interpretation on 02/19/2014 at 4:16 PM to Dr. Daleen Bo , who verbally acknowledged these results.   Electronically Signed   By:  Franchot Gallo M.D.   On: 02/19/2014 16:17   Dg Chest Port 1 View  02/19/2014   CLINICAL DATA:  The patient fainted today.  EXAM: PORTABLE CHEST - 1 VIEW  COMPARISON:  November 23, 2013 the mediastinal contour is normal. The heart size is enlarged. There is bilateral diffuse increased pulmonary interstitium. Minimal bilateral pleural effusions are identified. The visualized skeletal structures are stable.  IMPRESSION: Mild congestive heart failure.   Electronically Signed   By: Abelardo Diesel M.D.   On: 02/19/2014 15:07   ASSESSMENT/PLAN:  80 year old female with coronary artery disease status post rotational atherectomy of LAD with 2 drug-eluting stent placement in April of 2015 who suffered a mechanical fall and subarachnoid hemorrhage.  1) I agree with neurosurgery that she needs antiplatelet therapy especially given the temporal nature of her stent placement. Please restart aspirin 81 mg and Plavix when felt comfortable from a neurosurgical perspective. Obviously, the risks and benefits of the timing the restart of antiplatelet therapy must be heavily weighed in the light of recent subarachnoid hemorrhage. Without dual antiplatelet therapy, she is at increased risk for stent thrombosis. She understands this.  2. Coronary artery disease-as described above.  Stable, no symptoms.  3. Fall-likely mechanical fall but unknown. She was taking out the garbage with her walker and turned around, felt a sensation like she was unable to take in a deep breath she thinks that was the last she remembers. Questionable arrhythmia. I would recommended event monitor on discharge.  Also in light of syncope and pulmonary HTN on echo need to rule out PE.  Will check a d-dimer 4. Severe pulmonary HTN with PASP 47mmHg of unclear etiology.  Most likely etiology is COPD.  If D-Dimer is negative this can be worked up as an Char[employer] fundraiser.  If d-dimer is elevated then she will need a chest CT angio.  Sueanne Margarita, MD  02/23/2014  11:37  AM

## 2014-02-24 ENCOUNTER — Inpatient Hospital Stay (HOSPITAL_COMMUNITY): Payer: Medicare HMO

## 2014-02-24 DIAGNOSIS — R55 Syncope and collapse: Secondary | ICD-10-CM

## 2014-02-24 LAB — GLUCOSE, CAPILLARY
GLUCOSE-CAPILLARY: 292 mg/dL — AB (ref 70–99)
Glucose-Capillary: 142 mg/dL — ABNORMAL HIGH (ref 70–99)
Glucose-Capillary: 179 mg/dL — ABNORMAL HIGH (ref 70–99)
Glucose-Capillary: 155 mg/dL — ABNORMAL HIGH (ref 70–99)

## 2014-02-24 MED ORDER — ZOLPIDEM TARTRATE 5 MG PO TABS: 5.0000 mg | ORAL_TABLET | Freq: Every evening | ORAL | Status: DC | PRN

## 2014-02-24 MED ORDER — TECHNETIUM TC 99M DIETHYLENETRIAME-PENTAACETIC ACID: 40.0000 | Freq: Once | INTRAVENOUS | Status: AC | PRN

## 2014-02-24 MED ORDER — TECHNETIUM TO 99M ALBUMIN AGGREGATED
6.0000 | Freq: Once | INTRAVENOUS | Status: AC | PRN
  Administered 2014-02-24: 6 via INTRAVENOUS

## 2014-02-24 MED ORDER — ALPRAZOLAM 0.25 MG PO TABS
0.2500 mg | ORAL_TABLET | Freq: Three times a day (TID) | ORAL | Status: DC | PRN
  Administered 2014-02-24: 0.25 mg via ORAL
  Filled 2014-02-24: qty 1

## 2014-02-24 MED ORDER — LISINOPRIL 20 MG PO TABS
20.0000 mg | ORAL_TABLET | Freq: Every day | ORAL | Status: DC
  Administered 2014-02-25 – 2014-02-27 (×3): 20 mg via ORAL
  Filled 2014-02-24 (×3): qty 1

## 2014-02-24 NOTE — Progress Notes (Addendum)
[facility] PROGRESS NOTE  Dana Blackwell RJJ:[id number] DOB: 1927-10-28 DOA: 02/19/2014 PCP: [name], MD  PCCM transfer to [facility]  7/5  Brief Narrative:  80 year old patient with h/o CVA, DM, CAD recent PCI/stents x2 (DES) to her LAD on 4/15 admitted on 02/19/14 after a fall vs syncope, resulting in acute subarachnoid hemorrhage as well as fracture of her first rib.  Admitted by PCCM, Neurosurgery has been following with conservative management. Repeat head CT has been stable. Both aspirin and Plavix have been on hold since admission. Transferred from PCCM to [facility] 7/5 [place] regarding restarting antiplatelet therapy. Also Cards following, ECHO with Elevated PA pressures, VQ and Le dopplers pending Disposition: SNF when stable   Assessment/Plan: 1. Sub arachnoid hemorrhage-Moderate to Large based on Admission CT -following fall vs Syncope -CTA no aneurysm noted -Neurosurgery following, conservative mgt -ASA/plavix on hold, per Neurosurgery  repeat CT head 7/6 with mild improvement in [facility] and slight increase in IV blood, d/w Dr.Kritzer this am, he will review scan and leave recs today   2. CAD -recent PCI and stenting of LAD with DES x2 in 4/15 -plavix and ASA on hold due to 1, Cards consult appreciated -Continue metoprolol/statin -resume ASA or plavix or both when ok with NEurosurgery  3. Syncope vs Fall -2D ECHO with grade 2 DD and elevated PA pressure -See below -Event monitor per Cards @ [facility]  4. Elevated PA pressure -ordered VQ scan and lower ext dopplers to r/o VTE -if VTE negative, likely from COPD, ? OSA  5. DM -stable -continue lantus, SSI  6. HTN -improved but still high at times -continue  clonidine, lisinopril -continue imdur, metoprolol -hydralazine PRN  7. GERD -continue PPI  8. Hypothyroidism -continue synthroid  9. COPD -stable, on Home O2  10. Right first rib fx  -incentive spirometry, supportive care  DVT proph:  SCDs  Code Status: Full Code Family Communication: none at bedside Disposition Plan: SNF recommended by PT/Ot when stable   Consultants:  Neurosurgery  HPI/Subjective: Feels weak, no dyspnea at rest some with activity  Objective: Filed Vitals:   02/24/14 0529  BP: 181/52  Pulse: 84  Temp: 98 F (36.7 C)  Resp: 16    Intake/Output Summary (Last 24 hours) at 02/24/14 0927 Last data filed at 02/23/14 1754  Gross per 24 hour  Intake    240 ml  Output      0 ml  Net    240 ml   Filed Weights   02/19/14 1426 02/20/14 0500  Weight: 66.679 kg (147 lb) 66.6 kg (146 lb 13.2 oz)    Exam:   General:  AAOx3, no distress  Cardiovascular: S1S2/RRR  Respiratory: CTAB  Abdomen: soft, NT, BS present  Musculoskeletal: no edema c/c  NEuro: non focal   Data Reviewed: Basic Metabolic Panel:  Recent Labs Lab 02/19/14 1505 02/20/14 0250 02/21/14 0240 02/22/14 0442  NA 137 137 140 139  K 4.8 5.4* 5.0 4.5  CL 98 100 102 99  CO2 27 28 27 28   GLUCOSE 175* 231* 154* 178*  BUN 23 19 22 21   CREATININE 1.20* 1.14* 1.11* 1.20*  CALCIUM 8.9 8.6 8.7 9.0   Liver Function Tests: No results found for this basename: AST, ALT, ALKPHOS, BILITOT, PROT, ALBUMIN,  in the last 168 hours No results found for this basename: LIPASE, AMYLASE,  in the last 168 hours No results found for this basename: AMMONIA,  in the last 168 hours CBC:  Recent Labs Lab  02/19/14 1505 02/20/14 0250 02/21/14 0240 02/22/14 0442  WBC 7.7 9.5 10.0 9.6  NEUTROABS 5.5  --   --   --   HGB 9.9* 8.6* 8.6* 9.2*  HCT 32.6* 27.7* 28.0* 30.5*  MCV 92.6 92.3 92.7 93.0  PLT 279 265 254 254   Cardiac Enzymes:  Recent Labs Lab 02/19/14 1505  TROPONINI <0.30   BNP (last 3 results) No results found for this basename: PROBNP,  in the last 8760 hours CBG:  Recent Labs Lab 02/23/14 0646 02/23/14 1148 02/23/14 1640 02/23/14 2143 02/24/14 0742  GLUCAP 119* 165* 206* 186* 142*    Recent Results  (from the past 240 hour(s))  URINE CULTURE     Status: None   Collection Time    02/19/14  3:23 PM      Result Value Ref Range Status   Specimen Description URINE, CLEAN CATCH   Final   Special Requests NONE   Final   Culture  Setup Time     Final   Value: 02/19/2014 15:52     Performed at [facility]     Final   Value: 15,000 COLONIES/ML     Performed at [facility]   Culture     Final   Value: LACTOBACILLUS SPECIES     Note: Standardized susceptibility testing for this organism is not available.     Performed at [facility]   Report Status 02/20/2014 FINAL   Final  MRSA PCR SCREENING     Status: None   Collection Time    02/19/14  9:20 PM      Result Value Ref Range Status   MRSA by PCR NEGATIVE  NEGATIVE Final   Comment:            The GeneXpert MRSA Assay (FDA     approved for NASAL specimens     only), is one component of a     comprehensive MRSA colonization     surveillance program. It is not     intended to diagnose MRSA     infection nor to guide or     monitor treatment for     MRSA infections.     Studies: Ct Head Wo Contrast  02/23/2014   CLINICAL DATA:  Followup intracranial hemorrhage.  Fall.  EXAM: CT HEAD WITHOUT CONTRAST  TECHNIQUE: Contiguous axial images were obtained from the base of the skull through the vertex without intravenous contrast.  COMPARISON:  CT 02/20/2014  FINDINGS: High-density subarachnoid hemorrhage remains bilaterally with mild improvement. Slight increase in blood in the occipital horns bilaterally. No new area of hemorrhage identified. Ventricle size is unchanged from prior studies  Mild chronic microvascular ischemic change in the white matter. No acute infarct or mass identified.  IMPRESSION: Mild improvement in subarachnoid hemorrhage. Slight increase in intraventricular blood. Ventricle size is stable without significant hydrocephalus.   Electronically Signed   By: Franchot Gallo M.D.   On:  02/23/2014 11:00    Scheduled Meds: . atorvastatin  20 mg Oral q1800  . cloNIDine  0.1 mg Oral BID  . insulin aspart  0-15 Units Subcutaneous TID WC  . insulin aspart  0-5 Units Subcutaneous QHS  . insulin glargine  10 Units Subcutaneous QHS  . isosorbide mononitrate  30 mg Oral Daily  . levothyroxine  75 mcg Oral QAC breakfast  . lidocaine  1 patch Transdermal Q24H  . lisinopril  10 mg Oral Daily  . metoprolol succinate  100 mg Oral Daily  . pantoprazole  40 mg Oral Daily  . sertraline  50 mg Oral Daily   Continuous Infusions:  Antibiotics Given (last 72 hours)   None      Active Problems:   DM, TYPE II   CAD 3V at cath 2006- medical Rx   Traumatic SAH (small)   Syncope   Pulmonary HTN    Time spent: 93min    Krishawna Stiefel  [facility] Pager [phone]. If 7PM-7AM, please contact night-coverage at [email or web address], password [facility] 02/24/2014, 9:27 AM  LOS: 5 days

## 2014-02-24 NOTE — Clinical Social Work Note (Signed)
CSW following [facility] authorization for SNF placement. CSW met with pt and pt's family member at bedside to discuss SNF bed options. Pt has chosen [facility] as discharge disposition. CSW has left message with [facility] admissions liaison regarding pt's discharge disposition.  CSW will continue to follow and assist with discharge planning needs.  Lubertha Sayres, MSW, [facility] Licensed Clinical Social Worker [phone] and [phone]

## 2014-02-24 NOTE — Progress Notes (Signed)
Patient ID: Dana Blackwell, female   DOB: 1928-01-12, 80 y.o.   MRN: [id number] Patient stable from neuro status. Her Ct from yesterday shows some increase in intra ventricular blood, but no hydrocephalus. I think the safest thing from the brain stand point would be just to keep her off all anti-coagulation for a few weeks, including her asa, and recheck a CT at that time.

## 2014-02-24 NOTE — Progress Notes (Signed)
VASCULAR LAB PRELIMINARY  PRELIMINARY  PRELIMINARY  PRELIMINARY  Bilateral lower extremity venous duplex  completed.    Preliminary report:  Bilateral:  No evidence of DVT, superficial thrombosis, or Baker's Cyst.   Sheetal Lyall, RVT 02/24/2014, 11:12 AM

## 2014-02-24 NOTE — Progress Notes (Signed)
SUBJECTIVE:  Denies chest pain or SOB  OBJECTIVE:   Vitals:   Filed Vitals:   02/23/14 2144 02/24/14 0141 02/24/14 0529 02/24/14 0930  BP: 159/55 172/63 181/52 161/52  Pulse: 63 64 84 65  Temp: 98.4 F (36.9 C) 97.8 F (36.6 C) 98 F (36.7 C) 98.1 F (36.7 C)  TempSrc: Oral Oral Oral Oral  Resp: 20 18 16 18   Height:      Weight:      SpO2: 99% 98% 98% 98%     PHYSICAL EXAM General: Well developed, well nourished, in no acute distress Head: Eyes PERRLA, No xanthomas.   Normal cephalic and atramatic  Lungs:   Clear bilaterally to auscultation and percussion. Heart:   HRRR S1 S2 Pulses are 2+ & equal. Abdomen: Bowel sounds are positive, abdomen soft and non-tender without masses Extremities:   No clubbing, cyanosis or edema.  DP +1 Neuro: Alert and oriented X 3. Psych:  Good affect, responds appropriately   LABS: Basic Metabolic Panel:  Recent Labs  02/22/14 0442  NA 139  K 4.5  CL 99  CO2 28  GLUCOSE 178*  BUN 21  CREATININE 1.20*  CALCIUM 9.0   Liver Function Tests: No results found for this basename: AST, ALT, ALKPHOS, BILITOT, PROT, ALBUMIN,  in the last 72 hours No results found for this basename: LIPASE, AMYLASE,  in the last 72 hours CBC:  Recent Labs  02/22/14 0442  WBC 9.6  HGB 9.2*  HCT 30.5*  MCV 93.0  PLT 254   Cardiac Enzymes: No results found for this basename: CKTOTAL, CKMB, CKMBINDEX, TROPONINI,  in the last 72 hours BNP: No components found with this basename: POCBNP,  D-Dimer:  Recent Labs  02/23/14 1325  DDIMER 1.69*   Hemoglobin A1C: No results found for this basename: HGBA1C,  in the last 72 hours Fasting Lipid Panel: No results found for this basename: CHOL, HDL, LDLCALC, TRIG, CHOLHDL, LDLDIRECT,  in the last 72 hours Thyroid Function Tests: No results found for this basename: TSH, T4TOTAL, FREET3, T3FREE, THYROIDAB,  in the last 72 hours Anemia Panel: No results found for this basename: VITAMINB12, FOLATE, FERRITIN,  TIBC, IRON, RETICCTPCT,  in the last 72 hours Coag Panel:   Lab Results  Component Value Date   INR 1.15 02/20/2014   INR 1.04 02/19/2014   INR 1.01 11/24/2013    RADIOLOGY: Ct Angio Head W/cm &/or Wo Cm  02/19/2014   CLINICAL DATA:  Fall.  Intracranial hemorrhage.  EXAM: CT ANGIOGRAPHY HEAD  TECHNIQUE: Multidetector CT imaging of the head was performed using the standard protocol during bolus administration of intravenous contrast. Multiplanar CT image reconstructions and MIPs were obtained to evaluate the vascular anatomy.  CONTRAST:  69mL OMNIPAQUE IOHEXOL 350 MG/ML SOLN  COMPARISON:  CT head without contrast 02/19/2014.  FINDINGS: Dense atherosclerotic calcifications are present within the cavernous carotid arteries bilaterally. The lumen of the right internal carotid artery is narrowed to less than 1 mm. The lumen of the left internal carotid artery is narrowed to 1.2 mm. The ICA termini are intact bilaterally. The A1 and M1 segments are within normal limits. The MCA bifurcations are intact. There is no significant aneurysm within the anterior circulation. There is some attenuation of distal ACA and MCA branch vessels without a significant proximal stenosis or occlusion.  The left vertebral artery is the dominant vessel. The PICA origins are visualized and normal bilaterally. The right vertebral artery terminates at the PICA. The left vertebral artery continues  as the basilar artery with mild proximal narrowing. Both posterior cerebral arteries originate from the basilar tip. The PCA branch vessels are within normal limits bilaterally.  The dural sinuses are patent.  Cortical veins are intact.  Review of the MIP images confirms the above findings.  IMPRESSION: 1. No focal aneurysm to explain the patient's hemorrhage. 2. Dense atherosclerotic calcifications with moderate stenoses in the cavernous ICA segments bilaterally. 3. Mild small vessel disease involving the ACA and MCA branch vessels bilaterally. No  significant proximal stenosis or occlusion.   Electronically Signed   By: Lawrence Santiago M.D.   On: 02/19/2014 18:27   Ct Head Wo Contrast  02/23/2014   CLINICAL DATA:  Followup intracranial hemorrhage.  Fall.  EXAM: CT HEAD WITHOUT CONTRAST  TECHNIQUE: Contiguous axial images were obtained from the base of the skull through the vertex without intravenous contrast.  COMPARISON:  CT 02/20/2014  FINDINGS: High-density subarachnoid hemorrhage remains bilaterally with mild improvement. Slight increase in blood in the occipital horns bilaterally. No new area of hemorrhage identified. Ventricle size is unchanged from prior studies  Mild chronic microvascular ischemic change in the white matter. No acute infarct or mass identified.  IMPRESSION: Mild improvement in subarachnoid hemorrhage. Slight increase in intraventricular blood. Ventricle size is stable without significant hydrocephalus.   Electronically Signed   By: Franchot Gallo M.D.   On: 02/23/2014 11:00   Ct Head Wo Contrast  02/20/2014   CLINICAL DATA:  Followup traumatic brain injury  EXAM: CT HEAD WITHOUT CONTRAST  TECHNIQUE: Contiguous axial images were obtained from the base of the skull through the vertex without intravenous contrast.  COMPARISON:  02/19/2014  FINDINGS: No acute fracture. The mastoids, middle ears, and imaged paranasal sinuses remain aerated. No acute soft tissue findings in the orbits.  Similar degree of subarachnoid blood within sulci of the right more than left cerebral convexities. Hemorrhage is again noted within the basal cisterns. Intraventricular hemorrhage has increased and volume, layering in the occipital horns of the lateral ventricles. There is no evidence of hydrocephalus. No evidence of acute infarct, brain edema, shift, or herniation. Lacunar spaces in the bilateral putamen are again noted.  IMPRESSION: 1. Stable volume of subarachnoid hemorrhage. 2. Increased, but still small volume, intraventricular hemorrhage at the  lateral ventricles. No hydrocephalus.   Electronically Signed   By: Jorje Guild M.D.   On: 02/20/2014 04:58   Ct Head Wo Contrast  02/19/2014   CLINICAL DATA:  Fall  EXAM: CT HEAD WITHOUT CONTRAST  CT CERVICAL SPINE WITHOUT CONTRAST  TECHNIQUE: Multidetector CT imaging of the head and cervical spine was performed following the standard protocol without intravenous contrast. Multiplanar CT image reconstructions of the cervical spine were also generated.  COMPARISON:  None.  FINDINGS: CT HEAD FINDINGS  Moderate to large subarachnoid hemorrhage. There is high-density acute hemorrhage in the basilar cisterns extending into the right sylvian fissure. There is subarachnoid hemorrhage over the convexity bilaterally, right greater than left. No intraventricular hemorrhage. No hydrocephalus.  Negative for acute infarct or mass.  Negative for skull fracture.  CT CERVICAL SPINE FINDINGS  Fracture the right first rib without significant displacement. No apical hematoma identified.  1 mm anterior slip C4-5. Extensive disc degeneration and spondylosis at C3-4 with right-sided facet degeneration. Bilateral facet degeneration at C4-5. Solid fusion at C5-6. Spondylosis at C6-7, C7-T1, and T1-2. Diffuse facet degeneration.  Negative for cervical spine fracture.  IMPRESSION: Moderate to advanced subarachnoid hemorrhage including significant blood in the basilar cisterns. This  pattern could be seen with trauma however aneurysm rupture needs to be considered based on the amount of blood in the basilar cisterns.  Nondisplaced fracture right first rib.  Advanced cervical spondylosis.  No cervical spine fracture.  Carotid artery calcification bilaterally.  Critical Value/emergent results were called by telephone at the time of interpretation on 02/19/2014 at 4:16 PM to Dr. Daleen Bo , who verbally acknowledged these results.   Electronically Signed   By: Franchot Gallo M.D.   On: 02/19/2014 16:17   Ct Cervical Spine Wo  Contrast  02/19/2014   CLINICAL DATA:  Fall  EXAM: CT HEAD WITHOUT CONTRAST  CT CERVICAL SPINE WITHOUT CONTRAST  TECHNIQUE: Multidetector CT imaging of the head and cervical spine was performed following the standard protocol without intravenous contrast. Multiplanar CT image reconstructions of the cervical spine were also generated.  COMPARISON:  None.  FINDINGS: CT HEAD FINDINGS  Moderate to large subarachnoid hemorrhage. There is high-density acute hemorrhage in the basilar cisterns extending into the right sylvian fissure. There is subarachnoid hemorrhage over the convexity bilaterally, right greater than left. No intraventricular hemorrhage. No hydrocephalus.  Negative for acute infarct or mass.  Negative for skull fracture.  CT CERVICAL SPINE FINDINGS  Fracture the right first rib without significant displacement. No apical hematoma identified.  1 mm anterior slip C4-5. Extensive disc degeneration and spondylosis at C3-4 with right-sided facet degeneration. Bilateral facet degeneration at C4-5. Solid fusion at C5-6. Spondylosis at C6-7, C7-T1, and T1-2. Diffuse facet degeneration.  Negative for cervical spine fracture.  IMPRESSION: Moderate to advanced subarachnoid hemorrhage including significant blood in the basilar cisterns. This pattern could be seen with trauma however aneurysm rupture needs to be considered based on the amount of blood in the basilar cisterns.  Nondisplaced fracture right first rib.  Advanced cervical spondylosis.  No cervical spine fracture.  Carotid artery calcification bilaterally.  Critical Value/emergent results were called by telephone at the time of interpretation on 02/19/2014 at 4:16 PM to Dr. Daleen Bo , who verbally acknowledged these results.   Electronically Signed   By: Franchot Gallo M.D.   On: 02/19/2014 16:17   Dg Chest Port 1 View  02/19/2014   CLINICAL DATA:  The patient fainted today.  EXAM: PORTABLE CHEST - 1 VIEW  COMPARISON:  November 23, 2013 the mediastinal  contour is normal. The heart size is enlarged. There is bilateral diffuse increased pulmonary interstitium. Minimal bilateral pleural effusions are identified. The visualized skeletal structures are stable.  IMPRESSION: Mild congestive heart failure.   Electronically Signed   By: Abelardo Diesel M.D.   On: 02/19/2014 15:07   ASSESSMENT/PLAN:  80 year old female with coronary artery disease status post rotational atherectomy of LAD with 2 drug-eluting stent placement in April of 2015 who suffered a mechanical fall and subarachnoid hemorrhage.  1) She needs antiplatelet therapy especially given the temporal nature of her stent placement but given increased blood on repeat head CT Neurosurgery wants to wait.   Will need to restart aspirin 81 mg and Plavix when felt comfortable from a neurosurgical perspective. Obviously, the risks and benefits of the timing the restart of antiplatelet therapy must be heavily weighed in the light of recent subarachnoid hemorrhage. Without dual antiplatelet therapy, she is at increased risk for stent thrombosis. She understands this.  2. Coronary artery disease-as described above. Stable, no symptoms.  3. Fall-likely mechanical fall but unknown. She was taking out the garbage with her walker and turned around, felt a sensation like she  was unable to take in a deep breath she thinks that was the last she remembers. Questionable arrhythmia. I would recommended event monitor on discharge. Also in light of syncope and pulmonary HTN on echo need to rule out PE. D-Dimer is elevated and VQ scan ordered to rule out PE. 4. Severe pulmonary HTN with PASP 13mmHg of unclear etiology. Most likely etiology is COPD. If D-Dimer is negative this can be worked up as an [employer]. If d-dimer is elevated then she will need a chest CT angio.  5.  HTN poorly controlled - increase Lisinopril to 20mg  daily   Dana Margarita, MD  02/24/2014  2:09 PM

## 2014-02-25 ENCOUNTER — Encounter (HOSPITAL_COMMUNITY): Payer: Medicare HMO

## 2014-02-25 DIAGNOSIS — I10 Essential (primary) hypertension: Secondary | ICD-10-CM

## 2014-02-25 LAB — GLUCOSE, CAPILLARY
GLUCOSE-CAPILLARY: 204 mg/dL — AB (ref 70–99)
GLUCOSE-CAPILLARY: 240 mg/dL — AB (ref 70–99)
Glucose-Capillary: 167 mg/dL — ABNORMAL HIGH (ref 70–99)
Glucose-Capillary: 139 mg/dL — ABNORMAL HIGH (ref 70–99)

## 2014-02-25 MED ORDER — BISACODYL 5 MG PO TBEC
10.0000 mg | DELAYED_RELEASE_TABLET | Freq: Every day | ORAL | Status: DC | PRN
  Administered 2014-02-25 – 2014-02-26 (×2): 10 mg via ORAL
  Filled 2014-02-25 (×2): qty 2

## 2014-02-25 MED ORDER — SENNOSIDES-DOCUSATE SODIUM 8.6-50 MG PO TABS
1.0000 | ORAL_TABLET | Freq: Every day | ORAL | Status: DC
  Administered 2014-02-25 – 2014-02-27 (×3): 1 via ORAL
  Filled 2014-02-25 (×3): qty 1

## 2014-02-25 NOTE — Clinical Social Work Note (Signed)
CSW received confirmation of SNF authorization from [facility] [phone]). CSW spoke with [facility] admissions liaison, who stated she will be meeting with pt at bedside to complete admission paperwork on 02/25/2014 in preparation for future discharge.  CSW will continue to follow and assist with discharge planning once pt is medically stable for discharge.  Lubertha Sayres, MSW, [facility] Licensed Clinical Social Worker [phone] and [phone]

## 2014-02-25 NOTE — Progress Notes (Signed)
SUBJECTIVE:  No complaints today except for rib pain  OBJECTIVE:   Vitals:   Filed Vitals:   02/24/14 1414 02/24/14 1901 02/24/14 2210 02/25/14 0232  BP: 134/61 131/67 153/71 147/71  Pulse: 61 65 54 64  Temp: 98.5 F (36.9 C) 98.6 F (37 C) 99.1 F (37.3 C) 99 F (37.2 C)  TempSrc: Oral Oral Oral Oral  Resp: 18 18 18    Height:      Weight:      SpO2: 100% 99% 98% 100%   I&O's:   Intake/Output Summary (Last 24 hours) at 02/25/14 5093 Last data filed at 02/24/14 1300  Gross per 24 hour  Intake    240 ml  Output      0 ml  Net    240 ml    PHYSICAL EXAM General: Well developed, well nourished, in no acute distress Head: Eyes PERRLA, No xanthomas.   Normal cephalic and atramatic  Lungs:   Clear bilaterally to auscultation and percussion. Heart:   HRRR S1 S2 Pulses are 2+ & equal. Abdomen: Bowel sounds are positive, abdomen soft and non-tender without masses  Extremities:   No clubbing, cyanosis or edema.  DP +1 Neuro: Alert and oriented X 3. Psych:  Good affect, responds appropriately   LABS: Basic Metabolic Panel: No results found for this basename: NA, K, CL, CO2, GLUCOSE, BUN, CREATININE, CALCIUM, MG, PHOS,  in the last 72 hours Liver Function Tests: No results found for this basename: AST, ALT, ALKPHOS, BILITOT, PROT, ALBUMIN,  in the last 72 hours No results found for this basename: LIPASE, AMYLASE,  in the last 72 hours CBC: No results found for this basename: WBC, NEUTROABS, HGB, HCT, MCV, PLT,  in the last 72 hours Cardiac Enzymes: No results found for this basename: CKTOTAL, CKMB, CKMBINDEX, TROPONINI,  in the last 72 hours BNP: No components found with this basename: POCBNP,  D-Dimer:  Recent Labs  02/23/14 1325  DDIMER 1.69*   Hemoglobin A1C: No results found for this basename: HGBA1C,  in the last 72 hours Fasting Lipid Panel: No results found for this basename: CHOL, HDL, LDLCALC, TRIG, CHOLHDL, LDLDIRECT,  in the last 72 hours Thyroid Function  Tests: No results found for this basename: TSH, T4TOTAL, FREET3, T3FREE, THYROIDAB,  in the last 72 hours Anemia Panel: No results found for this basename: VITAMINB12, FOLATE, FERRITIN, TIBC, IRON, RETICCTPCT,  in the last 72 hours Coag Panel:   Lab Results  Component Value Date   INR 1.15 02/20/2014   INR 1.04 02/19/2014   INR 1.01 11/24/2013    RADIOLOGY: Ct Angio Head W/cm &/or Wo Cm  02/19/2014   CLINICAL DATA:  Fall.  Intracranial hemorrhage.  EXAM: CT ANGIOGRAPHY HEAD  TECHNIQUE: Multidetector CT imaging of the head was performed using the standard protocol during bolus administration of intravenous contrast. Multiplanar CT image reconstructions and MIPs were obtained to evaluate the vascular anatomy.  CONTRAST:  26mL OMNIPAQUE IOHEXOL 350 MG/ML SOLN  COMPARISON:  CT head without contrast 02/19/2014.  FINDINGS: Dense atherosclerotic calcifications are present within the cavernous carotid arteries bilaterally. The lumen of the right internal carotid artery is narrowed to less than 1 mm. The lumen of the left internal carotid artery is narrowed to 1.2 mm. The ICA termini are intact bilaterally. The A1 and M1 segments are within normal limits. The MCA bifurcations are intact. There is no significant aneurysm within the anterior circulation. There is some attenuation of distal ACA and MCA branch vessels without a significant  proximal stenosis or occlusion.  The left vertebral artery is the dominant vessel. The PICA origins are visualized and normal bilaterally. The right vertebral artery terminates at the PICA. The left vertebral artery continues as the basilar artery with mild proximal narrowing. Both posterior cerebral arteries originate from the basilar tip. The PCA branch vessels are within normal limits bilaterally.  The dural sinuses are patent.  Cortical veins are intact.  Review of the MIP images confirms the above findings.  IMPRESSION: 1. No focal aneurysm to explain the patient's hemorrhage. 2.  Dense atherosclerotic calcifications with moderate stenoses in the cavernous ICA segments bilaterally. 3. Mild small vessel disease involving the ACA and MCA branch vessels bilaterally. No significant proximal stenosis or occlusion.   Electronically Signed   By: Lawrence Santiago M.D.   On: 02/19/2014 18:27   Ct Head Wo Contrast  02/23/2014   CLINICAL DATA:  Followup intracranial hemorrhage.  Fall.  EXAM: CT HEAD WITHOUT CONTRAST  TECHNIQUE: Contiguous axial images were obtained from the base of the skull through the vertex without intravenous contrast.  COMPARISON:  CT 02/20/2014  FINDINGS: High-density subarachnoid hemorrhage remains bilaterally with mild improvement. Slight increase in blood in the occipital horns bilaterally. No new area of hemorrhage identified. Ventricle size is unchanged from prior studies  Mild chronic microvascular ischemic change in the white matter. No acute infarct or mass identified.  IMPRESSION: Mild improvement in subarachnoid hemorrhage. Slight increase in intraventricular blood. Ventricle size is stable without significant hydrocephalus.   Electronically Signed   By: Franchot Gallo M.D.   On: 02/23/2014 11:00   Ct Head Wo Contrast  02/20/2014   CLINICAL DATA:  Followup traumatic brain injury  EXAM: CT HEAD WITHOUT CONTRAST  TECHNIQUE: Contiguous axial images were obtained from the base of the skull through the vertex without intravenous contrast.  COMPARISON:  02/19/2014  FINDINGS: No acute fracture. The mastoids, middle ears, and imaged paranasal sinuses remain aerated. No acute soft tissue findings in the orbits.  Similar degree of subarachnoid blood within sulci of the right more than left cerebral convexities. Hemorrhage is again noted within the basal cisterns. Intraventricular hemorrhage has increased and volume, layering in the occipital horns of the lateral ventricles. There is no evidence of hydrocephalus. No evidence of acute infarct, brain edema, shift, or herniation.  Lacunar spaces in the bilateral putamen are again noted.  IMPRESSION: 1. Stable volume of subarachnoid hemorrhage. 2. Increased, but still small volume, intraventricular hemorrhage at the lateral ventricles. No hydrocephalus.   Electronically Signed   By: Jorje Guild M.D.   On: 02/20/2014 04:58   Ct Head Wo Contrast  02/19/2014   CLINICAL DATA:  Fall  EXAM: CT HEAD WITHOUT CONTRAST  CT CERVICAL SPINE WITHOUT CONTRAST  TECHNIQUE: Multidetector CT imaging of the head and cervical spine was performed following the standard protocol without intravenous contrast. Multiplanar CT image reconstructions of the cervical spine were also generated.  COMPARISON:  None.  FINDINGS: CT HEAD FINDINGS  Moderate to large subarachnoid hemorrhage. There is high-density acute hemorrhage in the basilar cisterns extending into the right sylvian fissure. There is subarachnoid hemorrhage over the convexity bilaterally, right greater than left. No intraventricular hemorrhage. No hydrocephalus.  Negative for acute infarct or mass.  Negative for skull fracture.  CT CERVICAL SPINE FINDINGS  Fracture the right first rib without significant displacement. No apical hematoma identified.  1 mm anterior slip C4-5. Extensive disc degeneration and spondylosis at C3-4 with right-sided facet degeneration. Bilateral facet degeneration at C4-5.  Solid fusion at C5-6. Spondylosis at C6-7, C7-T1, and T1-2. Diffuse facet degeneration.  Negative for cervical spine fracture.  IMPRESSION: Moderate to advanced subarachnoid hemorrhage including significant blood in the basilar cisterns. This pattern could be seen with trauma however aneurysm rupture needs to be considered based on the amount of blood in the basilar cisterns.  Nondisplaced fracture right first rib.  Advanced cervical spondylosis.  No cervical spine fracture.  Carotid artery calcification bilaterally.  Critical Value/emergent results were called by telephone at the time of interpretation on  02/19/2014 at 4:16 PM to Dr. Daleen Bo , who verbally acknowledged these results.   Electronically Signed   By: Franchot Gallo M.D.   On: 02/19/2014 16:17   Ct Cervical Spine Wo Contrast  02/19/2014   CLINICAL DATA:  Fall  EXAM: CT HEAD WITHOUT CONTRAST  CT CERVICAL SPINE WITHOUT CONTRAST  TECHNIQUE: Multidetector CT imaging of the head and cervical spine was performed following the standard protocol without intravenous contrast. Multiplanar CT image reconstructions of the cervical spine were also generated.  COMPARISON:  None.  FINDINGS: CT HEAD FINDINGS  Moderate to large subarachnoid hemorrhage. There is high-density acute hemorrhage in the basilar cisterns extending into the right sylvian fissure. There is subarachnoid hemorrhage over the convexity bilaterally, right greater than left. No intraventricular hemorrhage. No hydrocephalus.  Negative for acute infarct or mass.  Negative for skull fracture.  CT CERVICAL SPINE FINDINGS  Fracture the right first rib without significant displacement. No apical hematoma identified.  1 mm anterior slip C4-5. Extensive disc degeneration and spondylosis at C3-4 with right-sided facet degeneration. Bilateral facet degeneration at C4-5. Solid fusion at C5-6. Spondylosis at C6-7, C7-T1, and T1-2. Diffuse facet degeneration.  Negative for cervical spine fracture.  IMPRESSION: Moderate to advanced subarachnoid hemorrhage including significant blood in the basilar cisterns. This pattern could be seen with trauma however aneurysm rupture needs to be considered based on the amount of blood in the basilar cisterns.  Nondisplaced fracture right first rib.  Advanced cervical spondylosis.  No cervical spine fracture.  Carotid artery calcification bilaterally.  Critical Value/emergent results were called by telephone at the time of interpretation on 02/19/2014 at 4:16 PM to Dr. Daleen Bo , who verbally acknowledged these results.   Electronically Signed   By: Franchot Gallo M.D.    On: 02/19/2014 16:17   Nm Pulmonary Perf And Vent  02/24/2014   CLINICAL DATA:  elevated PA pressure and high d dimer  EXAM: NUCLEAR MEDICINE VENTILATION - PERFUSION LUNG SCAN  TECHNIQUE: Ventilation images were obtained in multiple projections using inhaled aerosol technetium 99 M DTPA. Perfusion images were obtained in multiple projections after intravenous injection of Tc-50m MAA.  RADIOPHARMACEUTICALS:  40 mCi Tc-56m DTPA aerosol and 6 mCi Tc-26m MAA  COMPARISON:  Portable chest radiograph dated 02/19/2014.  FINDINGS: Ventilation: No focal ventilation defect. There is blunting of the costophrenic angles.  Perfusion: No wedge shaped peripheral perfusion defects to suggest acute pulmonary embolism. Matched mild blunting of the costophrenic angles.  IMPRESSION: Low probability nuclear medicine ventilation perfusion scan for pulmonary embolus.   Electronically Signed   By: Margaree Mackintosh M.D.   On: 02/24/2014 17:28   Dg Chest Port 1 View  02/19/2014   CLINICAL DATA:  The patient fainted today.  EXAM: PORTABLE CHEST - 1 VIEW  COMPARISON:  November 23, 2013 the mediastinal contour is normal. The heart size is enlarged. There is bilateral diffuse increased pulmonary interstitium. Minimal bilateral pleural effusions are identified. The visualized skeletal  structures are stable.  IMPRESSION: Mild congestive heart failure.   Electronically Signed   By: Abelardo Diesel M.D.   On: 02/19/2014 15:07   ASSESSMENT/PLAN:  80 year old female with coronary artery disease status post rotational atherectomy of LAD with 2 drug-eluting stent placement in April of 2015 who suffered a mechanical fall and subarachnoid hemorrhage.  1) She needs antiplatelet therapy especially given the temporal nature of her stent placement but given increased blood on repeat head CT Neurosurgery wants to wait. Will need to restart aspirin 81 mg and Plavix when felt comfortable from a neurosurgical perspective. Obviously, the risks and benefits of the  timing the restart of antiplatelet therapy must be heavily weighed in the light of recent subarachnoid hemorrhage. Without dual antiplatelet therapy, she is at increased risk for stent thrombosis. She understands this.  2. Coronary artery disease-as described above. Stable, no symptoms.  3. Fall-likely mechanical fall but unknown. She was taking out the garbage with her walker and turned around, felt a sensation like she was unable to take in a deep breath she thinks that was the last she remembers. Questionable arrhythmia. I would recommended event monitor on discharge.  4. D-Dimer elevated and VQ scan low prob for PE.  5. Severe pulmonary HTN with PASP 54mmHg of unclear etiology. Most likely etiology is COPD and this can be worked up as an Char[employer] fundraiser.  6. HTN improved after increasing Lisinopril   Sueanne Margarita, MD  02/25/2014  9:07 AM

## 2014-02-25 NOTE — Progress Notes (Signed)
Physical Therapy Treatment Patient Details Name: Dana Blackwell MRN: [id number] DOB: 01-16-1928 Today's Date: 02/25/2014    History of Present Illness 80 yo female with CAD, HTN, CVA, DM, COPD presents to [facility] ED 7/2 s/p mechanical fall. In ED found to have acute SAH. CT of head showed Moderate to advanced subarachnoid hemorrhage including significant blood in the basilar cisterns.     PT Comments    Pt with improved transfer and ambulation ability/tolerance this date. Pt remains to require minA and supervision for safety. Pt remains unable to return home alone safely at this time. Con't to recommend ST-SNF Upon d/c to achieve safe mod I function.    Follow Up Recommendations  SNF;Supervision/Assistance - 24 hour     Equipment Recommendations  Rolling walker with 5" wheels    Recommendations for Other Services       Precautions / Restrictions Precautions Precautions: Fall Precaution Comments: pt on 2L O2 at home Restrictions Weight Bearing Restrictions: No    Mobility  Bed Mobility Overal bed mobility: Needs Assistance Bed Mobility: Rolling;Sidelying to Sit Rolling: Min guard Sidelying to sit: Min guard       General bed mobility comments: max directional v/c's for technique, increased time, R flank pain  Transfers Overall transfer level: Needs assistance Equipment used: Rolling walker (2 wheeled) Transfers: Sit to/from [facility] Sit to Stand: Min assist Stand pivot transfers: Min assist       General transfer comment: v/c's for safe hand placement, increased time  Ambulation/Gait Ambulation/Gait assistance: Min guard Ambulation Distance (Feet): 120 Feet Assistive device: Rolling walker (2 wheeled) Gait Pattern/deviations: Step-through pattern;Decreased stride length Gait velocity: very decreased Gait velocity interpretation: <1.8 ft/sec, indicative of risk for recurrent falls General Gait Details: no epsisode of LOB, pt with good  walker management. required significant increased time   Financial trader Rankin (Stroke Patients Only)       Balance Overall balance assessment: History of Falls             Standing balance comment: con't to require RW for safe mobility                    Cognition Arousal/Alertness: Awake/alert Behavior During Therapy: WFL for tasks assessed/performed Overall Cognitive Status: Within Functional Limits for tasks assessed Area of Impairment: Memory;Problem solving (delayed processing)     Memory: Decreased short-term memory   Safety/Judgement: Decreased awareness of safety Awareness: Emergent Problem Solving: Slow processing;Difficulty sequencing;Requires verbal cues;Requires tactile cues General Comments: pt with delayed processing, requires increased time to complete all taks    Exercises Shoulder Exercises Shoulder Flexion: AAROM;Right;10 reps;Seated Shoulder ABduction: AAROM;Right;10 reps;Seated    General Comments General comments (skin integrity, edema, etc.): pt assisted to [facility], pt required increased time to perform pericare. v/c's to problem solve, assist to complete thorough hygiene      Pertinent Vitals/Pain R flank pain, pt reports moderate    Home Living                      Prior Function            PT Goals (current goals can now be found in the care plan section) Acute Rehab PT Goals Patient Stated Goal: to get home Progress towards PT goals: Progressing toward goals    Frequency  Min 3X/week    PT Plan Current plan remains appropriate  Co-evaluation             End of Session Equipment Utilized During Treatment: Gait belt;Oxygen Activity Tolerance: Patient tolerated treatment well Patient left: in chair;with call bell/phone within reach;with chair alarm set     Time: 4888-9169 PT Time Calculation (min): 28 min  Charges:  $Gait Training: 8-22 mins $Therapeutic  Exercise: 8-22 mins                    G Codes:      Kingsley Callander 02/25/2014, 10:25 AM  Kittie Plater, PT, DPT Pager #: [phone] Office #: [phone]

## 2014-02-25 NOTE — Care Management Note (Unsigned)
    Page 1 of 1   02/25/2014     11:16:07 AM CARE MANAGEMENT NOTE 02/25/2014  Patient:  STAISHA, WINIARSKI   Account Number:  [id number]  Date Initiated:  02/23/2014  Documentation initiated by:  Olga Coaster  Subjective/Objective Assessment:   ADMITTED WITH SAH     Action/Plan:   CM FOLLOWING FOR DCP   Anticipated DC Date:  02/26/2014   Anticipated DC Plan:  SKILLED NURSING FACILITY  In-house referral  Clinical Social Worker      DC Planning Services  CM consult      Choice offered to / List presented to:             Status of service:  In process, will continue to follow Medicare Important Message given?  YES (If response is "NO", the following Medicare IM given date fields will be blank) Date Medicare IM given:  02/23/2014 Medicare IM given by:  Olga Coaster Date Additional Medicare IM given:  02/25/2014 Additional Medicare IM given by:  Lorne Skeens  Discharge Disposition:  [place]  Per UR Regulation:  Reviewed for med. necessity/level of care/duration of stay  If discussed at [place] of Stay Meetings, dates discussed:    Comments:  02/25/14 [place], MSN, CM- Met with patient to provide additional Medicare IM letter.  Patient with previous letter at bedside, declines additional copy. Rights were explained, and patient verbalized understanding.   02/23/2014- B CHANDLER RN,BSN,MHA

## 2014-02-25 NOTE — Progress Notes (Addendum)
[facility] PROGRESS NOTE  Dana Blackwell EXH:[id number] DOB: Oct 26, 1927 DOA: 02/19/2014 PCP: [name], MD  PCCM transfer to [facility]  7/5  Brief Narrative:  80 year old patient with h/o CVA, DM, CAD recent PCI/stents x2 (DES) to her LAD on 4/15 admitted on 02/19/14 after a fall vs syncope, resulting in acute subarachnoid hemorrhage as well as fracture of her first rib.  Admitted by PCCM, Neurosurgery has been following with conservative management. Repeat head CT has been stable. Both aspirin and Plavix have been on hold since admission. Transferred from PCCM to [facility] 7/5 [place] regarding restarting antiplatelet therapy. Also Cards following, ECHO with Elevated PA pressures, VQ and Le dopplers pending Disposition: SNF when stable   Assessment/Plan: 1. Sub arachnoid hemorrhage-Moderate to Large based on Admission CT -following fall vs Syncope -CTA no aneurysm noted -Neurosurgery following, conservative mgt -ASA/plavix on hold, per Neurosurgery  repeat CT head 7/6 with mild improvement in [facility] and slight increase in IV blood, per Dr Hal Neer from the brain stand point would be safest just to keep her off all anti-coagulation for a few weeks, including her asa, and recheck a CT at that time.  - will discuss with NS & CARDS when OK to dc/ from their standpoint  2. CAD -recent PCI and stenting of LAD with DES x2 in 4/15 -plavix and ASA on hold due to 1, Cards consult appreciated -Continue metoprolol/statin -resume ASA or plavix or both when ok with Neurosurgery>> per Dr Sande Rives 7/7 note from the brain stand point, would be safest just to keep her off all anti-coagulation for a few weeks, including her asa, and recheck a CT at that time as above.    3. Syncope vs Fall -2D ECHO with grade 2 DD and elevated PA pressure -See below -Event monitor per Cards @ DC  4. Elevated PA pressure -VQ scan neg for PE, lower ext dopplers neg for VTE -likely from COPD, ? OSA  5.  DM -stable -continue lantus, SSI  6. HTN -better control -continue  clonidine, lisinopril imdur, metoprolol -hydralazine PRN  7. GERD -continue PPI  8. Hypothyroidism -continue synthroid  9. COPD -stable, on Home O2  10. Right first rib fx  -incentive spirometry, supportive care -continue pain management.  11. Constipation -start on senekot and prn dulcolax  DVT proph: SCDs  Code Status: Full Code Family Communication: none at bedside Disposition Plan: SNF recommended by PT/Ot when stable   Consultants:  Neurosurgery  cardiology  HPI/Subjective: C/o with increased R. Sided rib pain after ambulating with PT today. States she had headache earlier but she feels better now.c/o constipation  Objective: Filed Vitals:   02/25/14 1346  BP: 106/43  Pulse: 57  Temp: 99.3 F (37.4 C)  Resp: 18    Intake/Output Summary (Last 24 hours) at 02/25/14 1858 Last data filed at 02/25/14 1846  Gross per 24 hour  Intake    440 ml  Output      0 ml  Net    440 ml   Filed Weights   02/19/14 1426 02/20/14 0500  Weight: 66.679 kg (147 lb) 66.6 kg (146 lb 13.2 oz)    Exam:   General:  AAOx3, no distress  Cardiovascular: S1S2/RRR  Respiratory: CTAB  Abdomen: soft, NT, BS present  Musculoskeletal: no edema c/c  NEuro: non focal   Data Reviewed: Basic Metabolic Panel:  Recent Labs Lab 02/19/14 1505 02/20/14 0250 02/21/14 0240 02/22/14 0442  NA 137 137 140 139  K 4.8 5.4*  5.0 4.5  CL 98 100 102 99  CO2 27 28 27 28   GLUCOSE 175* 231* 154* 178*  BUN 23 19 22 21   CREATININE 1.20* 1.14* 1.11* 1.20*  CALCIUM 8.9 8.6 8.7 9.0   Liver Function Tests: No results found for this basename: AST, ALT, ALKPHOS, BILITOT, PROT, ALBUMIN,  in the last 168 hours No results found for this basename: LIPASE, AMYLASE,  in the last 168 hours No results found for this basename: AMMONIA,  in the last 168 hours CBC:  Recent Labs Lab 02/19/14 1505 02/20/14 0250  02/21/14 0240 02/22/14 0442  WBC 7.7 9.5 10.0 9.6  NEUTROABS 5.5  --   --   --   HGB 9.9* 8.6* 8.6* 9.2*  HCT 32.6* 27.7* 28.0* 30.5*  MCV 92.6 92.3 92.7 93.0  PLT 279 265 254 254   Cardiac Enzymes:  Recent Labs Lab 02/19/14 1505  TROPONINI <0.30   BNP (last 3 results) No results found for this basename: PROBNP,  in the last 8760 hours CBG:  Recent Labs Lab 02/24/14 1720 02/24/14 2209 02/25/14 0617 02/25/14 1152 02/25/14 1630  GLUCAP 292* 155* 167* 139* 204*    Recent Results (from the past 240 hour(s))  URINE CULTURE     Status: None   Collection Time    02/19/14  3:23 PM      Result Value Ref Range Status   Specimen Description URINE, CLEAN CATCH   Final   Special Requests NONE   Final   Culture  Setup Time     Final   Value: 02/19/2014 15:52     Performed at [place]     Final   Value: 15,000 COLONIES/ML     Performed at [facility]   Culture     Final   Value: LACTOBACILLUS SPECIES     Note: Standardized susceptibility testing for this organism is not available.     Performed at [facility]   Report Status 02/20/2014 FINAL   Final  MRSA PCR SCREENING     Status: None   Collection Time    02/19/14  9:20 PM      Result Value Ref Range Status   MRSA by PCR NEGATIVE  NEGATIVE Final   Comment:            The GeneXpert MRSA Assay (FDA     approved for NASAL specimens     only), is one component of a     comprehensive MRSA colonization     surveillance program. It is not     intended to diagnose MRSA     infection nor to guide or     monitor treatment for     MRSA infections.     Studies: Nm Pulmonary Perf And Vent  02/24/2014   CLINICAL DATA:  elevated PA pressure and high d dimer  EXAM: NUCLEAR MEDICINE VENTILATION - PERFUSION LUNG SCAN  TECHNIQUE: Ventilation images were obtained in multiple projections using inhaled aerosol technetium 99 M DTPA. Perfusion images were obtained in multiple projections after  intravenous injection of Tc-67m MAA.  RADIOPHARMACEUTICALS:  40 mCi Tc-85m DTPA aerosol and 6 mCi Tc-52m MAA  COMPARISON:  Portable chest radiograph dated 02/19/2014.  FINDINGS: Ventilation: No focal ventilation defect. There is blunting of the costophrenic angles.  Perfusion: No wedge shaped peripheral perfusion defects to suggest acute pulmonary embolism. Matched mild blunting of the costophrenic angles.  IMPRESSION: Low probability nuclear medicine ventilation perfusion scan for  pulmonary embolus.   Electronically Signed   By: Margaree Mackintosh M.D.   On: 02/24/2014 17:28    Scheduled Meds: . atorvastatin  20 mg Oral q1800  . cloNIDine  0.1 mg Oral BID  . insulin aspart  0-15 Units Subcutaneous TID WC  . insulin aspart  0-5 Units Subcutaneous QHS  . insulin glargine  10 Units Subcutaneous QHS  . isosorbide mononitrate  30 mg Oral Daily  . levothyroxine  75 mcg Oral QAC breakfast  . lidocaine  1 patch Transdermal Q24H  . lisinopril  20 mg Oral Daily  . metoprolol succinate  100 mg Oral Daily  . pantoprazole  40 mg Oral Daily  . senna-docusate  1 tablet Oral Daily  . sertraline  50 mg Oral Daily   Continuous Infusions:  Antibiotics Given (last 72 hours)   None      Active Problems:   DM, TYPE II   CAD 3V at cath 2006- medical Rx   Traumatic SAH (small)   Syncope   Pulmonary HTN    Time spent: 53min    Oakleigh Hesketh C  [facility] Pager [phone]. If 7PM-7AM, please contact night-coverage at [email or web address], password [facility] 02/25/2014, 6:58 PM  LOS: 6 days

## 2014-02-26 ENCOUNTER — Other Ambulatory Visit: Payer: Self-pay | Admitting: Cardiology

## 2014-02-26 DIAGNOSIS — R55 Syncope and collapse: Secondary | ICD-10-CM

## 2014-02-26 LAB — BASIC METABOLIC PANEL
ANION GAP: 9 (ref 5–15)
BUN: 30 mg/dL — ABNORMAL HIGH (ref 6–23)
CALCIUM: 8.7 mg/dL (ref 8.4–10.5)
CO2: 29 meq/L (ref 19–32)
Chloride: 99 mEq/L (ref 96–112)
Creatinine, Ser: 1.13 mg/dL — ABNORMAL HIGH (ref 0.50–1.10)
GFR calc Af Amer: 51 mL/min — ABNORMAL LOW (ref >90)
GFR calc non Af Amer: 44 mL/min — ABNORMAL LOW (ref >90)
Glucose, Bld: 105 mg/dL — ABNORMAL HIGH (ref 70–99)
Potassium: 4.3 mEq/L (ref 3.7–5.3)
Sodium: 137 mEq/L (ref 137–147)

## 2014-02-26 LAB — GLUCOSE, CAPILLARY
GLUCOSE-CAPILLARY: 113 mg/dL — AB (ref 70–99)
GLUCOSE-CAPILLARY: 90 mg/dL (ref 70–99)
GLUCOSE-CAPILLARY: 175 mg/dL — AB (ref 70–99)
Glucose-Capillary: 212 mg/dL — ABNORMAL HIGH (ref 70–99)

## 2014-02-26 NOTE — Discharge Instructions (Signed)
Go by Dr. Antionette Char office on Monday the 13th and they will place a portable monitor on you to see if irregular Heart beat caused you to pass out.

## 2014-02-26 NOTE — Progress Notes (Addendum)
Notified Dr. Burt Knack of events with syncope and intracranial bleed.  Per Dr. Marlyce Huge Neurosurgery wants to repeat scan in 2-3 weeks to determined residual bleed and for now request patient remain of ASA/Plavix.  She has an appt with Dr. Burt Knack on 8/7 and he will address restarting DAPT at that time.  Ok to d/c home from cardiac standpoint.  Will set up oupt event monitor.

## 2014-02-26 NOTE — Clinical Social Work Note (Signed)
Pt has completed admissions paperwork for [facility] and has received SNF authorization from [facility]. CSW awaiting medical stability for discharge.  CSW will continue to follow and assist with discharge needs once pt is medically stable for discharge.  Lubertha Sayres, MSW, [facility] Licensed Clinical Social Worker [phone] and [phone]

## 2014-02-26 NOTE — Progress Notes (Signed)
Pt reporting she has a HA and right sided neck pain.  She is fatigued and does not want to work with PT despite max encouragement.  PT to check back in AM per pt request. Read, reviewed, edited and agree with student's findings and recommendations.  Barbarann Ehlers [place], [place], DPT [phone]

## 2014-02-26 NOTE — Progress Notes (Signed)
PT Cancellation Note  Patient Details Name: Dana Blackwell MRN: [id number] DOB: 1927/12/17   Cancelled Treatment:    Reason Eval/Treat Not Completed: Patient declined, no reason specified   Eber Jones, SPT [phone]

## 2014-02-26 NOTE — Progress Notes (Signed)
Patient seen and examined and agree with note as outlined by Cecilie Kicks PA-C.

## 2014-02-26 NOTE — Progress Notes (Addendum)
[facility] PROGRESS NOTE  Dana Blackwell YKD:[id number] DOB: Apr 29, 1928 DOA: 02/19/2014 PCP: [name], MD  PCCM transfer to [facility]  7/5  Brief Narrative:  80 year old patient with h/o CVA, DM, CAD recent PCI/stents x2 (DES) to her LAD on 4/15 admitted on 02/19/14 after a fall vs syncope, resulting in acute subarachnoid hemorrhage as well as fracture of her first rib.  Admitted by PCCM, Neurosurgery has been following with conservative management. Repeat head CT has been stable. Both aspirin and Plavix have been on hold since admission. Transferred from PCCM to [facility] 7/5 [place] regarding restarting antiplatelet therapy. Also Cards following, ECHO with Elevated PA pressures, VQ and Le dopplers pending Disposition: SNF when stable   Assessment/Plan: 1. Sub arachnoid hemorrhage-Moderate to Large based on Admission CT -following fall vs Syncope -CTA no aneurysm noted -Neurosurgery following, conservative mgt -ASA/plavix on hold, per Neurosurgery  repeat CT head 7/6 with mild improvement in [facility] and slight increase in IV blood, per Dr Hal Neer from the brain stand point would be safest just to keep her off all anti-coagulation for a few weeks, including her asa, and recheck a CT at that time.  - appreciate CARDS input, discussed with Dr Radford Pax and she wants timing of next repeat surgery clarified per NS, I have called/paged Dr Hal Neer and still awaiting call back at this time    2. CAD -recent PCI and stenting of LAD with DES x2 in 4/15 -plavix and ASA on hold due to 1, Cards consult appreciated -Continue metoprolol/statin -resume ASA or plavix or both when ok with Neurosurgery>> per Dr Sande Rives 7/7 note from the brain stand point, would be safest just to keep her off all anti-coagulation for a few weeks, including her asa, and recheck a CT at that time as above.    3. Syncope vs Fall -2D ECHO with grade 2 DD and elevated PA pressure -See below -Event monitor per  Cards @ DC  4. Elevated PA pressure -VQ scan neg for PE, lower ext dopplers neg for VTE -likely from COPD, ? OSA  5. DM -stable -continue lantus, SSI  6. HTN -better control -continue  clonidine, lisinopril imdur, metoprolol -hydralazine PRN  7. GERD -continue PPI  8. Hypothyroidism -continue synthroid  9. COPD -stable, on Home O2  10. Right first rib fx  -incentive spirometry, supportive care -continue pain management.  11. Constipation -start on senekot and prn dulcolax  DVT proph: SCDs  Code Status: Full Code Family Communication: none at bedside Disposition Plan: plan SNF once ok with NS   Consultants:  Neurosurgery  cardiology  HPI/Subjective: States she feels better today, no new complaints  Objective: Filed Vitals:   02/26/14 1348  BP: 116/50  Pulse: 58  Temp: 97.8 F (36.6 C)  Resp: 20    Intake/Output Summary (Last 24 hours) at 02/26/14 1657 Last data filed at 02/26/14 1300  Gross per 24 hour  Intake    720 ml  Output      0 ml  Net    720 ml   Filed Weights   02/19/14 1426 02/20/14 0500  Weight: 66.679 kg (147 lb) 66.6 kg (146 lb 13.2 oz)    Exam:   General:  AAOx3, no distress  Cardiovascular: S1S2/RRR  Respiratory: CTAB  Abdomen: soft, NT, BS present  Musculoskeletal: no edema c/c  NEuro: non focal   Data Reviewed: Basic Metabolic Panel:  Recent Labs Lab 02/20/14 0250 02/21/14 0240 02/22/14 0442 02/26/14 0546  NA 137 140 139  137  K 5.4* 5.0 4.5 4.3  CL 100 102 99 99  CO2 28 27 28 29   GLUCOSE 231* 154* 178* 105*  BUN 19 22 21  30*  CREATININE 1.14* 1.11* 1.20* 1.13*  CALCIUM 8.6 8.7 9.0 8.7   Liver Function Tests: No results found for this basename: AST, ALT, ALKPHOS, BILITOT, PROT, ALBUMIN,  in the last 168 hours No results found for this basename: LIPASE, AMYLASE,  in the last 168 hours No results found for this basename: AMMONIA,  in the last 168 hours CBC:  Recent Labs Lab 02/20/14 0250  02/21/14 0240 02/22/14 0442  WBC 9.5 10.0 9.6  HGB 8.6* 8.6* 9.2*  HCT 27.7* 28.0* 30.5*  MCV 92.3 92.7 93.0  PLT 265 254 254   Cardiac Enzymes: No results found for this basename: CKTOTAL, CKMB, CKMBINDEX, TROPONINI,  in the last 168 hours BNP (last 3 results) No results found for this basename: PROBNP,  in the last 8760 hours CBG:  Recent Labs Lab 02/25/14 1630 02/25/14 2116 02/26/14 0632 02/26/14 1148 02/26/14 1628  GLUCAP 204* 240* 113* 212* 90    Recent Results (from the past 240 hour(s))  URINE CULTURE     Status: None   Collection Time    02/19/14  3:23 PM      Result Value Ref Range Status   Specimen Description URINE, CLEAN CATCH   Final   Special Requests NONE   Final   Culture  Setup Time     Final   Value: 02/19/2014 15:52     Performed at [facility]     Final   Value: 15,000 COLONIES/ML     Performed at [facility]   Culture     Final   Value: LACTOBACILLUS SPECIES     Note: Standardized susceptibility testing for this organism is not available.     Performed at [facility]   Report Status 02/20/2014 FINAL   Final  MRSA PCR SCREENING     Status: None   Collection Time    02/19/14  9:20 PM      Result Value Ref Range Status   MRSA by PCR NEGATIVE  NEGATIVE Final   Comment:            The GeneXpert MRSA Assay (FDA     approved for NASAL specimens     only), is one component of a     comprehensive MRSA colonization     surveillance program. It is not     intended to diagnose MRSA     infection nor to guide or     monitor treatment for     MRSA infections.     Studies: Nm Pulmonary Perf And Vent  02/24/2014   CLINICAL DATA:  elevated PA pressure and high d dimer  EXAM: NUCLEAR MEDICINE VENTILATION - PERFUSION LUNG SCAN  TECHNIQUE: Ventilation images were obtained in multiple projections using inhaled aerosol technetium 99 M DTPA. Perfusion images were obtained in multiple projections after intravenous  injection of Tc-82m MAA.  RADIOPHARMACEUTICALS:  40 mCi Tc-32m DTPA aerosol and 6 mCi Tc-22m MAA  COMPARISON:  Portable chest radiograph dated 02/19/2014.  FINDINGS: Ventilation: No focal ventilation defect. There is blunting of the costophrenic angles.  Perfusion: No wedge shaped peripheral perfusion defects to suggest acute pulmonary embolism. Matched mild blunting of the costophrenic angles.  IMPRESSION: Low probability nuclear medicine ventilation perfusion scan for pulmonary embolus.   Electronically Signed   By:  Margaree Mackintosh M.D.   On: 02/24/2014 17:28    Scheduled Meds: . atorvastatin  20 mg Oral q1800  . cloNIDine  0.1 mg Oral BID  . insulin aspart  0-15 Units Subcutaneous TID WC  . insulin aspart  0-5 Units Subcutaneous QHS  . insulin glargine  10 Units Subcutaneous QHS  . isosorbide mononitrate  30 mg Oral Daily  . levothyroxine  75 mcg Oral QAC breakfast  . lidocaine  1 patch Transdermal Q24H  . lisinopril  20 mg Oral Daily  . metoprolol succinate  100 mg Oral Daily  . pantoprazole  40 mg Oral Daily  . senna-docusate  1 tablet Oral Daily  . sertraline  50 mg Oral Daily   Continuous Infusions:  Antibiotics Given (last 72 hours)   None      Active Problems:   DM, TYPE II   CAD 3V at cath 2006- medical Rx   Traumatic SAH (small)   Syncope   Pulmonary HTN    Time spent: 60min    [place]usalem Wert C  [facility] Pager [phone]. If 7PM-7AM, please contact night-coverage at [email or web address], password [facility] 02/26/2014, 4:57 PM  LOS: 7 days

## 2014-02-26 NOTE — Progress Notes (Signed)
Occupational Therapy Treatment Patient Details Name: Dana Blackwell MRN: [id number] DOB: June 08, 1928 Today's Date: 02/26/2014    History of present illness 80 yo female with CAD, HTN, CVA, DM, COPD presents to [facility] ED 7/2 s/p mechanical fall. In ED found to have acute SAH. CT of head showed Moderate to advanced subarachnoid hemorrhage including significant blood in the basilar cisterns.    OT comments  Pt with improving ability to perform BADLs, but demonstrates impaired safety awareness and impaired problem solving - unsure of pt's baseline.  She requires min - mod A for BADLs, and requires close supervision/assist when up.  She will continue to need SNF level rehab at discharge  Follow Up Recommendations  SNF;Supervision/Assistance - 24 hour    Equipment Recommendations  None recommended by OT    Recommendations for Other Services      Precautions / Restrictions Precautions Precautions: Fall Precaution Comments: pt on 2L O2 at home       Mobility Bed Mobility Overal bed mobility: Needs Assistance Bed Mobility: Supine to Sit;Sit to Supine     Supine to sit: Min guard Sit to supine: Min assist   General bed mobility comments: Pt insistent on entering bed forward and crawling in on her knees resulting in significant pain.  Would not change plan even when it was clear that this wasn't a good option and was causing pain   Transfers Overall transfer level: Needs assistance Equipment used: Rolling walker (2 wheeled) Transfers: Sit to/from [facility] Sit to Stand: Min assist Stand pivot transfers: Min guard       General transfer comment: verbal cues for safety     Balance Overall balance assessment: Needs assistance Sitting-balance support: Feet supported Sitting balance-Leahy Scale: Good     Standing balance support: Single extremity supported Standing balance-Leahy Scale: Fair                     ADL Overall ADL's : Needs  assistance/impaired     Grooming: Wash/dry hands;Wash/dry face;Brushing hair;Min guard;Standing       Lower Body Bathing: Moderate assistance;Sit to/from stand           Toilet Transfer: Min guard;Ambulation;BSC Toilet Transfer Details (indicate cue type and reason): verbal cues for safety  Toileting- Clothing Manipulation and Hygiene: Moderate assistance;Sit to/from stand Toileting - Clothing Manipulation Details (indicate cue type and reason): Pt incontinent of urine.  Required verbal cues for peri care and assist to wash posterior peri area              Vision                     Perception     Praxis      Cognition   Behavior During Therapy: [facility] for tasks assessed/performed Overall Cognitive Status: No family/caregiver present to determine baseline cognitive functioning Area of Impairment: Safety/judgement;Awareness;Problem solving     Memory: Decreased short-term memory    Safety/Judgement: Decreased awareness of safety Awareness: Emergent Problem Solving: Slow processing;Difficulty sequencing;Requires verbal cues;Requires tactile cues General Comments: Pt requires mod verbal cues for problem solving and safety during BADLs    Extremity/Trunk Assessment               Exercises     Shoulder Instructions       General Comments      Pertinent Vitals/ Pain       See vitals flow sheet  Home Living  Prior Functioning/Environment              Frequency Min 3X/week     Progress Toward Goals  OT Goals(current goals can now be found in the care plan section)  Progress towards OT goals: Progressing toward goals  ADL Goals Pt Will Perform Grooming: with supervision;standing Pt Will Perform Upper Body Bathing: with set-up;with supervision;sitting Pt Will Perform Lower Body Bathing: with supervision;sit to/from stand Pt Will Perform Upper Body Dressing: with  supervision;sitting Pt Will Perform Lower Body Dressing: with supervision;sit to/from stand;with adaptive equipment Pt Will Transfer to Toilet: with supervision;ambulating;regular height toilet;grab bars Pt Will Perform Toileting - Clothing Manipulation and hygiene: with supervision;sit to/from stand Pt Will Perform Tub/Shower Transfer: Tub transfer;with supervision;shower seat;rolling walker;grab bars  Plan Discharge plan remains appropriate    Co-evaluation                 End of Session Equipment Utilized During Treatment: Back brace;Oxygen   Activity Tolerance Patient limited by fatigue   Patient Left in bed;with call bell/phone within reach;with bed alarm set   Nurse Communication          Time: [phone] OT Time Calculation (min): 32 min  Charges: OT General Charges $OT Visit: 1 Procedure OT Treatments $Self Care/Home Management : 23-37 mins  Tryson Lumley M 02/26/2014, 3:41 PM

## 2014-02-26 NOTE — Progress Notes (Signed)
UR COMPLETED  

## 2014-02-26 NOTE — Progress Notes (Signed)
Subjective: No complaints, when asked she admits to pain at rib fx.  She does complain of constipation  Objective: Vital signs in last 24 hours: Temp:  [97.3 F (36.3 C)-99.3 F (37.4 C)] 97.4 F (36.3 C) (07/09 0528) Pulse Rate:  [52-64] 58 (07/09 0528) Resp:  [18] 18 (07/09 0528) BP: (106-153)/(43-84) 141/68 mmHg (07/09 0528) SpO2:  [98 %-100 %] 98 % (07/09 0528) Weight change:  Last BM Date: 02/22/14 Intake/Output from previous day: +440 07/08 0701 - 07/09 0700 In: 440 [P.O.:440] Out: -  Intake/Output this shift: Total I/O In: 240 [P.O.:240] Out: -   PE:  General:Pleasant affect, NAD Skin:Warm and dry, brisk capillary refill HEENT:normocephalic, sclera clear, mucus membranes moist Heart:S1S2 RRR without murmur, gallup, rub or click Lungs:clear, ant without rales, rhonchi, or wheezes TDS:KAJG, non tender, + BS, do not palpate liver spleen or masses Ext:no lower ext edema, 2+ pedal pulses, 2+ radial pulses Neuro:alert and oriented, MAE, follows commands, + facial symmetry, though slow to answer questions.  Lab Results: No results found for this basename: WBC, HGB, HCT, PLT,  in the last 72 hours BMET  Recent Labs  02/26/14 0546  NA 137  K 4.3  CL 99  CO2 29  GLUCOSE 105*  BUN 30*  CREATININE 1.13*  CALCIUM 8.7   No results found for this basename: TROPONINI, CK, MB,  in the last 72 hours  Lab Results  Component Value Date   CHOL  Value: 138        ATP III CLASSIFICATION:  <200     mg/dL   Desirable  200-239  mg/dL   Borderline High  >=240    mg/dL   High        05/28/2010   HDL 40 05/28/2010   LDLCALC  Value: 62        Total Cholesterol/HDL:CHD Risk Coronary Heart Disease Risk Table                     Men   Women  1/2 Average Risk   3.4   3.3  Average Risk       5.0   4.4  2 X Average Risk   9.6   7.1  3 X Average Risk  23.4   11.0        Use the calculated Patient Ratio above and the CHD Risk Table to determine the patient's CHD Risk.        ATP  III CLASSIFICATION (LDL):  <100     mg/dL   Optimal  100-129  mg/dL   Near or Above                    Optimal  130-159  mg/dL   Borderline  160-189  mg/dL   High  >190     mg/dL   Very High 05/28/2010   TRIG 179* 05/28/2010   CHOLHDL 3.5 05/28/2010   Lab Results  Component Value Date   HGBA1C 6.8* 11/28/2013     Lab Results  Component Value Date   TSH 0.511 05/28/2010    Hepatic Function Panel No results found for this basename: PROT, ALBUMIN, AST, ALT, ALKPHOS, BILITOT, BILIDIR, IBILI,  in the last 72 hours No results found for this basename: CHOL,  in the last 72 hours No results found for this basename: PROTIME,  in the last 72 hours     Studies/Results: Nm Pulmonary Perf And Vent  02/24/2014   CLINICAL DATA:  elevated PA pressure and high d dimer  EXAM: NUCLEAR MEDICINE VENTILATION - PERFUSION LUNG SCAN  TECHNIQUE: Ventilation images were obtained in multiple projections using inhaled aerosol technetium 99 M DTPA. Perfusion images were obtained in multiple projections after intravenous injection of Tc-35m MAA.  RADIOPHARMACEUTICALS:  40 mCi Tc-54m DTPA aerosol and 6 mCi Tc-81m MAA  COMPARISON:  Portable chest radiograph dated 02/19/2014.  FINDINGS: Ventilation: No focal ventilation defect. There is blunting of the costophrenic angles.  Perfusion: No wedge shaped peripheral perfusion defects to suggest acute pulmonary embolism. Matched mild blunting of the costophrenic angles.  IMPRESSION: Low probability nuclear medicine ventilation perfusion scan for pulmonary embolus.   Electronically Signed   By: Margaree Mackintosh M.D.   On: 02/24/2014 17:28    Medications: I have reviewed the patient's current medications. Scheduled Meds: . atorvastatin  20 mg Oral q1800  . cloNIDine  0.1 mg Oral BID  . insulin aspart  0-15 Units Subcutaneous TID WC  . insulin aspart  0-5 Units Subcutaneous QHS  . insulin glargine  10 Units Subcutaneous QHS  . isosorbide mononitrate  30 mg Oral Daily  . levothyroxine   75 mcg Oral QAC breakfast  . lidocaine  1 patch Transdermal Q24H  . lisinopril  20 mg Oral Daily  . metoprolol succinate  100 mg Oral Daily  . pantoprazole  40 mg Oral Daily  . senna-docusate  1 tablet Oral Daily  . sertraline  50 mg Oral Daily   Continuous Infusions:  PRN Meds:.ALPRAZolam, bisacodyl, hydrALAZINE, HYDROcodone-acetaminophen, ondansetron (ZOFRAN) IV, traMADol, zolpidem  Assessment/Plan: 80 year old female with coronary artery disease status post rotational atherectomy of LAD with 2 drug-eluting stent placement in April of 2015 who suffered a mechanical fall and subarachnoid hemorrhage  1) She needs antiplatelet therapy especially given the temporal nature of her stent placement but given increased blood on repeat head CT Neurosurgery wants to wait- a few weeks. Will need to restart aspirin 81 mg and Plavix when felt comfortable from a neurosurgical perspective. Obviously, the risks and benefits of the timing the restart of antiplatelet therapy must be heavily weighed in the light of recent subarachnoid hemorrhage. Without dual antiplatelet therapy, she is at increased risk for stent thrombosis. She understands this.  2. Coronary artery disease-as described above. Stable, no symptoms.  3. Fall-likely mechanical fall but unknown. She was taking out the garbage with her walker and turned around, felt a sensation like she was unable to take in a deep breath she thinks that was the last she remembers. Questionable arrhythmia. I would recommended event monitor on discharge. Not on tele. 4. D-Dimer elevated and VQ scan low prob for PE.  5. Severe pulmonary HTN with PASP 44mmHg of unclear etiology. Most likely etiology is COPD and this can be worked up as an Charity fundraiser.  6. HTN improved after increasing Lisinopril  7, CKD 3- improving Cr.   8- constipation add miralax   LOS: 7 days   Time spent with pt. :15 minutes. [facility] R  Nurse Practitioner Certified Pager [phone] or after  5pm and on weekends call [phone] 02/26/2014, 9:33 AM

## 2014-02-27 ENCOUNTER — Telehealth: Payer: Self-pay | Admitting: Cardiology

## 2014-02-27 ENCOUNTER — Other Ambulatory Visit: Payer: Self-pay | Admitting: Cardiology

## 2014-02-27 ENCOUNTER — Encounter (HOSPITAL_COMMUNITY): Payer: Medicare HMO

## 2014-02-27 DIAGNOSIS — R55 Syncope and collapse: Secondary | ICD-10-CM

## 2014-02-27 DIAGNOSIS — E039 Hypothyroidism, unspecified: Secondary | ICD-10-CM

## 2014-02-27 LAB — GLUCOSE, CAPILLARY
Glucose-Capillary: 210 mg/dL — ABNORMAL HIGH (ref 70–99)
Glucose-Capillary: 163 mg/dL — ABNORMAL HIGH (ref 70–99)
Glucose-Capillary: 102 mg/dL — ABNORMAL HIGH (ref 70–99)

## 2014-02-27 MED ORDER — ALPRAZOLAM 0.25 MG PO TABS: 0.2500 mg | ORAL_TABLET | Freq: Three times a day (TID) | ORAL | Status: DC | PRN

## 2014-02-27 MED ORDER — LIDOCAINE 5 % EX PTCH: 1.0000 | MEDICATED_PATCH | CUTANEOUS | Status: DC

## 2014-02-27 MED ORDER — TRAMADOL HCL 50 MG PO TABS: 50.0000 mg | ORAL_TABLET | Freq: Four times a day (QID) | ORAL | Status: AC | PRN

## 2014-02-27 MED ORDER — HYDROCODONE-ACETAMINOPHEN 5-325 MG PO TABS: 1.0000 | ORAL_TABLET | Freq: Four times a day (QID) | ORAL | Status: AC | PRN

## 2014-02-27 MED ORDER — SENNOSIDES-DOCUSATE SODIUM 8.6-50 MG PO TABS: 1.0000 | ORAL_TABLET | Freq: Every day | ORAL | Status: DC

## 2014-02-27 MED ORDER — BISACODYL 5 MG PO TBEC: 10.0000 mg | DELAYED_RELEASE_TABLET | Freq: Every day | ORAL | Status: DC | PRN

## 2014-02-27 MED ORDER — BISACODYL 10 MG RE SUPP
10.0000 mg | Freq: Once | RECTAL | Status: AC
  Administered 2014-02-27: 10 mg via RECTAL
  Filled 2014-02-27: qty 1

## 2014-02-27 MED ORDER — HEART RATE MONITOR MISC: 1.0000 | Status: DC

## 2014-02-27 MED ORDER — CLONIDINE HCL 0.1 MG PO TABS: 0.1000 mg | ORAL_TABLET | Freq: Two times a day (BID) | ORAL | Status: DC

## 2014-02-27 MED ORDER — FLEET ENEMA 7-19 GM/118ML RE ENEM
1.0000 | ENEMA | Freq: Once | RECTAL | Status: AC
  Administered 2014-02-27: 1 via RECTAL
  Filled 2014-02-27: qty 1

## 2014-02-27 NOTE — Progress Notes (Signed)
Patient forgot some of her belonging and the POA was called and notified about it. Belongings kept in a safe place as per policy

## 2014-02-27 NOTE — Progress Notes (Signed)
Patient complaining about constipation obtained order for Dulcolax suppository, patient was not relived obtained an additional order for fleet enema, patient was impacted. After manual stimulation and enema a large fecal mass was passed. Patient still has significant fecal obstruction but she was not able to continue due to nausea and severe fatigue. Will report findings to day shift and suggest they make another attempt to disimpact. Patient is resting now and I will continue to monitor.

## 2014-02-27 NOTE — Progress Notes (Signed)
SUBJECTIVE:  Denies CP  OBJECTIVE:   Vitals:   Filed Vitals:   02/26/14 2200 02/27/14 0225 02/27/14 0609 02/27/14 0642  BP: 155/54 153/64 147/60 140/59  Pulse: 59 75 83 85  Temp: 98.4 F (36.9 C) 98.4 F (36.9 C) 98 F (36.7 C) 98.4 F (36.9 C)  TempSrc: Oral Oral Oral Oral  Resp: 20 22 18    Height:      Weight:      SpO2: 99% 99% 100% 99%   I&O's:   Intake/Output Summary (Last 24 hours) at 02/27/14 0930 Last data filed at 02/26/14 1803  Gross per 24 hour  Intake    480 ml  Output      0 ml  Net    480 ml     PHYSICAL EXAM General: Well developed, well nourished, in no acute distress Head: Eyes PERRLA, No xanthomas.   Normal cephalic and atramatic  Lungs:   Clear bilaterally to auscultation and percussion. Heart:   HRRR S1 S2 Pulses are 2+ & equal. Abdomen: Bowel sounds are positive, abdomen soft and non-tender without masses  Extremities:   No clubbing, cyanosis or edema.  DP +1 Neuro: Alert and oriented X 3. Psych:  Good affect, responds appropriately   LABS: Basic Metabolic Panel:  Recent Labs  02/26/14 0546  NA 137  K 4.3  CL 99  CO2 29  GLUCOSE 105*  BUN 30*  CREATININE 1.13*  CALCIUM 8.7   Liver Function Tests: No results found for this basename: AST, ALT, ALKPHOS, BILITOT, PROT, ALBUMIN,  in the last 72 hours No results found for this basename: LIPASE, AMYLASE,  in the last 72 hours CBC: No results found for this basename: WBC, NEUTROABS, HGB, HCT, MCV, PLT,  in the last 72 hours Cardiac Enzymes: No results found for this basename: CKTOTAL, CKMB, CKMBINDEX, TROPONINI,  in the last 72 hours BNP: No components found with this basename: POCBNP,  D-Dimer: No results found for this basename: DDIMER,  in the last 72 hours Hemoglobin A1C: No results found for this basename: HGBA1C,  in the last 72 hours Fasting Lipid Panel: No results found for this basename: CHOL, HDL, LDLCALC, TRIG, CHOLHDL, LDLDIRECT,  in the last 72 hours Thyroid Function  Tests: No results found for this basename: TSH, T4TOTAL, FREET3, T3FREE, THYROIDAB,  in the last 72 hours Anemia Panel: No results found for this basename: VITAMINB12, FOLATE, FERRITIN, TIBC, IRON, RETICCTPCT,  in the last 72 hours Coag Panel:   Lab Results  Component Value Date   INR 1.15 02/20/2014   INR 1.04 02/19/2014   INR 1.01 11/24/2013    RADIOLOGY: Ct Angio Head W/cm &/or Wo Cm  02/19/2014   CLINICAL DATA:  Fall.  Intracranial hemorrhage.  EXAM: CT ANGIOGRAPHY HEAD  TECHNIQUE: Multidetector CT imaging of the head was performed using the standard protocol during bolus administration of intravenous contrast. Multiplanar CT image reconstructions and MIPs were obtained to evaluate the vascular anatomy.  CONTRAST:  57mL OMNIPAQUE IOHEXOL 350 MG/ML SOLN  COMPARISON:  CT head without contrast 02/19/2014.  FINDINGS: Dense atherosclerotic calcifications are present within the cavernous carotid arteries bilaterally. The lumen of the right internal carotid artery is narrowed to less than 1 mm. The lumen of the left internal carotid artery is narrowed to 1.2 mm. The ICA termini are intact bilaterally. The A1 and M1 segments are within normal limits. The MCA bifurcations are intact. There is no significant aneurysm within the anterior circulation. There is some attenuation of distal  ACA and MCA branch vessels without a significant proximal stenosis or occlusion.  The left vertebral artery is the dominant vessel. The PICA origins are visualized and normal bilaterally. The right vertebral artery terminates at the PICA. The left vertebral artery continues as the basilar artery with mild proximal narrowing. Both posterior cerebral arteries originate from the basilar tip. The PCA branch vessels are within normal limits bilaterally.  The dural sinuses are patent.  Cortical veins are intact.  Review of the MIP images confirms the above findings.  IMPRESSION: 1. No focal aneurysm to explain the patient's hemorrhage. 2.  Dense atherosclerotic calcifications with moderate stenoses in the cavernous ICA segments bilaterally. 3. Mild small vessel disease involving the ACA and MCA branch vessels bilaterally. No significant proximal stenosis or occlusion.   Electronically Signed   By: Lawrence Santiago M.D.   On: 02/19/2014 18:27   Ct Head Wo Contrast  02/23/2014   CLINICAL DATA:  Followup intracranial hemorrhage.  Fall.  EXAM: CT HEAD WITHOUT CONTRAST  TECHNIQUE: Contiguous axial images were obtained from the base of the skull through the vertex without intravenous contrast.  COMPARISON:  CT 02/20/2014  FINDINGS: High-density subarachnoid hemorrhage remains bilaterally with mild improvement. Slight increase in blood in the occipital horns bilaterally. No new area of hemorrhage identified. Ventricle size is unchanged from prior studies  Mild chronic microvascular ischemic change in the white matter. No acute infarct or mass identified.  IMPRESSION: Mild improvement in subarachnoid hemorrhage. Slight increase in intraventricular blood. Ventricle size is stable without significant hydrocephalus.   Electronically Signed   By: Franchot Gallo M.D.   On: 02/23/2014 11:00   Ct Head Wo Contrast  02/20/2014   CLINICAL DATA:  Followup traumatic brain injury  EXAM: CT HEAD WITHOUT CONTRAST  TECHNIQUE: Contiguous axial images were obtained from the base of the skull through the vertex without intravenous contrast.  COMPARISON:  02/19/2014  FINDINGS: No acute fracture. The mastoids, middle ears, and imaged paranasal sinuses remain aerated. No acute soft tissue findings in the orbits.  Similar degree of subarachnoid blood within sulci of the right more than left cerebral convexities. Hemorrhage is again noted within the basal cisterns. Intraventricular hemorrhage has increased and volume, layering in the occipital horns of the lateral ventricles. There is no evidence of hydrocephalus. No evidence of acute infarct, brain edema, shift, or herniation.  Lacunar spaces in the bilateral putamen are again noted.  IMPRESSION: 1. Stable volume of subarachnoid hemorrhage. 2. Increased, but still small volume, intraventricular hemorrhage at the lateral ventricles. No hydrocephalus.   Electronically Signed   By: Jorje Guild M.D.   On: 02/20/2014 04:58   Ct Head Wo Contrast  02/19/2014   CLINICAL DATA:  Fall  EXAM: CT HEAD WITHOUT CONTRAST  CT CERVICAL SPINE WITHOUT CONTRAST  TECHNIQUE: Multidetector CT imaging of the head and cervical spine was performed following the standard protocol without intravenous contrast. Multiplanar CT image reconstructions of the cervical spine were also generated.  COMPARISON:  None.  FINDINGS: CT HEAD FINDINGS  Moderate to large subarachnoid hemorrhage. There is high-density acute hemorrhage in the basilar cisterns extending into the right sylvian fissure. There is subarachnoid hemorrhage over the convexity bilaterally, right greater than left. No intraventricular hemorrhage. No hydrocephalus.  Negative for acute infarct or mass.  Negative for skull fracture.  CT CERVICAL SPINE FINDINGS  Fracture the right first rib without significant displacement. No apical hematoma identified.  1 mm anterior slip C4-5. Extensive disc degeneration and spondylosis at C3-4 with  right-sided facet degeneration. Bilateral facet degeneration at C4-5. Solid fusion at C5-6. Spondylosis at C6-7, C7-T1, and T1-2. Diffuse facet degeneration.  Negative for cervical spine fracture.  IMPRESSION: Moderate to advanced subarachnoid hemorrhage including significant blood in the basilar cisterns. This pattern could be seen with trauma however aneurysm rupture needs to be considered based on the amount of blood in the basilar cisterns.  Nondisplaced fracture right first rib.  Advanced cervical spondylosis.  No cervical spine fracture.  Carotid artery calcification bilaterally.  Critical Value/emergent results were called by telephone at the time of interpretation on  02/19/2014 at 4:16 PM to Dr. Daleen Bo , who verbally acknowledged these results.   Electronically Signed   By: Franchot Gallo M.D.   On: 02/19/2014 16:17   Ct Cervical Spine Wo Contrast  02/19/2014   CLINICAL DATA:  Fall  EXAM: CT HEAD WITHOUT CONTRAST  CT CERVICAL SPINE WITHOUT CONTRAST  TECHNIQUE: Multidetector CT imaging of the head and cervical spine was performed following the standard protocol without intravenous contrast. Multiplanar CT image reconstructions of the cervical spine were also generated.  COMPARISON:  None.  FINDINGS: CT HEAD FINDINGS  Moderate to large subarachnoid hemorrhage. There is high-density acute hemorrhage in the basilar cisterns extending into the right sylvian fissure. There is subarachnoid hemorrhage over the convexity bilaterally, right greater than left. No intraventricular hemorrhage. No hydrocephalus.  Negative for acute infarct or mass.  Negative for skull fracture.  CT CERVICAL SPINE FINDINGS  Fracture the right first rib without significant displacement. No apical hematoma identified.  1 mm anterior slip C4-5. Extensive disc degeneration and spondylosis at C3-4 with right-sided facet degeneration. Bilateral facet degeneration at C4-5. Solid fusion at C5-6. Spondylosis at C6-7, C7-T1, and T1-2. Diffuse facet degeneration.  Negative for cervical spine fracture.  IMPRESSION: Moderate to advanced subarachnoid hemorrhage including significant blood in the basilar cisterns. This pattern could be seen with trauma however aneurysm rupture needs to be considered based on the amount of blood in the basilar cisterns.  Nondisplaced fracture right first rib.  Advanced cervical spondylosis.  No cervical spine fracture.  Carotid artery calcification bilaterally.  Critical Value/emergent results were called by telephone at the time of interpretation on 02/19/2014 at 4:16 PM to Dr. Daleen Bo , who verbally acknowledged these results.   Electronically Signed   By: Franchot Gallo M.D.    On: 02/19/2014 16:17   Nm Pulmonary Perf And Vent  02/24/2014   CLINICAL DATA:  elevated PA pressure and high d dimer  EXAM: NUCLEAR MEDICINE VENTILATION - PERFUSION LUNG SCAN  TECHNIQUE: Ventilation images were obtained in multiple projections using inhaled aerosol technetium 99 M DTPA. Perfusion images were obtained in multiple projections after intravenous injection of Tc-30m MAA.  RADIOPHARMACEUTICALS:  40 mCi Tc-47m DTPA aerosol and 6 mCi Tc-64m MAA  COMPARISON:  Portable chest radiograph dated 02/19/2014.  FINDINGS: Ventilation: No focal ventilation defect. There is blunting of the costophrenic angles.  Perfusion: No wedge shaped peripheral perfusion defects to suggest acute pulmonary embolism. Matched mild blunting of the costophrenic angles.  IMPRESSION: Low probability nuclear medicine ventilation perfusion scan for pulmonary embolus.   Electronically Signed   By: Margaree Mackintosh M.D.   On: 02/24/2014 17:28   Dg Chest Port 1 View  02/19/2014   CLINICAL DATA:  The patient fainted today.  EXAM: PORTABLE CHEST - 1 VIEW  COMPARISON:  November 23, 2013 the mediastinal contour is normal. The heart size is enlarged. There is bilateral diffuse increased pulmonary interstitium. Minimal  bilateral pleural effusions are identified. The visualized skeletal structures are stable.  IMPRESSION: Mild congestive heart failure.   Electronically Signed   By: Abelardo Diesel M.D.   On: 02/19/2014 15:07   Assessment/Plan:  80 year old female with coronary artery disease status post rotational atherectomy of LAD with 2 drug-eluting stent placement in April of 2015 who suffered a mechanical fall and subarachnoid hemorrhage  1) She needs antiplatelet therapy especially given the temporal nature of her stent placement but given increased blood on repeat head CT Neurosurgery wants to wait- a few weeks. Will need to restart aspirin 81 mg and Plavix when felt comfortable from a neurosurgical perspective. Obviously, the risks and  benefits of the timing the restart of antiplatelet therapy must be heavily weighed in the light of recent subarachnoid hemorrhage. Without dual antiplatelet therapy, she is at increased risk for stent thrombosis. She understands this. Notified Dr. Burt Knack of events with syncope and intracranial bleed. Per Dr. Marlyce Huge Neurosurgery wants to repeat scan in 2-3 weeks to determined residual bleed and for now request patient remain of ASA/Plavix. She has an appt with Dr. Burt Knack on 8/7 and he will address restarting DAPT at that time. Ok to d/c home from cardiac standpoint. Will set up oupt event monitor. 2. Coronary artery disease-as described above. Stable, no symptoms.  3. Fall-likely mechanical fall but unknown. She was taking out the garbage with her walker and turned around, felt a sensation like she was unable to take in a deep breath she thinks that was the last she remembers. Questionable arrhythmia. I would recommended event monitor on discharge. Not on tele.  4. D-Dimer elevated and VQ scan low prob for PE.  5. Severe pulmonary HTN with PASP 73mmHg of unclear etiology. Most likely etiology is COPD and this can be worked up as an Charity fundraiser.  6. HTN improved after increasing Lisinopril  7, CKD 3- improving Cr.  8- constipation add miralax   Sueanne Margarita, MD  02/27/2014  9:30 AM

## 2014-02-27 NOTE — Progress Notes (Signed)
Nutrition Brief Note  Patient identified on the Malnutrition Screening Tool (MST) Report  Wt Readings from Last 15 Encounters:  02/20/14 146 lb 13.2 oz (66.6 kg)  12/25/13 145 lb 4.5 oz (65.9 kg)  12/05/13 143 lb (64.864 kg)  11/28/13 148 lb 9.4 oz (67.4 kg)  11/28/13 148 lb 9.4 oz (67.4 kg)  11/28/13 148 lb 9.4 oz (67.4 kg)  12/31/12 155 lb (70.308 kg)  01/09/12 149 lb 12.8 oz (67.949 kg)  01/04/11 150 lb 6.4 oz (68.221 kg)  07/11/10 145 lb 8 oz (65.998 kg)  06/28/10 142 lb (64.411 kg)  08/03/09 147 lb 4 oz (66.792 kg)  07/06/09 147 lb 8 oz (66.906 kg)  05/03/09 142 lb 6.1 oz (64.584 kg)    Pt admitted with SAH, left closed rib fx, and syncope.  SIGNIFICANT EVENTS / STUDIES:  7/2 - CT head/c spine > Subarachnoid hemorrhage including significant blood in the basilar cisterns. Trauma vs aneurysm. fracture R 1st rib.  7/2 - CTA head > No focal aneurysm to explain the patient's hemorrhage. Chronic small vessel disease changes.  CT on 7/6 revealed some increase in intra ventricular blood, but no hydrocephalus.  Abbreviated nutrition focused physical exam revealed no signs of fat or muscle depletion.  Plan is to d/c to [facility] once medically stable.  Body mass index is 28.68 kg/(m^2). Patient meets criteria for overweight based on current BMI.   Current diet order is carb modified, patient is consuming approximately 75-100% of meals at this time. Labs and medications reviewed.   No nutrition interventions warranted at this time. If nutrition issues arise, please consult RD.   Gorman Safi A. Jimmye Norman, RD, LDN Pager: [phone] After hours Pager: [phone]

## 2014-02-27 NOTE — Progress Notes (Signed)
Patient is been transferred to [facility] place, report called to the receiving nurse Quillian Quince. Patient in stable condition.

## 2014-02-27 NOTE — Clinical Social Work Note (Signed)
Discharge summary has been faxed to [facility]. Discharge packet completed and placed on pt's shadow chart. CSW has received SNF authorization from [facility] [phone]). Pt and pt's family friend Heron Nay) have been updated regarding discharge, both understanding and agreeable. CSW has arranged for transportation via EMS ([facility]).   RN to please call report to [facility] [place], MSW, [facility] Licensed Clinical Social Worker [phone] and [phone]

## 2014-02-27 NOTE — Telephone Encounter (Signed)
This encounter was created in error

## 2014-02-27 NOTE — Discharge Summary (Signed)
Physician Discharge Summary  Dana Blackwell VZD:[id number] DOB: [birth year] DOA: 02/19/2014  PCP: [name], MD  Admit date: 02/19/2014 Discharge date: 02/27/2014  Time spent: >30 minutes  Recommendations for Outpatient Follow-up:  Follow-up Information   Follow up with Sherren Mocha, MD On 03/27/2014. (at 3:15, please keep appt.)    Specialty:  Cardiology   Contact information:   [address] [address] [phone]       Follow up with Sherren Mocha, MD On 03/02/2014. (to have monitor placed at 1230)    Specialty:  Cardiology   Contact information:   [address] [place] [address] [phone]       Please follow up. (M.D. in 1 to 2 days)       Follow up with Faythe Ghee, MD. (In 7-10 days-patient is to have repeat CT scan per Dr. Hal Neer 2weeks from her 7/6 CT)    Specialty:  Neurosurgery   Contact information:   [address] [place] [address] [phone]        Discharge Diagnoses:  Active Problems:   DM, TYPE II   CAD 3V at cath 2006- medical Rx   Traumatic SAH (small)   Syncope   Pulmonary HTN   Discharge Condition: Improved/stable  Diet recommendation: Modified carbohydrates  Filed Weights   02/19/14 1426 02/20/14 0500  Weight: 66.679 kg (147 lb) 66.6 kg (146 lb 13.2 oz)    History of present illness:  80 year old patient with h/o CVA, DM, CAD recent PCI/stents x2 (DES) to her LAD on 4/15 admitted on 02/19/14 after a fall vs syncope, resulting in acute subarachnoid hemorrhage as well as fracture of her first rib.  Admitted by PCCM, Neurosurgery was consulted and followed patient during this hospitalization with conservative management. Repeat head CT has been stable. Both aspirin and Plavix have been on hold since admission.  Transferred from [facility] to [facility] 7/5, and remained stable.  Hospital Course:  1.Sub arachnoid hemorrhage-Moderate to Large based on Admission CT -As  discussed above status post fall vs Syncope  -Following admission patient had CTA no aneurysm noted  -Neurosurgery was consulted and recommended conservative mgt and followed patient in the hospital -All anticoagulants/antiplatelet ASA/plavix on were held and patient had repeat CT head 7/6 with mild improvement in [name] and slight increase in IV blood, neurosurgery/ Dr Hal Neer followed up from the brain stand point stated 8 would be safest just to keep her off all anti-coagulation for a few weeks, including her asa, and recheck a CT at that time.  - Discussed patient with Dr. Hal Neer this a.m. and he states that she will need to have a repeat CT scan done 2 weeks from her last one (7/6) -She is to stay off aspirin and Plavix until followup with neurosurgery as repeat CT and cardiology 2. CAD  -recent PCI and stenting of LAD with DES x2 in 4/15  -plavix and ASA on hold due to 1 as discussed above -Continue metoprolol/statin  -resume ASA or plavix or both when ok with Neurosurgery>> she is to have repeat CT in about 10 days(since her last CT was on 7/6) follow up with cardiology on 7/13 for close monitoring and monitor is to be placed then as discussed with Dr. Radford Pax [name] has remained chest pain-free. 3. Syncope vs Fall  -2D ECHO with grade 2 DD and elevated PA pressure  -See below  -Event monitor per Cards @ DC >> on followup with  Dr. Burt Knack on 7/13 4. Elevated PA pressure  -VQ scan neg for PE, lower ext dopplers neg for VTE  -likely from COPD, ? OSA  5. DM  -stable  -continue lantus, SSI  6. HTN  -better control  -continue clonidine, lisinopril imdur, metoprolol  -hydralazine PRN  7. GERD -continue PPI  8. Hypothyroidism  -continue synthroid  9. COPD  -stable, on Home O2  10. Right first rib fx  -She was managed supportively incentive spirometry, supportive care while in the hospital -continue pain management. -She be discharged to SNF for further rehabilitation  11.  Constipation  -start on senekot and prn dulcolax -Received enema last p.m 7/9. and better this a.m.   Procedures:  2-D echo  Study Conclusions  - Left ventricle: The cavity size was normal. Systolic function was normal. The estimated ejection fraction was in the range of 60% to 65%. Wall motion was normal; there were no regional wall motion abnormalities. Features are consistent with a pseudonormal left ventricular filling pattern, with concomitant abnormal relaxation and increased filling pressure (grade 2 diastolic dysfunction). - Mitral valve: Moderately calcified annulus. Mildly thickened leaflets . - Left atrium: The atrium was mildly dilated. - Right ventricle: The cavity size was mildly dilated. Wall thickness was normal. - Right atrium: The atrium was mildly dilated. - Pulmonary arteries: Systolic pressure was severely increased. PA peak pressure: 75 mm Hg (S).  Transthoracic echocardiography.   Lower extremity Doppler ultrasound  Summary: No evidence of deep vein thrombosis involving the right lower extremity and left lower extremity.  Consultations:   neurosurgery    cardiology  Discharge Exam: Filed Vitals:   02/27/14 1100  BP: 130/62  Pulse: 75  Temp: 98.4 F (36.9 C)  Resp: 18   Exam:  General: AAOx3, no distress  Cardiovascular: S1S2/RRR  Respiratory: CTAB  Abdomen: soft, NT, BS present  Musculoskeletal: no edema c/c  NEuro: non focal     Discharge Instructions You were cared for by a hospitalist during your hospital stay. If you have any questions about your discharge medications or the care you received while you were in the hospital after you are discharged, you can call the unit and asked to speak with the hospitalist on call if the hospitalist that took care of you is not available. Once you are discharged, your primary care physician will handle any further medical issues. Please note that NO REFILLS for any discharge medications will be  authorized once you are discharged, as it is imperative that you return to your primary care physician (or establish a relationship with a primary care physician if you do not have one) for your aftercare needs so that they can reassess your need for medications and monitor your lab values.  Discharge Instructions   Diet Carb Modified    Complete by:  As directed      Increase activity slowly    Complete by:  As directed             Medication List    STOP taking these medications       aspirin 81 MG EC tablet     clopidogrel 75 MG tablet  Commonly known as:  PLAVIX     hydrocodone-acetaminophen 5-500 MG per capsule  Commonly known as:  LORCET-HD  Replaced by:  HYDROcodone-acetaminophen 5-325 MG per tablet     predniSONE 5 MG tablet  Commonly known as:  DELTASONE      TAKE these medications  acetaminophen 650 MG CR tablet  Commonly known as:  TYLENOL  Take 650 mg by mouth daily.     ALPRAZolam 0.25 MG tablet  Commonly known as:  XANAX  Take 1 tablet (0.25 mg total) by mouth 3 (three) times daily as needed for anxiety.     atorvastatin 20 MG tablet  Commonly known as:  LIPITOR  Take 20 mg by mouth daily at 6 PM.     beta carotene w/minerals tablet  Take 1 tablet by mouth at bedtime.     bisacodyl 5 MG EC tablet  Commonly known as:  DULCOLAX  Take 2 tablets (10 mg total) by mouth daily as needed for moderate constipation.     calcium carbonate 600 MG Tabs tablet  Commonly known as:  OS-CAL  Take 600 mg by mouth daily.     cloNIDine 0.1 MG tablet  Commonly known as:  CATAPRES  Take 1 tablet (0.1 mg total) by mouth 2 (two) times daily.     fish oil-omega-3 fatty acids 1000 MG capsule  Take 1 g by mouth daily.     FOLTX 2.5-25-2 MG Tabs  Generic drug:  folic acid-pyridoxine-cyancobalamin  Take 1 tablet by mouth daily.     furosemide 20 MG tablet  Commonly known as:  LASIX  Take 20 mg by mouth daily.     Heart Rate Monitor Misc  1 Device by Does not  apply route as directed. Wear continuously for 30 days     HYDROcodone-acetaminophen 5-325 MG per tablet  Commonly known as:  NORCO/VICODIN  Take 1 tablet by mouth every 6 (six) hours as needed for moderate pain.     insulin glargine 100 UNIT/ML injection  Commonly known as:  LANTUS  Inject 0.1 mLs (10 Units total) into the skin at bedtime.     insulin lispro 100 UNIT/ML injection  Commonly known as:  HUMALOG  Inject 4-5 Units into the skin 3 (three) times daily before meals.     isosorbide mononitrate 30 MG 24 hr tablet  Commonly known as:  IMDUR  Take 1 tablet (30 mg total) by mouth daily.     levothyroxine 75 MCG tablet  Commonly known as:  SYNTHROID, LEVOTHROID  Take 75 mcg by mouth daily.     lidocaine 5 %  Commonly known as:  LIDODERM  Place 1 patch onto the skin daily. Remove & Discard patch within 12 hours or as directed by MD     lisinopril 5 MG tablet  Commonly known as:  PRINIVIL,ZESTRIL  Take 1 tablet (5 mg total) by mouth daily.     MAGNESIUM PO  Take 1 tablet by mouth at bedtime.     metoprolol succinate 100 MG 24 hr tablet  Commonly known as:  TOPROL-XL  Take 100 mg by mouth every morning.     pantoprazole 40 MG tablet  Commonly known as:  PROTONIX  Take 40 mg by mouth daily.     senna-docusate 8.6-50 MG per tablet  Commonly known as:  Senokot-S  Take 1 tablet by mouth daily.     sertraline 100 MG tablet  Commonly known as:  ZOLOFT  Take 50 mg by mouth daily.     traMADol 50 MG tablet  Commonly known as:  ULTRAM  Take 1-2 tablets (50-100 mg total) by mouth every 6 (six) hours as needed (50mg  for mild pain, 75mg  for moderate pain, 100mg  for severe pain).     VITAMIN B-12 PO  Take 1 tablet by  mouth daily.       Allergies  Allergen Reactions  . Tetanus Toxoid Other (See Comments)    unknown       Follow-up Information   Follow up with Sherren Mocha, MD On 03/27/2014. (at 3:15, please keep appt.)    Specialty:  Cardiology   Contact  information:   [address] [address] [phone]       Follow up with Sherren Mocha, MD On 03/02/2014. (to have monitor placed at 1230)    Specialty:  Cardiology   Contact information:   [address] [place] [address] [phone]        The results of significant diagnostics from this hospitalization (including imaging, microbiology, ancillary and laboratory) are listed below for reference.    Significant Diagnostic Studies: Ct Angio Head W/cm &/or Wo Cm  02/19/2014   CLINICAL DATA:  Fall.  Intracranial hemorrhage.  EXAM: CT ANGIOGRAPHY HEAD  TECHNIQUE: Multidetector CT imaging of the head was performed using the standard protocol during bolus administration of intravenous contrast. Multiplanar CT image reconstructions and MIPs were obtained to evaluate the vascular anatomy.  CONTRAST:  1mL OMNIPAQUE IOHEXOL 350 MG/ML SOLN  COMPARISON:  CT head without contrast 02/19/2014.  FINDINGS: Dense atherosclerotic calcifications are present within the cavernous carotid arteries bilaterally. The lumen of the right internal carotid artery is narrowed to less than 1 mm. The lumen of the left internal carotid artery is narrowed to 1.2 mm. The ICA termini are intact bilaterally. The A1 and M1 segments are within normal limits. The MCA bifurcations are intact. There is no significant aneurysm within the anterior circulation. There is some attenuation of distal ACA and MCA branch vessels without a significant proximal stenosis or occlusion.  The left vertebral artery is the dominant vessel. The PICA origins are visualized and normal bilaterally. The right vertebral artery terminates at the PICA. The left vertebral artery continues as the basilar artery with mild proximal narrowing. Both posterior cerebral arteries originate from the basilar tip. The PCA branch vessels are within normal limits bilaterally.  The dural sinuses are patent.  Cortical veins are  intact.  Review of the MIP images confirms the above findings.  IMPRESSION: 1. No focal aneurysm to explain the patient's hemorrhage. 2. Dense atherosclerotic calcifications with moderate stenoses in the cavernous ICA segments bilaterally. 3. Mild small vessel disease involving the ACA and MCA branch vessels bilaterally. No significant proximal stenosis or occlusion.   Electronically Signed   By: Lawrence Santiago M.D.   On: 02/19/2014 18:27   Ct Head Wo Contrast  02/23/2014   CLINICAL DATA:  Followup intracranial hemorrhage.  Fall.  EXAM: CT HEAD WITHOUT CONTRAST  TECHNIQUE: Contiguous axial images were obtained from the base of the skull through the vertex without intravenous contrast.  COMPARISON:  CT 02/20/2014  FINDINGS: High-density subarachnoid hemorrhage remains bilaterally with mild improvement. Slight increase in blood in the occipital horns bilaterally. No new area of hemorrhage identified. Ventricle size is unchanged from prior studies  Mild chronic microvascular ischemic change in the white matter. No acute infarct or mass identified.  IMPRESSION: Mild improvement in subarachnoid hemorrhage. Slight increase in intraventricular blood. Ventricle size is stable without significant hydrocephalus.   Electronically Signed   By: Franchot Gallo M.D.   On: 02/23/2014 11:00   Ct Head Wo Contrast  02/20/2014   CLINICAL DATA:  Followup traumatic brain injury  EXAM: CT HEAD WITHOUT CONTRAST  TECHNIQUE: Contiguous axial images  were obtained from the base of the skull through the vertex without intravenous contrast.  COMPARISON:  02/19/2014  FINDINGS: No acute fracture. The mastoids, middle ears, and imaged paranasal sinuses remain aerated. No acute soft tissue findings in the orbits.  Similar degree of subarachnoid blood within sulci of the right more than left cerebral convexities. Hemorrhage is again noted within the basal cisterns. Intraventricular hemorrhage has increased and volume, layering in the occipital  horns of the lateral ventricles. There is no evidence of hydrocephalus. No evidence of acute infarct, brain edema, shift, or herniation. Lacunar spaces in the bilateral putamen are again noted.  IMPRESSION: 1. Stable volume of subarachnoid hemorrhage. 2. Increased, but still small volume, intraventricular hemorrhage at the lateral ventricles. No hydrocephalus.   Electronically Signed   By: Jorje Guild M.D.   On: 02/20/2014 04:58   Ct Head Wo Contrast  02/19/2014   CLINICAL DATA:  Fall  EXAM: CT HEAD WITHOUT CONTRAST  CT CERVICAL SPINE WITHOUT CONTRAST  TECHNIQUE: Multidetector CT imaging of the head and cervical spine was performed following the standard protocol without intravenous contrast. Multiplanar CT image reconstructions of the cervical spine were also generated.  COMPARISON:  None.  FINDINGS: CT HEAD FINDINGS  Moderate to large subarachnoid hemorrhage. There is high-density acute hemorrhage in the basilar cisterns extending into the right sylvian fissure. There is subarachnoid hemorrhage over the convexity bilaterally, right greater than left. No intraventricular hemorrhage. No hydrocephalus.  Negative for acute infarct or mass.  Negative for skull fracture.  CT CERVICAL SPINE FINDINGS  Fracture the right first rib without significant displacement. No apical hematoma identified.  1 mm anterior slip C4-5. Extensive disc degeneration and spondylosis at C3-4 with right-sided facet degeneration. Bilateral facet degeneration at C4-5. Solid fusion at C5-6. Spondylosis at C6-7, C7-T1, and T1-2. Diffuse facet degeneration.  Negative for cervical spine fracture.  IMPRESSION: Moderate to advanced subarachnoid hemorrhage including significant blood in the basilar cisterns. This pattern could be seen with trauma however aneurysm rupture needs to be considered based on the amount of blood in the basilar cisterns.  Nondisplaced fracture right first rib.  Advanced cervical spondylosis.  No cervical spine fracture.   Carotid artery calcification bilaterally.  Critical Value/emergent results were called by telephone at the time of interpretation on 02/19/2014 at 4:16 PM to Dr. Daleen Bo , who verbally acknowledged these results.   Electronically Signed   By: Franchot Gallo M.D.   On: 02/19/2014 16:17   Ct Cervical Spine Wo Contrast  02/19/2014   CLINICAL DATA:  Fall  EXAM: CT HEAD WITHOUT CONTRAST  CT CERVICAL SPINE WITHOUT CONTRAST  TECHNIQUE: Multidetector CT imaging of the head and cervical spine was performed following the standard protocol without intravenous contrast. Multiplanar CT image reconstructions of the cervical spine were also generated.  COMPARISON:  None.  FINDINGS: CT HEAD FINDINGS  Moderate to large subarachnoid hemorrhage. There is high-density acute hemorrhage in the basilar cisterns extending into the right sylvian fissure. There is subarachnoid hemorrhage over the convexity bilaterally, right greater than left. No intraventricular hemorrhage. No hydrocephalus.  Negative for acute infarct or mass.  Negative for skull fracture.  CT CERVICAL SPINE FINDINGS  Fracture the right first rib without significant displacement. No apical hematoma identified.  1 mm anterior slip C4-5. Extensive disc degeneration and spondylosis at C3-4 with right-sided facet degeneration. Bilateral facet degeneration at C4-5. Solid fusion at C5-6. Spondylosis at C6-7, C7-T1, and T1-2. Diffuse facet degeneration.  Negative for cervical spine fracture.  IMPRESSION:  Moderate to advanced subarachnoid hemorrhage including significant blood in the basilar cisterns. This pattern could be seen with trauma however aneurysm rupture needs to be considered based on the amount of blood in the basilar cisterns.  Nondisplaced fracture right first rib.  Advanced cervical spondylosis.  No cervical spine fracture.  Carotid artery calcification bilaterally.  Critical Value/emergent results were called by telephone at the time of interpretation on  02/19/2014 at 4:16 PM to Dr. Daleen Bo , who verbally acknowledged these results.   Electronically Signed   By: Franchot Gallo M.D.   On: 02/19/2014 16:17   Nm Pulmonary Perf And Vent  02/24/2014   CLINICAL DATA:  elevated PA pressure and high d dimer  EXAM: NUCLEAR MEDICINE VENTILATION - PERFUSION LUNG SCAN  TECHNIQUE: Ventilation images were obtained in multiple projections using inhaled aerosol technetium 99 M DTPA. Perfusion images were obtained in multiple projections after intravenous injection of Tc-32m MAA.  RADIOPHARMACEUTICALS:  40 mCi Tc-80m DTPA aerosol and 6 mCi Tc-27m MAA  COMPARISON:  Portable chest radiograph dated 02/19/2014.  FINDINGS: Ventilation: No focal ventilation defect. There is blunting of the costophrenic angles.  Perfusion: No wedge shaped peripheral perfusion defects to suggest acute pulmonary embolism. Matched mild blunting of the costophrenic angles.  IMPRESSION: Low probability nuclear medicine ventilation perfusion scan for pulmonary embolus.   Electronically Signed   By: Margaree Mackintosh M.D.   On: 02/24/2014 17:28   Dg Chest Port 1 View  02/19/2014   CLINICAL DATA:  The patient fainted today.  EXAM: PORTABLE CHEST - 1 VIEW  COMPARISON:  November 23, 2013 the mediastinal contour is normal. The heart size is enlarged. There is bilateral diffuse increased pulmonary interstitium. Minimal bilateral pleural effusions are identified. The visualized skeletal structures are stable.  IMPRESSION: Mild congestive heart failure.   Electronically Signed   By: Abelardo Diesel M.D.   On: 02/19/2014 15:07    Microbiology: Recent Results (from the past 240 hour(s))  URINE CULTURE     Status: None   Collection Time    02/19/14  3:23 PM      Result Value Ref Range Status   Specimen Description URINE, CLEAN CATCH   Final   Special Requests NONE   Final   Culture  Setup Time     Final   Value: 02/19/2014 15:52     Performed at [facility]     Final   Value: 15,000  COLONIES/ML     Performed at [facility]   Culture     Final   Value: LACTOBACILLUS SPECIES     Note: Standardized susceptibility testing for this organism is not available.     Performed at [facility]   Report Status 02/20/2014 FINAL   Final  MRSA PCR SCREENING     Status: None   Collection Time    02/19/14  9:20 PM      Result Value Ref Range Status   MRSA by PCR NEGATIVE  NEGATIVE Final   Comment:            The GeneXpert MRSA Assay (FDA     approved for NASAL specimens     only), is one component of a     comprehensive MRSA colonization     surveillance program. It is not     intended to diagnose MRSA     infection nor to guide or     monitor treatment for     MRSA  infections.     Labs: Basic Metabolic Panel:  Recent Labs Lab 02/21/14 0240 02/22/14 0442 02/26/14 0546  NA 140 139 137  K 5.0 4.5 4.3  CL 102 99 99  CO2 27 28 29   GLUCOSE 154* 178* 105*  BUN 22 21 30*  CREATININE 1.11* 1.20* 1.13*  CALCIUM 8.7 9.0 8.7   Liver Function Tests: No results found for this basename: AST, ALT, ALKPHOS, BILITOT, PROT, ALBUMIN,  in the last 168 hours No results found for this basename: LIPASE, AMYLASE,  in the last 168 hours No results found for this basename: AMMONIA,  in the last 168 hours CBC:  Recent Labs Lab 02/21/14 0240 02/22/14 0442  WBC 10.0 9.6  HGB 8.6* 9.2*  HCT 28.0* 30.5*  MCV 92.7 93.0  PLT 254 254   Cardiac Enzymes: No results found for this basename: CKTOTAL, CKMB, CKMBINDEX, TROPONINI,  in the last 168 hours BNP: BNP (last 3 results) No results found for this basename: PROBNP,  in the last 8760 hours CBG:  Recent Labs Lab 02/26/14 1148 02/26/14 1628 02/26/14 2151 02/27/14 0725 02/27/14 1129  GLUCAP 212* 90 175* 210* 163*       Signed:  Harinder Romas C  [facility] 02/27/2014, 12:27 PM

## 2014-03-02 ENCOUNTER — Encounter: Payer: Self-pay | Admitting: Adult Health

## 2014-03-02 ENCOUNTER — Encounter (HOSPITAL_COMMUNITY): Payer: Medicare HMO

## 2014-03-02 ENCOUNTER — Non-Acute Institutional Stay (SKILLED_NURSING_FACILITY): Payer: Commercial Managed Care - HMO | Admitting: Adult Health

## 2014-03-02 DIAGNOSIS — K219 Gastro-esophageal reflux disease without esophagitis: Secondary | ICD-10-CM

## 2014-03-02 DIAGNOSIS — I609 Nontraumatic subarachnoid hemorrhage, unspecified: Secondary | ICD-10-CM

## 2014-03-02 DIAGNOSIS — K5900 Constipation, unspecified: Secondary | ICD-10-CM

## 2014-03-02 DIAGNOSIS — S2231XB Fracture of one rib, right side, initial encounter for open fracture: Secondary | ICD-10-CM

## 2014-03-02 DIAGNOSIS — S2239XB Fracture of one rib, unspecified side, initial encounter for open fracture: Secondary | ICD-10-CM

## 2014-03-02 DIAGNOSIS — F329 Major depressive disorder, single episode, unspecified: Secondary | ICD-10-CM

## 2014-03-02 DIAGNOSIS — I2510 Atherosclerotic heart disease of native coronary artery without angina pectoris: Secondary | ICD-10-CM

## 2014-03-02 DIAGNOSIS — E119 Type 2 diabetes mellitus without complications: Secondary | ICD-10-CM

## 2014-03-02 DIAGNOSIS — J438 Other emphysema: Secondary | ICD-10-CM

## 2014-03-02 DIAGNOSIS — S2231XA Fracture of one rib, right side, initial encounter for closed fracture: Secondary | ICD-10-CM | POA: Insufficient documentation

## 2014-03-02 DIAGNOSIS — I10 Essential (primary) hypertension: Secondary | ICD-10-CM

## 2014-03-02 DIAGNOSIS — E039 Hypothyroidism, unspecified: Secondary | ICD-10-CM

## 2014-03-02 DIAGNOSIS — F3289 Other specified depressive episodes: Secondary | ICD-10-CM

## 2014-03-02 NOTE — Progress Notes (Signed)
Patient ID: Dana Blackwell, female   DOB: November 01, 1927, 80 y.o.   MRN: [id number]               PROGRESS NOTE  DATE: 03/02/2014  FACILITY: Nursing Home Location: [facility]  LEVEL OF CARE: SNF (31)  Acute Visit  CHIEF COMPLAINT:  Follow-up Hospitalization  HISTORY OF PRESENT ILLNESS:  This is an 80 year old female who has been admitted to [facility] on 02/27/14 from [facility]. She had a fall and sustained an acute subarachnoid hemorrhage and Fracture of right 1st rib. She has been admitted for a short-term rehabilitation.  REASSESSMENT OF ONGOING PROBLEM(S):  HTN: Pt 's HTN remains stable.  Denies CP, sob, DOE, pedal edema, headaches, dizziness or visual disturbances.  No complications from the medications currently being used.  Last BP : 110/68  DM:pt's DM remains stable.  Pt denies polyuria, polydipsia, polyphagia, changes in vision or hypoglycemic episodes.  No complications noted from the medication presently being used.   4/15 hemoglobin A1c is: 6.8  GERD: pt's GERD is stable.  Denies ongoing heartburn, abd. Pain, nausea or vomiting.  Currently on a PPI & tolerates it without any adverse reactions.   PAST MEDICAL HISTORY : Reviewed.  No changes/see problem list  CURRENT MEDICATIONS: Reviewed per MAR/see medication list  REVIEW OF SYSTEMS:  GENERAL: no change in appetite, no fatigue, no weight changes, no fever, chills or weakness RESPIRATORY: no cough, SOB, DOE, wheezing, hemoptysis CARDIAC: no chest pain, edema or palpitations GI: no abdominal pain, diarrhea, heart burn, nausea or vomiting, +constipation  PHYSICAL EXAMINATION  GENERAL: no acute distress, normal body habitus EYES: conjunctivae normal, sclerae normal, normal eye lids NECK: supple, trachea midline, no neck masses, no thyroid tenderness, no thyromegaly LYMPHATICS: no LAN in the neck, no supraclavicular LAN RESPIRATORY: breathing is even & unlabored, BS CTAB CARDIAC:  RRR, no murmur,no extra heart sounds, no edema GI: abdomen soft, normal BS, no masses, no tenderness, no hepatomegaly, no splenomegaly EXTREMITIES: able to move all 4 extremities PSYCHIATRIC: the patient is alert & oriented to person, affect & behavior appropriate  LABS/RADIOLOGY: Labs reviewed: Basic Metabolic Panel:  Recent Labs  02/21/14 0240 02/22/14 0442 02/26/14 0546  NA 140 139 137  K 5.0 4.5 4.3  CL 102 99 99  CO2 27 28 29   GLUCOSE 154* 178* 105*  BUN 22 21 30*  CREATININE 1.11* 1.20* 1.13*  CALCIUM 8.7 9.0 8.7   CBC:  Recent Labs  02/19/14 1505 02/20/14 0250 02/21/14 0240 02/22/14 0442  WBC 7.7 9.5 10.0 9.6  NEUTROABS 5.5  --   --   --   HGB 9.9* 8.6* 8.6* 9.2*  HCT 32.6* 27.7* 28.0* 30.5*  MCV 92.6 92.3 92.7 93.0  PLT 279 265 254 254   Cardiac Enzymes:  Recent Labs  11/23/13 1839 11/24/13 0112 02/19/14 1505  TROPONINI <0.30 <0.30 <0.30   CBG:  Recent Labs  02/27/14 0725 02/27/14 1129 02/27/14 1512  GLUCAP 210* 163* 102*     ASSESSMENT/PLAN:  Traumatic subarachnoid hemorrhage - conservative management recommended by neuro Fracture of right 1st rib - for rehabilitation CAD - stable; ASA and PLavix on hold Diabetes Mellitus, type 2 - well controlled; continue Lantus and Humalog SSI  Hypertension - well controlled; continue lisinopril, and do her, clonidine and metoprolol GERD - stable; continue Protonix Hypothyroidism - continue Synthroid COPD - continue O2 at 2 L/minute Depression - continue Zoloft Constipation - continue Senokot and Dulcolax when necessary  CPT CODE: 62446   Seth Bake - NP [facility] [phone]

## 2014-03-03 ENCOUNTER — Encounter (INDEPENDENT_AMBULATORY_CARE_PROVIDER_SITE_OTHER): Payer: Commercial Managed Care - HMO

## 2014-03-03 ENCOUNTER — Encounter: Payer: Self-pay | Admitting: *Deleted

## 2014-03-03 DIAGNOSIS — R55 Syncope and collapse: Secondary | ICD-10-CM

## 2014-03-03 NOTE — Progress Notes (Signed)
Patient ID: Dana Blackwell, female   DOB: 17-Nov-1927, 80 y.o.   MRN: [id number] [employer] 30 day cardiac event monitor applied to patient.  Patient currently residing at [facility], [place], phone number [phone].

## 2014-03-04 ENCOUNTER — Encounter (HOSPITAL_COMMUNITY): Payer: Medicare HMO

## 2014-03-05 ENCOUNTER — Non-Acute Institutional Stay (SKILLED_NURSING_FACILITY): Payer: Commercial Managed Care - HMO | Admitting: Internal Medicine

## 2014-03-05 DIAGNOSIS — E039 Hypothyroidism, unspecified: Secondary | ICD-10-CM

## 2014-03-05 DIAGNOSIS — E1059 Type 1 diabetes mellitus with other circulatory complications: Secondary | ICD-10-CM

## 2014-03-05 DIAGNOSIS — I609 Nontraumatic subarachnoid hemorrhage, unspecified: Secondary | ICD-10-CM

## 2014-03-05 DIAGNOSIS — I2510 Atherosclerotic heart disease of native coronary artery without angina pectoris: Secondary | ICD-10-CM

## 2014-03-06 ENCOUNTER — Encounter (HOSPITAL_COMMUNITY): Payer: Medicare HMO

## 2014-03-06 DIAGNOSIS — E1051 Type 1 diabetes mellitus with diabetic peripheral angiopathy without gangrene: Secondary | ICD-10-CM | POA: Insufficient documentation

## 2014-03-06 DIAGNOSIS — E1059 Type 1 diabetes mellitus with other circulatory complications: Secondary | ICD-10-CM | POA: Insufficient documentation

## 2014-03-06 NOTE — Progress Notes (Signed)
HISTORY & PHYSICAL  DATE: 03/05/2014   FACILITY: [facility]  LEVEL OF CARE: SNF (31)  ALLERGIES:  Allergies  Allergen Reactions  . Tetanus Toxoid Other (See Comments)    unknown    CHIEF COMPLAINT:  Manage SAH, CAD and diabetes mellitus  HISTORY OF PRESENT ILLNESS: 80 year old Caucasian female was hospitalized after a fall versus syncope. After hospitalization she is admitted to this facility for short-term rehabilitation.  SAH: She was diagnosed with a moderate to large subarachnoid hemorrhage. Neurosurgery recommended conservative management and holding anticoagulation. Aspirin and Plavix are currently on hold until seen by a neurosurgeon. Patient denies headaches, visual disturbances or any other acute symptoms. Repeat head CT was stable.  CAD: The angina has been stable. The patient denies dyspnea on exertion, orthopnea, pedal edema, palpitations and paroxysmal nocturnal dyspnea. No complications noted from the medication presently being used. She had recent PCI and stenting.  DM:pt's DM remains stable.  Pt denies polyuria, polydipsia, polyphagia, changes in vision or hypoglycemic episodes.  No complications noted from the medication presently being used.  Last hemoglobin A1c is: Not available.  PAST MEDICAL HISTORY :  Past Medical History  Diagnosis Date  . Coronary artery disease     11/2012: s/p 3.0 x 28 mm Xience and 2.5 x 28 mm Xience stents to the LAD    . Hypertension   . Hyperlipidemia   . Stroke     hx of  . Obesity   . Heart murmur   . Diabetes mellitus     type II  . Hypothyroidism   . Esophageal stricture   . Gastritis   . Nausea and vomiting   . Diarrhea   . Abdominal pain   . Esophageal reflux   . Depression   . Uterine cancer   . H/O: hysterectomy     PAST SURGICAL HISTORY: Past Surgical History  Procedure Laterality Date  . Bladder surgery      tacking  . Cervical disc surgery    . Cholecystectomy    . Total  abdominal hysterectomy      SOCIAL HISTORY:  reports that she has never smoked. She does not have any smokeless tobacco history on file. She reports that she does not drink alcohol or use illicit drugs.  FAMILY HISTORY:  Family History  Problem Relation Age of Onset  . Heart attack Mother 38    died  . Coronary artery disease      family hx on  father side    CURRENT MEDICATIONS: Reviewed per MAR/see medication list  REVIEW OF SYSTEMS:  See HPI otherwise 14 point ROS is negative.  PHYSICAL EXAMINATION  VS:  See VS section  GENERAL: no acute distress, normal body habitus EYES: conjunctivae normal, sclerae normal, normal eye lids MOUTH/THROAT: lips without lesions,no lesions in the mouth,tongue is without lesions,uvula elevates in midline NECK: supple, trachea midline, no neck masses, no thyroid tenderness, no thyromegaly LYMPHATICS: no LAN in the neck, no supraclavicular LAN RESPIRATORY: breathing is even & unlabored, BS CTAB CARDIAC: RRR, no murmur,no extra heart sounds, no edema GI:  ABDOMEN: abdomen soft, normal BS, no masses, no tenderness  LIVER/SPLEEN: no hepatomegaly, no splenomegaly MUSCULOSKELETAL: HEAD: normal to inspection  EXTREMITIES: LEFT UPPER EXTREMITY: full range of motion, normal strength & tone RIGHT UPPER EXTREMITY:  full range of motion, normal strength & tone LEFT LOWER EXTREMITY:  Moderate range of motion, normal strength & tone RIGHT LOWER EXTREMITY: Moderate range  of motion, normal strength & tone PSYCHIATRIC: the patient is alert & oriented to person, affect & behavior appropriate  LABS/RADIOLOGY:  Labs reviewed: Basic Metabolic Panel:  Recent Labs  02/21/14 0240 02/22/14 0442 02/26/14 0546  NA 140 139 137  K 5.0 4.5 4.3  CL 102 99 99  CO2 27 28 29   GLUCOSE 154* 178* 105*  BUN 22 21 30*  CREATININE 1.11* 1.20* 1.13*  CALCIUM 8.7 9.0 8.7   CBC:  Recent Labs  02/19/14 1505 02/20/14 0250 02/21/14 0240 02/22/14 0442  WBC 7.7  9.5 10.0 9.6  NEUTROABS 5.5  --   --   --   HGB 9.9* 8.6* 8.6* 9.2*  HCT 32.6* 27.7* 28.0* 30.5*  MCV 92.6 92.3 92.7 93.0  PLT 279 265 254 254   Cardiac Enzymes:  Recent Labs  11/23/13 1839 11/24/13 0112 02/19/14 1505  TROPONINI <0.30 <0.30 <0.30   CBG:  Recent Labs  02/27/14 0725 02/27/14 1129 02/27/14 1512  GLUCAP 210* 163* 102*    Transthoracic Echocardiography  (Report amended )  Patient:    Dana Blackwell, Dana Blackwell MR #:       [id number] Study Date: 02/22/2014 Gender:     F Age:        43 Height:     152.4 cm Weight:     66.2 kg BSA:        1.7 m^2 Pt. Status: Room:       4N19C   Dana Blackwell [id number]  ATTENDING    [name]  [name]  SONOGRAPHER  [name], [name], [name]  PERFORMING   [name], Inpatient  cc:  ------------------------------------------------------------------- LV EF: 60% -   65%  ------------------------------------------------------------------- Indications:      Syncope 780.2.  ------------------------------------------------------------------- History:   PMH:   Chronic obstructive pulmonary disease.  Risk factors:  Hypertension. Diabetes mellitus. Dyslipidemia.  ------------------------------------------------------------------- Study Conclusions  - Left ventricle: The cavity size was normal. Systolic function was   normal. The estimated ejection fraction was in the range of 60%   to 65%. Wall motion was normal; there were no regional wall   motion abnormalities. Features are consistent with a pseudonormal   left ventricular filling pattern, with concomitant abnormal   relaxation and increased filling pressure (grade 2 diastolic   dysfunction). - Mitral valve: Moderately calcified annulus. Mildly thickened   leaflets . - Left atrium: The atrium was mildly dilated. - Right ventricle: The cavity size was mildly dilated. Wall   thickness was normal. - Right atrium: The atrium was mildly  dilated. - Pulmonary arteries: Systolic pressure was severely increased. PA   peak pressure: 75 mm Hg (S).  Transthoracic echocardiography.  M-mode, complete 2D, spectral Doppler, and color Doppler.  Birthdate:  Patient birthdate: 10/24/1927.  Age:  Patient is 80 yr old.  Sex:  Gender: female. Height:  Height: 152.4 cm. Height: 60 in.  Weight:  Weight: 66.2 kg. Weight: 145.7 lb.  Body mass index:  BMI: 28.5 kg/m^2.  Body surface area:    BSA: 1.7 m^2.  Blood pressure:     154/52  Patient status:  Inpatient.  Study date:  Study date: 02/22/2014. Study time: 02:22 PM.  -------------------------------------------------------------------  ------------------------------------------------------------------- Left ventricle:  The cavity size was normal. Systolic function was normal. The estimated ejection fraction was in the range of 60% to 65%. Wall motion was normal; there were no regional wall motion abnormalities. Features are consistent with a pseudonormal left ventricular  filling pattern, with concomitant abnormal relaxation and increased filling pressure (grade 2 diastolic dysfunction).  ------------------------------------------------------------------- Aortic valve:   Trileaflet; mildly thickened, mildly calcified leaflets. Mobility was not restricted.  Doppler:  Transvalvular velocity was within the normal range. There was no stenosis. There was no regurgitation.  ------------------------------------------------------------------- Aorta:  Aortic root: The aortic root was normal in size.  ------------------------------------------------------------------- Mitral valve:   Moderately calcified annulus. Mildly thickened leaflets . Mobility was not restricted.  Doppler:  Transvalvular velocity was within the normal range. There was no evidence for stenosis. There was trivial regurgitation.    Valve area by pressure half-time: 2.53 cm^2. Indexed valve area by pressure half-time:  1.49 cm^2/m^2. Valve area by continuity equation (using LVOT flow): 0.81 cm^2. Indexed valve area by continuity equation (using LVOT flow): 0.48 cm^2/m^2.    Mean gradient (D): 3 mm Hg. Peak gradient (D): 7 mm Hg.  ------------------------------------------------------------------- Left atrium:  The atrium was mildly dilated.  ------------------------------------------------------------------- Right ventricle:  The cavity size was mildly dilated. Wall thickness was normal. Systolic function was normal.  ------------------------------------------------------------------- Pulmonic valve:    Structurally normal valve.   Cusp separation was normal.  Doppler:  Transvalvular velocity was within the normal range. There was no evidence for stenosis. There was mild regurgitation.  ------------------------------------------------------------------- Tricuspid valve:   Structurally normal valve.    Doppler: Transvalvular velocity was within the normal range. There was mild regurgitation.  ------------------------------------------------------------------- Pulmonary artery:   The main pulmonary artery was normal-sized. Systolic pressure was severely increased.  ------------------------------------------------------------------- Right atrium:  The atrium was mildly dilated.  ------------------------------------------------------------------- Pericardium:  There was no pericardial effusion.  ------------------------------------------------------------------- Systemic veins: Inferior vena cava: The vessel was normal in size. The respirophasic diameter changes were in the normal range (= 50%), consistent with normal central venous pressure.  ------------------------------------------------------------------- Bilateral Lower Extremity Venous Duplex Evaluation  Patient:    Dana Blackwell, Dana Blackwell MR #:       [id number] Study Date: 02/24/2014 Gender:     F Age:         59 Height: Weight: BSA: Pt. Status: Room:       4N19C   ATTENDING    Alease Medina [id number]  SONOGRAPHER  Ralene Cork, RVT  [name]  Reports also to:  ------------------------------------------------------------------- History and indications:  History  Diagnostic evaluation. Status post fall vs syncope with subsequent traumatic SAH.  ------------------------------------------------------------------- Study information:  Study status:  Routine.  Procedure:  A vascular evaluation was performed with the patient in the supine position. The right common femoral, right femoral, right profunda femoral, right popliteal, right peroneal, right posterior tibial, left common femoral, left femoral, left profunda femoral, left popliteal, left peroneal, and left posterior tibial veins were studied.    Bilateral lower extremity venous duplex evaluation.     Doppler flow study including B-mode compression maneuvers of all visualized segments, color flow Doppler and selected views of pulsed wave Doppler. Birthdate:  Patient birthdate: 1927-10-12.  Age:  Patient is 80 yr old.  Sex:  Gender: female.  Study date:  Study date: 02/24/2014. Study time: 10:56 AM.  Location:  Vascular laboratory.  Patient status:  Inpatient.  Venous flow:  +--------------------------+-------+------------------------------+ Location                  OverallFlow properties                +--------------------------+-------+------------------------------+ Right common femoral  Patent Phasic; spontaneous;                                            compressible                   +--------------------------+-------+------------------------------+ Right femoral             Patent Compressible                   +--------------------------+-------+------------------------------+ Right profunda femoral    Patent Compressible                    +--------------------------+-------+------------------------------+ Right popliteal           Patent Phasic; spontaneous;                                            compressible                   +--------------------------+-------+------------------------------+ Right posterior tibial    Patent Compressible                   +--------------------------+-------+------------------------------+ Right peroneal            Patent Compressible                   +--------------------------+-------+------------------------------+ Right saphenofemoral      Patent Compressible                   junction                                                        +--------------------------+-------+------------------------------+ Left common femoral       Patent Phasic; spontaneous;                                            compressible                   +--------------------------+-------+------------------------------+ Left femoral              Patent Compressible                   +--------------------------+-------+------------------------------+ Left profunda femoral     Patent Compressible                   +--------------------------+-------+------------------------------+ Left popliteal            Patent Phasic; spontaneous;                                            compressible                   +--------------------------+-------+------------------------------+ Left posterior tibial     Patent Compressible                   +--------------------------+-------+------------------------------+ Left peroneal  Patent Compressible                   +--------------------------+-------+------------------------------+ Left saphenofemoral       Patent Compressible                   junction                                                         +--------------------------+-------+------------------------------+  ------------------------------------------------------------------- Summary: No evidence of deep vein thrombosis involving the right lower extremity and left lower extremity.  Other specific details can be found in the table(s) above. Prepared and Electronically Authenticated by  PORTABLE CHEST - 1 VIEW   COMPARISON:  November 23, 2013 the mediastinal contour is normal. The heart size is enlarged. There is bilateral diffuse increased pulmonary interstitium. Minimal bilateral pleural effusions are identified. The visualized skeletal structures are stable.   IMPRESSION: Mild congestive heart failure. CT HEAD WITHOUT CONTRAST   CT CERVICAL SPINE WITHOUT CONTRAST   TECHNIQUE: Multidetector CT imaging of the head and cervical spine was performed following the standard protocol without intravenous contrast. Multiplanar CT image reconstructions of the cervical spine were also generated.   COMPARISON:  None.   FINDINGS: CT HEAD FINDINGS   Moderate to large subarachnoid hemorrhage. There is high-density acute hemorrhage in the basilar cisterns extending into the right sylvian fissure. There is subarachnoid hemorrhage over the convexity bilaterally, right greater than left. No intraventricular hemorrhage. No hydrocephalus.   Negative for acute infarct or mass.  Negative for skull fracture.   CT CERVICAL SPINE FINDINGS   Fracture the right first rib without significant displacement. No apical hematoma identified.   1 mm anterior slip C4-5. Extensive disc degeneration and spondylosis at C3-4 with right-sided facet degeneration. Bilateral facet degeneration at C4-5. Solid fusion at C5-6. Spondylosis at C6-7, C7-T1, and T1-2. Diffuse facet degeneration.   Negative for cervical spine fracture.   IMPRESSION: Moderate to advanced subarachnoid hemorrhage including significant blood in the basilar cisterns. This  pattern could be seen with trauma however aneurysm rupture needs to be considered based on the amount of blood in the basilar cisterns.   Nondisplaced fracture right first rib.   Advanced cervical spondylosis.  No cervical spine fracture.   Carotid artery calcification bilaterally.   Critical Value/emergent results were called by telephone at the time of interpretation on 02/19/2014 at 4:16 PM to Dr. Daleen Bo , who verbally acknowledged these results.     CT ANGIOGRAPHY HEAD   TECHNIQUE: Multidetector CT imaging of the head was performed using the standard protocol during bolus administration of intravenous contrast. Multiplanar CT image reconstructions and MIPs were obtained to evaluate the vascular anatomy.   CONTRAST:  48mL OMNIPAQUE IOHEXOL 350 MG/ML SOLN   COMPARISON:  CT head without contrast 02/19/2014.   FINDINGS: Dense atherosclerotic calcifications are present within the cavernous carotid arteries bilaterally. The lumen of the right internal carotid artery is narrowed to less than 1 mm. The lumen of the left internal carotid artery is narrowed to 1.2 mm. The ICA termini are intact bilaterally. The A1 and M1 segments are within normal limits. The MCA bifurcations are intact. There is no significant aneurysm within the anterior circulation. There is some attenuation of distal ACA and MCA branch vessels without a significant proximal stenosis or occlusion.  The left vertebral artery is the dominant vessel. The PICA origins are visualized and normal bilaterally. The right vertebral artery terminates at the PICA. The left vertebral artery continues as the basilar artery with mild proximal narrowing. Both posterior cerebral arteries originate from the basilar tip. The PCA branch vessels are within normal limits bilaterally.   The dural sinuses are patent.  Cortical veins are intact.   Review of the MIP images confirms the above findings.   IMPRESSION: 1. No  focal aneurysm to explain the patient's hemorrhage. 2. Dense atherosclerotic calcifications with moderate stenoses in the cavernous ICA segments bilaterally. 3. Mild small vessel disease involving the ACA and MCA branch vessels bilaterally. No significant proximal stenosis or occlusion.   CT HEAD WITHOUT CONTRAST   TECHNIQUE: Contiguous axial images were obtained from the base of the skull through the vertex without intravenous contrast.   COMPARISON:  02/19/2014   FINDINGS: No acute fracture. The mastoids, middle ears, and imaged paranasal sinuses remain aerated. No acute soft tissue findings in the orbits.   Similar degree of subarachnoid blood within sulci of the right more than left cerebral convexities. Hemorrhage is again noted within the basal cisterns. Intraventricular hemorrhage has increased and volume, layering in the occipital horns of the lateral ventricles. There is no evidence of hydrocephalus. No evidence of acute infarct, brain edema, shift, or herniation. Lacunar spaces in the bilateral putamen are again noted.   IMPRESSION: 1. Stable volume of subarachnoid hemorrhage. 2. Increased, but still small volume, intraventricular hemorrhage at the lateral ventricles. No hydrocephalus.     CT HEAD WITHOUT CONTRAST   TECHNIQUE: Contiguous axial images were obtained from the base of the skull through the vertex without intravenous contrast.   COMPARISON:  CT 02/20/2014   FINDINGS: High-density subarachnoid hemorrhage remains bilaterally with mild improvement. Slight increase in blood in the occipital horns bilaterally. No new area of hemorrhage identified. Ventricle size is unchanged from prior studies   Mild chronic microvascular ischemic change in the white matter. No acute infarct or mass identified.   IMPRESSION: Mild improvement in subarachnoid hemorrhage. Slight increase in intraventricular blood. Ventricle size is stable without  significant hydrocephalus.   NUCLEAR MEDICINE VENTILATION - PERFUSION LUNG SCAN   TECHNIQUE: Ventilation images were obtained in multiple projections using inhaled aerosol technetium 99 M DTPA. Perfusion images were obtained in multiple projections after intravenous injection of Tc-50m MAA.   RADIOPHARMACEUTICALS:  40 mCi Tc-67m DTPA aerosol and 6 mCi Tc-28m MAA   COMPARISON:  Portable chest radiograph dated 02/19/2014.   FINDINGS: Ventilation: No focal ventilation defect. There is blunting of the costophrenic angles.   Perfusion: No wedge shaped peripheral perfusion defects to suggest acute pulmonary embolism. Matched mild blunting of the costophrenic angles.   IMPRESSION: Low probability nuclear medicine ventilation perfusion scan for pulmonary embolus.  ASSESSMENT/PLAN:  SAH-conservative management. Resume aspirin and Plavix when okayed by a neurosurgeon. CAD-stable Diabetes mellitus with vascular complications-continue Lantus Hypothyroidism-continue levothyroxine Hypertension-blood pressure elevated. Will monitor. COPD-compensated Pulmonary hypertension-no acute symptoms Check CBC and BMP  I have reviewed patient's medical records received at admission/from hospitalization.  CPT CODE: 08657  Gayani Y Dasanayaka, [place] [phone]

## 2014-03-09 ENCOUNTER — Encounter (HOSPITAL_COMMUNITY): Payer: Medicare HMO

## 2014-03-11 ENCOUNTER — Encounter (HOSPITAL_COMMUNITY): Payer: Medicare HMO

## 2014-03-13 ENCOUNTER — Encounter (HOSPITAL_COMMUNITY): Payer: Medicare HMO

## 2014-03-13 ENCOUNTER — Telehealth: Payer: Self-pay | Admitting: *Deleted

## 2014-03-13 NOTE — Telephone Encounter (Signed)
Per Dr. Burt Knack t/w patients nurse [place] @ [facility] [phone], stated pt is having up to 4 second pauses on monitor. Keep monitor on and if pt is asymptomatic hold metoprolol but it pt is having syncopal  episodes and collapsing needs to go to the hospital.  Had nurse repeat instructions back to me to verify,  agreeable to plan.

## 2014-03-16 ENCOUNTER — Encounter: Payer: Self-pay | Admitting: Adult Health

## 2014-03-16 ENCOUNTER — Encounter (HOSPITAL_COMMUNITY): Payer: Medicare HMO

## 2014-03-16 ENCOUNTER — Non-Acute Institutional Stay (SKILLED_NURSING_FACILITY): Payer: Commercial Managed Care - HMO | Admitting: Adult Health

## 2014-03-16 ENCOUNTER — Other Ambulatory Visit: Payer: Self-pay | Admitting: Neurosurgery

## 2014-03-16 DIAGNOSIS — K5900 Constipation, unspecified: Secondary | ICD-10-CM

## 2014-03-16 DIAGNOSIS — I609 Nontraumatic subarachnoid hemorrhage, unspecified: Secondary | ICD-10-CM

## 2014-03-16 DIAGNOSIS — I2510 Atherosclerotic heart disease of native coronary artery without angina pectoris: Secondary | ICD-10-CM

## 2014-03-16 DIAGNOSIS — I10 Essential (primary) hypertension: Secondary | ICD-10-CM

## 2014-03-16 DIAGNOSIS — K219 Gastro-esophageal reflux disease without esophagitis: Secondary | ICD-10-CM

## 2014-03-16 DIAGNOSIS — I6200 Nontraumatic subdural hemorrhage, unspecified: Secondary | ICD-10-CM

## 2014-03-16 DIAGNOSIS — F329 Major depressive disorder, single episode, unspecified: Secondary | ICD-10-CM

## 2014-03-16 DIAGNOSIS — E119 Type 2 diabetes mellitus without complications: Secondary | ICD-10-CM

## 2014-03-16 DIAGNOSIS — F3289 Other specified depressive episodes: Secondary | ICD-10-CM

## 2014-03-16 DIAGNOSIS — E039 Hypothyroidism, unspecified: Secondary | ICD-10-CM

## 2014-03-16 NOTE — Progress Notes (Addendum)
Patient ID: Dana Blackwell, female   DOB: 04-Mar-1928, 80 y.o.   MRN: [id number]              PROGRESS NOTE  DATE:  03/16/14  FACILITY: Nursing Home Location: [facility]  LEVEL OF CARE: SNF (31)  Acute Visit  CHIEF COMPLAINT:  Discharge Notes  HISTORY OF PRESENT ILLNESS:  This is an 80 year old female who is for discharge home with Home health PT, OT, Nursing and Education officer, museum. DME: Rolling walker. She has been admitted to [facility] on 02/27/14 from [facility]. She had a fall and sustained an acute subarachnoid hemorrhage and Fracture of right 1st rib.Patient was admitted to this facility for short-term rehabilitation after the patient's recent hospitalization.  Patient has completed SNF rehabilitation and therapy has cleared the patient for discharge.    REASSESSMENT OF ONGOING PROBLEM(S):  HTN: Pt 's HTN remains stable.  Denies CP, sob, DOE, pedal edema, headaches, dizziness or visual disturbances.  No complications from the medications currently being used.  Last BP : 137/67  CAD: The angina has been stable. The patient denies dyspnea on exertion, orthopnea, pedal edema, palpitations and paroxysmal nocturnal dyspnea. No complications noted from the medication presently being used.  DEPRESSION: The depression remains stable. Patient denies ongoing feelings of sadness, insomnia, anedhonia or lack of appetite. No complications reported from the medications currently being used. Staff do not report behavioral problems.    PAST MEDICAL HISTORY : Reviewed.  No changes/see problem list  CURRENT MEDICATIONS: Reviewed per MAR/see medication list  REVIEW OF SYSTEMS:  GENERAL: no change in appetite, no fatigue, no weight changes, no fever, chills or weakness RESPIRATORY: no cough, SOB, DOE, wheezing, hemoptysis CARDIAC: no chest pain, edema or palpitations GI: no abdominal pain, diarrhea, heart burn, nausea or vomiting, +constipation  PHYSICAL  EXAMINATION  GENERAL: no acute distress, normal body habitus NECK: supple, trachea midline, no neck masses, no thyroid tenderness, no thyromegaly LYMPHATICS: no LAN in the neck, no supraclavicular LAN RESPIRATORY: breathing is even & unlabored, BS CTAB CARDIAC: RRR, no murmur,no extra heart sounds, no edema GI: abdomen soft, normal BS, no masses, no tenderness, no hepatomegaly, no splenomegaly EXTREMITIES: able to move all 4 extremities PSYCHIATRIC: the patient is alert & oriented to person, affect & behavior appropriate  LABS/RADIOLOGY: Labs reviewed: Basic Metabolic Panel:  Recent Labs  02/21/14 0240 02/22/14 0442 02/26/14 0546  NA 140 139 137  K 5.0 4.5 4.3  CL 102 99 99  CO2 27 28 29   GLUCOSE 154* 178* 105*  BUN 22 21 30*  CREATININE 1.11* 1.20* 1.13*  CALCIUM 8.7 9.0 8.7   CBC:  Recent Labs  02/19/14 1505 02/20/14 0250 02/21/14 0240 02/22/14 0442  WBC 7.7 9.5 10.0 9.6  NEUTROABS 5.5  --   --   --   HGB 9.9* 8.6* 8.6* 9.2*  HCT 32.6* 27.7* 28.0* 30.5*  MCV 92.6 92.3 92.7 93.0  PLT 279 265 254 254   Cardiac Enzymes:  Recent Labs  11/23/13 1839 11/24/13 0112 02/19/14 1505  TROPONINI <0.30 <0.30 <0.30   CBG:  Recent Labs  02/27/14 0725 02/27/14 1129 02/27/14 1512  GLUCAP 210* 163* 102*   EXAM: PORTABLE CHEST - 1 VIEW   COMPARISON:  November 23, 2013 the mediastinal contour is normal. The heart size is enlarged. There is bilateral diffuse increased pulmonary interstitium. Minimal bilateral pleural effusions are identified. The visualized skeletal structures are stable.   IMPRESSION: Mild congestive heart failure.  EXAM: CT HEAD WITHOUT CONTRAST   CT CERVICAL SPINE WITHOUT CONTRAST   TECHNIQUE: Multidetector CT imaging of the head and cervical spine was performed following the standard protocol without intravenous contrast. Multiplanar CT image reconstructions of the cervical spine were also generated.   COMPARISON:  None.    FINDINGS: CT HEAD FINDINGS   Moderate to large subarachnoid hemorrhage. There is high-density acute hemorrhage in the basilar cisterns extending into the right sylvian fissure. There is subarachnoid hemorrhage over the convexity bilaterally, right greater than left. No intraventricular hemorrhage. No hydrocephalus.   Negative for acute infarct or mass.  Negative for skull fracture.   CT CERVICAL SPINE FINDINGS   Fracture the right first rib without significant displacement. No apical hematoma identified.   1 mm anterior slip C4-5. Extensive disc degeneration and spondylosis at C3-4 with right-sided facet degeneration. Bilateral facet degeneration at C4-5. Solid fusion at C5-6. Spondylosis at C6-7, C7-T1, and T1-2. Diffuse facet degeneration.   Negative for cervical spine fracture.   IMPRESSION: Moderate to advanced subarachnoid hemorrhage including significant blood in the basilar cisterns. This pattern could be seen with trauma however aneurysm rupture needs to be considered based on the amount of blood in the basilar cisterns.   Nondisplaced fracture right first rib.   Advanced cervical spondylosis.  No cervical spine fracture.   Carotid artery calcification bilaterally.   Critical Value/emergent results were called by telephone at the time of interpretation on 02/19/2014 at 4:16 PM to Dr. Daleen Bo , who verbally acknowledged these results.   EXAM: CT ANGIOGRAPHY HEAD   TECHNIQUE: Multidetector CT imaging of the head was performed using the standard protocol during bolus administration of intravenous contrast. Multiplanar CT image reconstructions and MIPs were obtained to evaluate the vascular anatomy.   CONTRAST:  42mL OMNIPAQUE IOHEXOL 350 MG/ML SOLN   COMPARISON:  CT head without contrast 02/19/2014.   FINDINGS: Dense atherosclerotic calcifications are present within the cavernous carotid arteries bilaterally. The lumen of the right internal carotid  artery is narrowed to less than 1 mm. The lumen of the left internal carotid artery is narrowed to 1.2 mm. The ICA termini are intact bilaterally. The A1 and M1 segments are within normal limits. The MCA bifurcations are intact. There is no significant aneurysm within the anterior circulation. There is some attenuation of distal ACA and MCA branch vessels without a significant proximal stenosis or occlusion.   The left vertebral artery is the dominant vessel. The PICA origins are visualized and normal bilaterally. The right vertebral artery terminates at the PICA. The left vertebral artery continues as the basilar artery with mild proximal narrowing. Both posterior cerebral arteries originate from the basilar tip. The PCA branch vessels are within normal limits bilaterally.   The dural sinuses are patent.  Cortical veins are intact.   Review of the MIP images confirms the above findings.   IMPRESSION: 1. No focal aneurysm to explain the patient's hemorrhage. 2. Dense atherosclerotic calcifications with moderate stenoses in the cavernous ICA segments bilaterally. 3. Mild small vessel disease involving the ACA and MCA branch vessels bilaterally. No significant proximal stenosis or occlusion. EXAM: CT HEAD WITHOUT CONTRAST   TECHNIQUE: Contiguous axial images were obtained from the base of the skull through the vertex without intravenous contrast.   COMPARISON:  CT 02/20/2014   FINDINGS: High-density subarachnoid hemorrhage remains bilaterally with mild improvement. Slight increase in blood in the occipital horns bilaterally. No new area of hemorrhage identified. Ventricle size is unchanged from prior studies  Mild chronic microvascular ischemic change in the white matter. No acute infarct or mass identified.   IMPRESSION: Mild improvement in subarachnoid hemorrhage. Slight increase in intraventricular blood. Ventricle size is stable without  significant hydrocephalus.   ASSESSMENT/PLAN:  Traumatic subarachnoid hemorrhage - conservative management recommended by neuro Fracture of right 1st rib - for Home health PT, OT, Nursing and Social Worker CAD - stable; ASA and PLavix on hold Diabetes Mellitus, type 2 - well controlled; continue Lantus and change Humalog 4 units SQ TIDac routinely - accdg to patient this is how she takes it Hypertension - well controlled; continue lisinopril, Imdur, clonidine and metoprolol GERD - stable; continue Protonix Hypothyroidism - continue Synthroid Depression - continue Zoloft Constipation - continue Senokot and Dulcolax when necessary  I have filled out patient's discharge paperwork and written prescriptions.  Patient will receive home health PT, OT, Social Woker and Nursing.     DME provided: Rolling walker  Total discharge time: Greater than 30 minutes  Discharge time involved coordination of the discharge process with Education officer, museum, nursing staff and therapy department. Medical justification for home health services/DME verified.   CPT CODE: 31497   Seth Bake - NP [facility] [phone]

## 2014-03-17 ENCOUNTER — Ambulatory Visit
Admission: RE | Admit: 2014-03-17 | Discharge: 2014-03-17 | Disposition: A | Discharge status: Home / Self Care | Payer: Commercial Managed Care - HMO | Source: Ambulatory Visit | Attending: Neurosurgery | Admitting: Neurosurgery

## 2014-03-17 DIAGNOSIS — I6200 Nontraumatic subdural hemorrhage, unspecified: Secondary | ICD-10-CM

## 2014-03-18 ENCOUNTER — Encounter (HOSPITAL_COMMUNITY): Payer: Medicare HMO

## 2014-03-19 ENCOUNTER — Telehealth: Payer: Self-pay | Admitting: Cardiovascular Disease

## 2014-03-19 NOTE — Telephone Encounter (Signed)
°  Pt's caregiver would like results of CT scan. And has questions about coumadin. Please call and advise.

## 2014-03-19 NOTE — Telephone Encounter (Signed)
Left pt's caregiver a message to call back.

## 2014-03-20 ENCOUNTER — Encounter (HOSPITAL_COMMUNITY): Payer: Medicare HMO

## 2014-03-20 NOTE — Telephone Encounter (Signed)
Left message for call back to triage nurse

## 2014-03-23 ENCOUNTER — Encounter (HOSPITAL_COMMUNITY): Payer: Medicare HMO

## 2014-03-23 NOTE — Telephone Encounter (Signed)
Spoke with patient and advised that we have not been able to get in touch with patient's caregiver, Dana Blackwell, who called last week.  Patient states caregiver may have called to get results of CT scan. Patient reports Dr. Maudie Mercury called and advised her that the bleeding on her brain has slowed down.  I asked patient if she had any other questions or concerns and reminded her of her appointment with Dr. Burt Knack this Friday 8/7 at 3:00; patient denies questions or concerns. Patient states she is writing appointment time down.  I left message for Dana Blackwell and advised her to call with questions or concerns.

## 2014-03-25 ENCOUNTER — Encounter (HOSPITAL_COMMUNITY): Payer: Medicare HMO

## 2014-03-27 ENCOUNTER — Ambulatory Visit (INDEPENDENT_AMBULATORY_CARE_PROVIDER_SITE_OTHER): Payer: Medicare HMO | Admitting: Cardiovascular Disease

## 2014-03-27 ENCOUNTER — Encounter: Payer: Self-pay | Admitting: Cardiovascular Disease

## 2014-03-27 ENCOUNTER — Encounter (HOSPITAL_COMMUNITY): Payer: Medicare HMO

## 2014-03-27 VITALS — BP 138/80 | HR 86 | Wt 134.0 lb

## 2014-03-27 DIAGNOSIS — R0602 Shortness of breath: Secondary | ICD-10-CM

## 2014-03-27 MED ORDER — FUROSEMIDE 40 MG PO TABS: 40.0000 mg | ORAL_TABLET | Freq: Every day | ORAL | Status: DC

## 2014-03-27 NOTE — Patient Instructions (Signed)
Your physician has recommended you make the following change in your medication: INCREASE Furosemide to 40mg  take one by mouth daily  Your physician recommends that you return for lab work in: 3 WEEKS (BMP and BNP)  Your physician recommends that you schedule a follow-up appointment in: 4 WEEKS with PA/NP

## 2014-03-27 NOTE — Progress Notes (Signed)
HPI:  80 year old woman presenting for followup evaluation. The patient has been followed for coronary artery disease. She had been managed medically for moderate diffuse multivessel disease over several years. She developed symptoms of unstable angina and ruled in for non-STEMI in April 2015. She was found to have severe proximal LAD stenosis and ultimately underwent rotational atherectomy and stenting of the proximal LAD with a drug-eluting stent. The patient was hospitalized July 2 through July 10 after a fall. She unfortunately developed a large subarachnoid hemorrhage. She was managed conservatively and was kept off of her antiplatelet therapy. She recovered in skilled nursing and is now been discharged back home. A followup CT scan of the brain July 28 demonstrated decrease in the amount of subarachnoid blood. The patient was discharged from the hospital with a cardiac event monitor. This demonstrated some pauses of about 3.5 seconds. I was made aware of this and stopped her beta blocker. She's had no further pauses documented.  The patient is continuing a slow recovery. She was discharged from the skilled nursing facility approximately one week ago. She remains fatigued and weak. She complains of shortness of breath with activity. She has a mild ache in her chest at times. She feels that oxygen is helping her. She denies edema, orthopnea, or PND.  Outpatient Encounter Prescriptions as of 03/27/2014  Medication Sig  . acetaminophen (TYLENOL) 650 MG CR tablet Take 650 mg by mouth daily.   Marland Kitchen ALPRAZolam (XANAX) 0.25 MG tablet Take 1 tablet (0.25 mg total) by mouth 3 (three) times daily as needed for anxiety.  Marland Kitchen atorvastatin (LIPITOR) 20 MG tablet Take 20 mg by mouth daily at 6 PM.   . beta carotene w/minerals (OCUVITE) tablet Take 1 tablet by mouth at bedtime.   . bisacodyl (DULCOLAX) 5 MG EC tablet Take 2 tablets (10 mg total) by mouth daily as needed for moderate constipation.  . calcium  carbonate (OS-CAL) 600 MG TABS tablet Take 600 mg by mouth daily.  . cloNIDine (CATAPRES) 0.1 MG tablet Take 1 tablet (0.1 mg total) by mouth 2 (two) times daily.  . Cyanocobalamin (VITAMIN B-12 PO) Take 1 tablet by mouth daily.  . fish oil-omega-3 fatty acids 1000 MG capsule Take 1 g by mouth daily.   . folic acid-pyridoxine-cyancobalamin (FOLTX) 2.5-25-2 MG TABS Take 1 tablet by mouth daily.   . furosemide (LASIX) 20 MG tablet Take 20 mg by mouth daily.  Marland Kitchen HYDROcodone-acetaminophen (NORCO/VICODIN) 5-325 MG per tablet Take 1 tablet by mouth every 6 (six) hours as needed for moderate pain.  Marland Kitchen insulin glargine (LANTUS) 100 UNIT/ML injection Inject 0.1 mLs (10 Units total) into the skin at bedtime.  . insulin lispro (HUMALOG) 100 UNIT/ML injection Inject 4-5 Units into the skin 3 (three) times daily before meals.  . isosorbide mononitrate (IMDUR) 30 MG 24 hr tablet Take 1 tablet (30 mg total) by mouth daily.  Marland Kitchen levothyroxine (SYNTHROID, LEVOTHROID) 75 MCG tablet Take 75 mcg by mouth daily.    Marland Kitchen lisinopril (PRINIVIL,ZESTRIL) 5 MG tablet Take 1 tablet (5 mg total) by mouth daily.  Marland Kitchen MAGNESIUM PO Take 1 tablet by mouth at bedtime.   . metoprolol (TOPROL-XL) 100 MG 24 hr tablet Take 100 mg by mouth every morning.   . Misc. Devices (HEART RATE MONITOR) MISC 1 Device by Does not apply route as directed. Wear continuously for 30 days  . pantoprazole (PROTONIX) 40 MG tablet Take 40 mg by mouth daily.    Marland Kitchen senna-docusate (SENOKOT-S) 8.6-50 MG  per tablet Take 1 tablet by mouth daily.  . sertraline (ZOLOFT) 100 MG tablet Take 50 mg by mouth daily.   . traMADol (ULTRAM) 50 MG tablet Take 1-2 tablets (50-100 mg total) by mouth every 6 (six) hours as needed (50mg  for mild pain, 75mg  for moderate pain, 100mg  for severe pain).  . [DISCONTINUED] isosorbide dinitrate (ISORDIL) 30 MG tablet Take 30 mg by mouth daily.   . [DISCONTINUED] mometasone (ELOCON) 0.1 % ointment   . [DISCONTINUED] trimethoprim (TRIMPEX) 100  MG tablet   . [DISCONTINUED] aspirin 81 MG EC tablet Take 1 tablet (81 mg total) by mouth daily.  . [DISCONTINUED] clopidogrel (PLAVIX) 75 MG tablet Take 1 tablet (75 mg total) by mouth daily with breakfast.  . [DISCONTINUED] hydrocodone-acetaminophen (LORCET-HD) 5-500 MG per capsule Take 1 capsule by mouth daily.   . [DISCONTINUED] lidocaine (LIDODERM) 5 % Place 1 patch onto the skin daily. Remove & Discard patch within 12 hours or as directed by MD  . [DISCONTINUED] nitrofurantoin (MACRODANTIN) 100 MG capsule Take 100 mg by mouth daily.  . [DISCONTINUED] predniSONE (DELTASONE) 5 MG tablet Take 5 mg by mouth daily with breakfast.    Allergies  Allergen Reactions  . Tetanus Toxoid Other (See Comments)    unknown    Past Medical History  Diagnosis Date  . Coronary artery disease     11/2012: s/p 3.0 x 28 mm Xience and 2.5 x 28 mm Xience stents to the LAD    . Hypertension   . Hyperlipidemia   . Stroke     hx of  . Obesity   . Heart murmur   . Diabetes mellitus     type II  . Hypothyroidism   . Esophageal stricture   . Gastritis   . Nausea and vomiting   . Diarrhea   . Abdominal pain   . Esophageal reflux   . Depression   . Uterine cancer   . H/O: hysterectomy     ROS: Negative except as per HPI  BP 138/80  Pulse 86  Wt 134 lb (60.782 kg)  PHYSICAL EXAM: Pt is alert and oriented, pleasant elderly woman in NAD HEENT: normal Neck: JVP - normal, carotids 2+=  Lungs: Bibasilar rales, otherwise CTA bilaterally CV: RRR without murmur or gallop Abd: soft, NT, Positive BS, no hepatomegaly Ext: no C/C/E, distal pulses intact and equal Skin: warm/dry no rash  CT Brain 7/28: FINDINGS:  Decrease in amount of subarachnoid blood. There remains a small  amount of subarachnoid blood in the right frontal sulci. This may  represent slow clearance of prior subarachnoid blood rather than  interval rebleed (which is not of clinical concern per discussion  with Dr. Hal Neer).    Interval clearing of ventricular blood.  Small vessel disease type changes without CT evidence of large acute  infarct.  Global atrophy. Ventricular prominence probably related to atrophy  without hydrocephalus.  No intracranial mass lesion noted on this unenhanced exam.  IMPRESSION:  Decrease in amount of subarachnoid blood. There remains a small  amount of subarachnoid blood in the right frontal sulci.  Interval clearing of ventricular blood.   ASSESSMENT AND PLAN: 1. Coronary artery disease, native vessel. The patient describes some symptoms of angina. Difficult to know if this is related to congestive heart failure with elevated filling pressures, recurrence of obstructive CAD, or deconditioning. She has been quite ill after a large subarachnoid hemorrhage. She is off of antiplatelet therapy. I have a call in to neurosurgery and will  await their recommendations about whether we can resume aspirin and Plavix after coronary stenting was performed about 4 months ago.  2. Shortness of breath, suspect combination of deconditioning and diastolic heart failure. Recommended that she increase furosemide to 40 mg daily. I would like her to return in 4 weeks for a metabolic panel, BNP, and office visit. She will otherwise continue her current medical program.  3. Hypertension. Blood pressure is controlled will isosorbide, lisinopril, clonidine, and metoprolol succinate  4. Bradycardia and pauses. Resolved after discontinuation of beta blocker. Will wait to review her event monitor which she brought in with her today.  Sherren Mocha 03/27/2014 3:40 PM

## 2014-04-13 ENCOUNTER — Other Ambulatory Visit (INDEPENDENT_AMBULATORY_CARE_PROVIDER_SITE_OTHER): Payer: Medicare HMO

## 2014-04-13 DIAGNOSIS — R0602 Shortness of breath: Secondary | ICD-10-CM

## 2014-04-13 LAB — BASIC METABOLIC PANEL
BUN: 23 mg/dL (ref 6–23)
CO2: 25 mEq/L (ref 19–32)
CREATININE: 1.3 mg/dL — AB (ref 0.4–1.2)
Calcium: 9.4 mg/dL (ref 8.4–10.5)
Chloride: 104 mEq/L (ref 96–112)
GFR: 42.21 mL/min — AB (ref >60.00)
Glucose, Bld: 143 mg/dL — ABNORMAL HIGH (ref 70–99)
Potassium: 4.6 mEq/L (ref 3.5–5.1)
SODIUM: 141 meq/L (ref 135–145)

## 2014-04-13 LAB — BRAIN NATRIURETIC PEPTIDE: Pro B Natriuretic peptide (BNP): 351 pg/mL — ABNORMAL HIGH (ref 0.0–100.0)

## 2014-04-17 NOTE — Progress Notes (Signed)
Patient ID: Dana Blackwell, female   DOB: November 11, 1927, 80 y.o.   MRN: [id number] I spoke to the pt and per Dr Burt Knack he would like the pt to restart Aspirin 81mg  take one by mouth daily. Pt agreed with plan and will keep appointment scheduled on 04/22/14.

## 2014-04-22 ENCOUNTER — Telehealth: Payer: Self-pay | Admitting: Cardiovascular Disease

## 2014-04-22 ENCOUNTER — Ambulatory Visit (INDEPENDENT_AMBULATORY_CARE_PROVIDER_SITE_OTHER): Payer: Commercial Managed Care - HMO | Admitting: Physician Assistant

## 2014-04-22 ENCOUNTER — Encounter: Payer: Self-pay | Admitting: Physician Assistant

## 2014-04-22 VITALS — BP 130/70 | HR 56 | Ht 71.0 in | Wt 135.0 lb

## 2014-04-22 DIAGNOSIS — I2510 Atherosclerotic heart disease of native coronary artery without angina pectoris: Secondary | ICD-10-CM

## 2014-04-22 DIAGNOSIS — K219 Gastro-esophageal reflux disease without esophagitis: Secondary | ICD-10-CM

## 2014-04-22 DIAGNOSIS — E785 Hyperlipidemia, unspecified: Secondary | ICD-10-CM

## 2014-04-22 DIAGNOSIS — I6529 Occlusion and stenosis of unspecified carotid artery: Secondary | ICD-10-CM

## 2014-04-22 DIAGNOSIS — I10 Essential (primary) hypertension: Secondary | ICD-10-CM

## 2014-04-22 DIAGNOSIS — I5032 Chronic diastolic (congestive) heart failure: Secondary | ICD-10-CM | POA: Insufficient documentation

## 2014-04-22 MED ORDER — ISOSORBIDE MONONITRATE ER 60 MG PO TB24: 60.0000 mg | ORAL_TABLET | Freq: Every day | ORAL | Status: AC

## 2014-04-22 NOTE — Patient Instructions (Signed)
Your physician has recommended you make the following change in your medication:  1. INCREASE IMDUR TO 60 MG DAILY; NEW RX WAS SENT IN FOR THE 60 MG TABLET  Your physician recommends that you schedule a follow-up appointment in: 05/25/14 2:20 WITH SCOTT [facility]ER, PAC SAME DAY DR. Burt Knack IS IN THE OFFICE  WHEN YOU GET HOME TODAY LOOK AND SEE IF YOU ARE OR ARE NOT TAKING THE TOPROL XL AND PLEASE CALL THE OFFICE [phone] TO SCOTT OR CAROL TO ADVISE

## 2014-04-22 NOTE — Progress Notes (Signed)
Cardiology Office Note    Date:  04/22/2014   ID:  Dana Blackwell, DOB 1927-12-18, MRN [id number]  PCP:  Dana Gravel, MD  Cardiologist:  Dr. Sherren Mocha     History of Present Illness: Dana Blackwell is a 80 y.o. female with a hx of multivessel CAD s/p NSTEMI Rx DES x 2 to LAD in 11/2013, DM2, HTN, HL.  She was admitted in 02/2014 after a fall resulting in a large subarachnoid hemorrhage.  Antiplatelet Rx was held.  Event monitor after DC demonstrated 3.5 sec pauses.  Beta blocker Rx was adjusted and there were no further pauses.  She saw Dr. Sherren Mocha 03/27/14 one week after DC from SNF.  She complained of DOE and her Lasix was increased.  She returns for FU.  Since last seen, she feels that her strength has improved. LE edema has also improved. She remainx short of breath. She describes NYHA 2b-3 symptoms. She denies orthopnea. She did awaken 2 nights ago with dyspnea and associated chest tightness. She does have occasional chest tightness with activity. This has been fairly stable over the last 4 months. She denies syncope.   Studies:  - LHC (4/15):  prox LAD 80% then diffuse 50%, ostial RI 95% (small vessel), prox CFX 70% then 30-40% after OM, dist CFX 80%, ostial RCA 70%, prox RCA 80%, EF 60% >>> PCI: 3.0 x 28 mm Xience and 2.5 x 28 mm Xience DES to LAD  - Echo (7/15):  EF 60-65%, Gr 2 DD, MAC, mild LAE, mild RAE, PASP 75 mmHg  - Carotid US (96/75):  RICA 9-16%; LICA 38-46% >>> FU 1 year   Recent Labs/Images: 02/22/2014: Hemoglobin 9.2*  04/13/2014: Creatinine 1.3*; Potassium 4.6; Pro B Natriuretic peptide (BNP) 351.0*   Ct Head Wo Contrast   03/17/2014    IMPRESSION: Decrease in amount of subarachnoid blood. There remains a small amount of subarachnoid blood in the right frontal sulci.  Interval clearing of ventricular blood.  These results were called by telephone at the time of interpretation on 03/17/2014 at 4:53 pm to [name] , who verbally acknowledged  these results.   Electronically Signed   By: [name]   On: 03/17/2014 16:58     Wt Readings from Last 3 Encounters:  04/22/14 135 lb (61.236 kg)  03/27/14 134 lb (60.782 kg)  03/16/14 146 lb (66.225 kg)     Past Medical History  Diagnosis Date  . Coronary artery disease     11/2012: s/p 3.0 x 28 mm Xience and 2.5 x 28 mm Xience stents to the LAD    . Hypertension   . Hyperlipidemia   . Stroke     hx of  . Obesity   . Heart murmur   . Diabetes mellitus     type II  . Hypothyroidism   . Esophageal stricture   . Gastritis   . Nausea and vomiting   . Diarrhea   . Abdominal pain   . Esophageal reflux   . Depression   . Uterine cancer   . H/O: hysterectomy     Current Outpatient Prescriptions  Medication Sig Dispense Refill  . acetaminophen (TYLENOL) 650 MG CR tablet Take 650 mg by mouth daily.       Marland Kitchen ALPRAZolam (XANAX) 0.25 MG tablet Take 1 tablet (0.25 mg total) by mouth 3 (three) times daily as needed for anxiety.  30 tablet  0  . aspirin 81 MG tablet Take 81  mg by mouth daily.      Marland Kitchen atorvastatin (LIPITOR) 20 MG tablet Take 20 mg by mouth daily at 6 PM.       . beta carotene w/minerals (OCUVITE) tablet Take 1 tablet by mouth at bedtime.       . bisacodyl (DULCOLAX) 5 MG EC tablet Take 2 tablets (10 mg total) by mouth daily as needed for moderate constipation.  30 tablet  0  . calcium carbonate (OS-CAL) 600 MG TABS tablet Take 600 mg by mouth daily.      . cloNIDine (CATAPRES) 0.1 MG tablet Take 1 tablet (0.1 mg total) by mouth 2 (two) times daily.  60 tablet  11  . Cyanocobalamin (VITAMIN B-12 PO) Take 1 tablet by mouth daily.      . fish oil-omega-3 fatty acids 1000 MG capsule Take 1 g by mouth daily.       . folic acid-pyridoxine-cyancobalamin (FOLTX) 2.5-25-2 MG TABS Take 1 tablet by mouth daily.       . furosemide (LASIX) 40 MG tablet Take 1 tablet (40 mg total) by mouth daily.  30 tablet  4  . HYDROcodone-acetaminophen (NORCO/VICODIN) 5-325 MG per tablet  Take 1 tablet by mouth every 6 (six) hours as needed for moderate pain.  30 tablet  0  . insulin glargine (LANTUS) 100 UNIT/ML injection Inject 0.1 mLs (10 Units total) into the skin at bedtime.  10 mL  11  . insulin lispro (HUMALOG) 100 UNIT/ML injection Inject 4-5 Units into the skin 3 (three) times daily before meals.      . isosorbide mononitrate (IMDUR) 30 MG 24 hr tablet Take 1 tablet (30 mg total) by mouth daily.  30 tablet  11  . levothyroxine (SYNTHROID, LEVOTHROID) 75 MCG tablet Take 75 mcg by mouth daily.        Marland Kitchen lisinopril (PRINIVIL,ZESTRIL) 5 MG tablet Take 1 tablet (5 mg total) by mouth daily.  30 tablet  11  . MAGNESIUM PO Take 1 tablet by mouth at bedtime.       . metoprolol (TOPROL-XL) 100 MG 24 hr tablet Take 100 mg by mouth every morning.       . Misc. Devices (HEART RATE MONITOR) MISC 1 Device by Does not apply route as directed. Wear continuously for 30 days  1 each  0  . pantoprazole (PROTONIX) 40 MG tablet Take 40 mg by mouth daily.        Marland Kitchen senna-docusate (SENOKOT-S) 8.6-50 MG per tablet Take 1 tablet by mouth daily.      . sertraline (ZOLOFT) 100 MG tablet Take 50 mg by mouth daily.       . traMADol (ULTRAM) 50 MG tablet Take 1-2 tablets (50-100 mg total) by mouth every 6 (six) hours as needed (50mg  for mild pain, 75mg  for moderate pain, 100mg  for severe pain).  30 tablet  0   No current facility-administered medications for this visit.     Allergies:   Tetanus toxoid   Social History:  The patient  reports that she has never smoked. She does not have any smokeless tobacco history on file. She reports that she does not drink alcohol or use illicit drugs.   Family History:  The patient's family history includes Coronary artery disease in an other family member; Heart attack in her brother and sister; Heart attack (age of onset: 54) in her mother.   ROS:  Please see the history of present illness.   She denies significant cough. She does note  occasional dysphagia. She  did take antiacids the other night for chest discomfort with improvement.   All other systems reviewed and negative.   PHYSICAL EXAM: VS:  BP 130/70  Pulse 56  Ht 5\' 11"  (1.803 m)  Wt 135 lb (61.236 kg)  BMI 18.84 kg/m2 Well nourished, well developed, in no acute distress HEENT: normal Neck: no JVDat 90 Cardiac:  normal S1, S2; RRR; no murmur Lungs:  clear to auscultation bilaterally, no wheezing, rhonchi or rales Abd: soft, nontender, no hepatomegaly Ext: no edema Skin: warm and dry Neuro:  CNs 2-12 intact, no focal abnormalities noted  EKG:  Sinus bradycardia, HR 56, normal axis, inferior T wave inversions     ASSESSMENT AND PLAN:  1. Chronic diastolic heart failure: Overall volume stable. LE edema is improved on higher dose Lasix. Recent BNP was just minimally elevated. Continue current dose of diuretics. 2. Coronary Artery Disease: Continued dyspnea as well as exertional chest discomfort may likely be angina from residual CAD. She also has symptoms of acid reflux. Continue current dose of diuretic. I will adjust her nitrates by increasing Imdur to 60 mg daily. Return for close follow up. 3. HYPERTENSION: Controlled. 4. HYPERLIPIDEMIA: Continue statin. 5. CAROTID ARTERY DISEASE- moderate LICA 03-88% doppler Nov 2014: Followup carotid US due in 06/2014. 6. Gastroesophageal reflux disease, esophagitis presence not specified: Continue PPI. Follow up with primary care. 7. Bradycardia: Toprol is again listed on her medications. I have asked her to review this when she gets home and to contact us with her exact list.  Disposition:  Follow up with Dr. Burt Knack or me in one month.   Signed, Versie Starks, MHS 04/22/2014 2:16 PM    [facility] [address], [place], [place]  [address] Phone: [phone]; Fax: [phone]

## 2014-04-22 NOTE — Telephone Encounter (Signed)
Ronalee Belts When she last saw you, it was presumed she was off of the beta blocker due to sinus pause on her monitor. She had a hx of syncope >>> fall and SAH in 7/15. However, she is still taking it. Should we go ahead and stop it? Richardson Dopp, PA-C   04/22/2014 4:35 PM

## 2014-04-22 NOTE — Telephone Encounter (Signed)
New message     Pt saw Richardson Dopp today but she want to talk to Dr Antionette Char nurse.  Pt would not tell me what she wanted

## 2014-04-22 NOTE — Telephone Encounter (Signed)
Called to advise [place] that she is taking Metoprolol Succ 100 mg daily. Will forward to [facility] as she was seen today

## 2014-04-22 NOTE — Telephone Encounter (Signed)
Yeah - she is supposed to be off it. Also - I've tried to call Dr Hal Neer 3 times and he has never returned my call about her restarting antiplatelet drugs. I just put her back on low-dose ASA a few days ago and thought I would not restart plavix since she had the bleed and is approaching 6 months out from PCI. thx

## 2014-04-23 ENCOUNTER — Telehealth: Payer: Self-pay | Admitting: *Deleted

## 2014-04-23 NOTE — Telephone Encounter (Signed)
pt notified per Dr. Burt Knack and Brynda Rim. PA to stop taking the toprol . Pt asked for me to spell medication to her so she could find the correct bottle to pull out from her medication cabinet.

## 2014-04-23 NOTE — Telephone Encounter (Signed)
Reviewed with Dr. Sherren Mocha  Please tell Tianne Plott Myers-Wagoner to stop taking Toprol. Thanks Richardson Dopp, PA-C   04/23/2014 1:55 PM

## 2014-04-24 ENCOUNTER — Telehealth: Payer: Self-pay | Admitting: Cardiovascular Disease

## 2014-04-24 NOTE — Telephone Encounter (Signed)
Left message on machine for Tiffany to contact the office.

## 2014-04-24 NOTE — Telephone Encounter (Signed)
New problem    FYI: want to know if you had spoken back to pt since her last visit concerning her medication. Please call Tiffany.

## 2014-04-28 NOTE — Telephone Encounter (Signed)
New problem:    Per Miss Owens Shark RN with [facility] pt uesed her Nytrogy 2 days ago and again today for CP.  Per RN pt still not feeling well BP 170/100. Per RN pt needs an appointment.

## 2014-04-28 NOTE — Telephone Encounter (Signed)
Left message for call back.

## 2014-04-29 ENCOUNTER — Encounter (HOSPITAL_COMMUNITY)
Admission: EM | Disposition: A | Discharge status: Home / Self Care | Payer: Commercial Managed Care - HMO | Source: Home / Self Care | Attending: Cardiology

## 2014-04-29 ENCOUNTER — Emergency Department (HOSPITAL_COMMUNITY): Payer: Medicare HMO

## 2014-04-29 ENCOUNTER — Inpatient Hospital Stay (HOSPITAL_COMMUNITY)
Admission: EM | Admit: 2014-04-29 | Discharge: 2014-05-02 | DRG: 247 | Disposition: A | Discharge status: Home / Self Care | Payer: Medicare HMO | Attending: Cardiology | Admitting: Cardiology

## 2014-04-29 ENCOUNTER — Encounter (HOSPITAL_COMMUNITY): Payer: Self-pay | Admitting: Emergency Medicine

## 2014-04-29 DIAGNOSIS — K5900 Constipation, unspecified: Secondary | ICD-10-CM | POA: Diagnosis present

## 2014-04-29 DIAGNOSIS — D638 Anemia in other chronic diseases classified elsewhere: Secondary | ICD-10-CM | POA: Diagnosis present

## 2014-04-29 DIAGNOSIS — I214 Non-ST elevation (NSTEMI) myocardial infarction: Secondary | ICD-10-CM | POA: Diagnosis not present

## 2014-04-29 DIAGNOSIS — I498 Other specified cardiac arrhythmias: Secondary | ICD-10-CM | POA: Diagnosis not present

## 2014-04-29 DIAGNOSIS — Z90710 Acquired absence of both cervix and uterus: Secondary | ICD-10-CM

## 2014-04-29 DIAGNOSIS — Z6828 Body mass index (BMI) 28.0-28.9, adult: Secondary | ICD-10-CM

## 2014-04-29 DIAGNOSIS — E785 Hyperlipidemia, unspecified: Secondary | ICD-10-CM | POA: Diagnosis present

## 2014-04-29 DIAGNOSIS — I509 Heart failure, unspecified: Secondary | ICD-10-CM | POA: Diagnosis present

## 2014-04-29 DIAGNOSIS — N183 Chronic kidney disease, stage 3 unspecified: Secondary | ICD-10-CM

## 2014-04-29 DIAGNOSIS — E1059 Type 1 diabetes mellitus with other circulatory complications: Secondary | ICD-10-CM | POA: Diagnosis present

## 2014-04-29 DIAGNOSIS — F3289 Other specified depressive episodes: Secondary | ICD-10-CM | POA: Diagnosis present

## 2014-04-29 DIAGNOSIS — T82897A Other specified complication of cardiac prosthetic devices, implants and grafts, initial encounter: Secondary | ICD-10-CM | POA: Diagnosis present

## 2014-04-29 DIAGNOSIS — Y831 Surgical operation with implant of artificial internal device as the cause of abnormal reaction of the patient, or of later complication, without mention of misadventure at the time of the procedure: Secondary | ICD-10-CM | POA: Diagnosis present

## 2014-04-29 DIAGNOSIS — F411 Generalized anxiety disorder: Secondary | ICD-10-CM | POA: Diagnosis present

## 2014-04-29 DIAGNOSIS — Z8673 Personal history of transient ischemic attack (TIA), and cerebral infarction without residual deficits: Secondary | ICD-10-CM | POA: Diagnosis not present

## 2014-04-29 DIAGNOSIS — E871 Hypo-osmolality and hyponatremia: Secondary | ICD-10-CM | POA: Diagnosis not present

## 2014-04-29 DIAGNOSIS — Z8249 Family history of ischemic heart disease and other diseases of the circulatory system: Secondary | ICD-10-CM | POA: Diagnosis not present

## 2014-04-29 DIAGNOSIS — Z794 Long term (current) use of insulin: Secondary | ICD-10-CM

## 2014-04-29 DIAGNOSIS — I2789 Other specified pulmonary heart diseases: Secondary | ICD-10-CM | POA: Diagnosis present

## 2014-04-29 DIAGNOSIS — F329 Major depressive disorder, single episode, unspecified: Secondary | ICD-10-CM | POA: Diagnosis present

## 2014-04-29 DIAGNOSIS — I798 Other disorders of arteries, arterioles and capillaries in diseases classified elsewhere: Secondary | ICD-10-CM | POA: Diagnosis present

## 2014-04-29 DIAGNOSIS — I252 Old myocardial infarction: Secondary | ICD-10-CM

## 2014-04-29 DIAGNOSIS — I2510 Atherosclerotic heart disease of native coronary artery without angina pectoris: Secondary | ICD-10-CM | POA: Diagnosis present

## 2014-04-29 DIAGNOSIS — E669 Obesity, unspecified: Secondary | ICD-10-CM | POA: Diagnosis present

## 2014-04-29 DIAGNOSIS — N189 Chronic kidney disease, unspecified: Secondary | ICD-10-CM | POA: Diagnosis present

## 2014-04-29 DIAGNOSIS — Z7982 Long term (current) use of aspirin: Secondary | ICD-10-CM

## 2014-04-29 DIAGNOSIS — E119 Type 2 diabetes mellitus without complications: Secondary | ICD-10-CM

## 2014-04-29 DIAGNOSIS — I10 Essential (primary) hypertension: Secondary | ICD-10-CM | POA: Diagnosis present

## 2014-04-29 DIAGNOSIS — E039 Hypothyroidism, unspecified: Secondary | ICD-10-CM | POA: Diagnosis present

## 2014-04-29 DIAGNOSIS — I129 Hypertensive chronic kidney disease with stage 1 through stage 4 chronic kidney disease, or unspecified chronic kidney disease: Secondary | ICD-10-CM | POA: Diagnosis present

## 2014-04-29 DIAGNOSIS — D649 Anemia, unspecified: Secondary | ICD-10-CM | POA: Diagnosis present

## 2014-04-29 DIAGNOSIS — I609 Nontraumatic subarachnoid hemorrhage, unspecified: Secondary | ICD-10-CM

## 2014-04-29 DIAGNOSIS — I5032 Chronic diastolic (congestive) heart failure: Secondary | ICD-10-CM

## 2014-04-29 DIAGNOSIS — Z79899 Other long term (current) drug therapy: Secondary | ICD-10-CM

## 2014-04-29 DIAGNOSIS — E1051 Type 1 diabetes mellitus with diabetic peripheral angiopathy without gangrene: Secondary | ICD-10-CM | POA: Diagnosis present

## 2014-04-29 HISTORY — PX: LEFT HEART CATHETERIZATION WITH CORONARY ANGIOGRAM: SHX5451

## 2014-04-29 LAB — CREATININE, SERUM
Creatinine, Ser: 0.99 mg/dL (ref 0.50–1.10)
GFR calc Af Amer: 59 mL/min — ABNORMAL LOW (ref >90)
GFR calc non Af Amer: 51 mL/min — ABNORMAL LOW (ref >90)

## 2014-04-29 LAB — CK TOTAL AND CKMB (NOT AT ARMC)
CK, MB: 18.2 ng/mL (ref 0.3–4.0)
Relative Index: 8.3 — ABNORMAL HIGH (ref 0.0–2.5)
Total CK: 218 U/L — ABNORMAL HIGH (ref 7–177)

## 2014-04-29 LAB — TROPONIN I: TROPONIN I: 3.88 ng/mL — AB (ref <0.30)

## 2014-04-29 LAB — BASIC METABOLIC PANEL
Anion gap: 10 (ref 5–15)
BUN: 22 mg/dL (ref 6–23)
CALCIUM: 9.1 mg/dL (ref 8.4–10.5)
CO2: 30 meq/L (ref 19–32)
CREATININE: 1.33 mg/dL — AB (ref 0.50–1.10)
Chloride: 102 mEq/L (ref 96–112)
GFR calc non Af Amer: 36 mL/min — ABNORMAL LOW (ref >90)
GFR, EST AFRICAN AMERICAN: 41 mL/min — AB (ref >90)
Glucose, Bld: 205 mg/dL — ABNORMAL HIGH (ref 70–99)
Potassium: 4.5 mEq/L (ref 3.7–5.3)
Sodium: 142 mEq/L (ref 137–147)

## 2014-04-29 LAB — GLUCOSE, CAPILLARY
GLUCOSE-CAPILLARY: 202 mg/dL — AB (ref 70–99)
GLUCOSE-CAPILLARY: 208 mg/dL — AB (ref 70–99)
Glucose-Capillary: 197 mg/dL — ABNORMAL HIGH (ref 70–99)
Glucose-Capillary: 302 mg/dL — ABNORMAL HIGH (ref 70–99)

## 2014-04-29 LAB — CBC
HCT: 33.9 % — ABNORMAL LOW (ref 36.0–46.0)
HCT: 36 % (ref 36.0–46.0)
Hemoglobin: 10.7 g/dL — ABNORMAL LOW (ref 12.0–15.0)
Hemoglobin: 11.2 g/dL — ABNORMAL LOW (ref 12.0–15.0)
MCH: 27.2 pg (ref 26.0–34.0)
MCH: 27.1 pg (ref 26.0–34.0)
MCHC: 31.6 g/dL (ref 30.0–36.0)
MCHC: 31.1 g/dL (ref 30.0–36.0)
MCV: 86.3 fL (ref 78.0–100.0)
MCV: 87.2 fL (ref 78.0–100.0)
PLATELETS: 319 10*3/uL (ref 150–400)
Platelets: 274 10*3/uL (ref 150–400)
RBC: 3.93 MIL/uL (ref 3.87–5.11)
RBC: 4.13 MIL/uL (ref 3.87–5.11)
RDW: 15.4 % (ref 11.5–15.5)
RDW: 15.6 % — ABNORMAL HIGH (ref 11.5–15.5)
WBC: 9.2 10*3/uL (ref 4.0–10.5)
WBC: 8.1 10*3/uL (ref 4.0–10.5)

## 2014-04-29 LAB — PROTIME-INR
INR: 1.14 (ref 0.00–1.49)
PROTHROMBIN TIME: 14.6 s (ref 11.6–15.2)

## 2014-04-29 LAB — POCT ACTIVATED CLOTTING TIME
Activated Clotting Time: 321 seconds
Activated Clotting Time: 382 seconds

## 2014-04-29 LAB — I-STAT TROPONIN, ED: Troponin i, poc: 0.48 ng/mL (ref 0.00–0.08)

## 2014-04-29 LAB — MRSA PCR SCREENING: MRSA by PCR: NEGATIVE

## 2014-04-29 SURGERY — LEFT HEART CATHETERIZATION WITH CORONARY ANGIOGRAM
Anesthesia: LOCAL

## 2014-04-29 MED ORDER — SODIUM CHLORIDE 0.9 % IV SOLN: INTRAVENOUS | Status: DC

## 2014-04-29 MED ORDER — METOPROLOL TARTRATE 50 MG PO TABS
50.0000 mg | ORAL_TABLET | Freq: Two times a day (BID) | ORAL | Status: DC
  Administered 2014-04-29 – 2014-05-02 (×6): 50 mg via ORAL
  Filled 2014-04-29 (×2): qty 1
  Filled 2014-04-29: qty 2
  Filled 2014-04-29: qty 1
  Filled 2014-04-29: qty 2
  Filled 2014-04-29 (×5): qty 1

## 2014-04-29 MED ORDER — FUROSEMIDE 40 MG PO TABS
40.0000 mg | ORAL_TABLET | Freq: Every day | ORAL | Status: DC
  Administered 2014-04-30 – 2014-05-02 (×3): 40 mg via ORAL
  Filled 2014-04-29 (×4): qty 1

## 2014-04-29 MED ORDER — METOPROLOL TARTRATE 1 MG/ML IV SOLN
5.0000 mg | INTRAVENOUS | Status: DC | PRN
  Administered 2014-04-30: 5 mg via INTRAVENOUS
  Filled 2014-04-29: qty 5

## 2014-04-29 MED ORDER — SODIUM CHLORIDE 0.9 % IV SOLN
INTRAVENOUS | Status: AC
  Administered 2014-04-29: 20:00:00 via INTRAVENOUS

## 2014-04-29 MED ORDER — CYANOCOBALAMIN 500 MCG PO TABS
500.0000 ug | ORAL_TABLET | Freq: Every day | ORAL | Status: DC
  Administered 2014-04-29: 500 ug via ORAL
  Filled 2014-04-29: qty 1

## 2014-04-29 MED ORDER — INFLUENZA VAC SPLIT QUAD 0.5 ML IM SUSY
0.5000 mL | PREFILLED_SYRINGE | INTRAMUSCULAR | Status: AC
  Administered 2014-04-30: 0.5 mL via INTRAMUSCULAR
  Filled 2014-04-29: qty 0.5

## 2014-04-29 MED ORDER — LIDOCAINE HCL (PF) 1 % IJ SOLN
INTRAMUSCULAR | Status: AC
  Filled 2014-04-29: qty 30

## 2014-04-29 MED ORDER — METOPROLOL TARTRATE 1 MG/ML IV SOLN
INTRAVENOUS | Status: AC
  Filled 2014-04-29: qty 5

## 2014-04-29 MED ORDER — INSULIN ASPART 100 UNIT/ML ~~LOC~~ SOLN
0.0000 [IU] | Freq: Three times a day (TID) | SUBCUTANEOUS | Status: DC
  Administered 2014-04-30 (×2): 3 [IU] via SUBCUTANEOUS
  Administered 2014-05-01: 5 [IU] via SUBCUTANEOUS
  Administered 2014-05-01 – 2014-05-02 (×2): 2 [IU] via SUBCUTANEOUS
  Administered 2014-05-02: 11 [IU] via SUBCUTANEOUS

## 2014-04-29 MED ORDER — NITROGLYCERIN IN D5W 200-5 MCG/ML-% IV SOLN
10.0000 ug/min | INTRAVENOUS | Status: DC
  Administered 2014-04-29: 30 ug/min via INTRAVENOUS
  Administered 2014-04-29: 10 ug/min via INTRAVENOUS
  Filled 2014-04-29: qty 250

## 2014-04-29 MED ORDER — HEPARIN SODIUM (PORCINE) 5000 UNIT/ML IJ SOLN
5000.0000 [IU] | Freq: Three times a day (TID) | INTRAMUSCULAR | Status: DC
  Administered 2014-04-30 – 2014-05-02 (×7): 5000 [IU] via SUBCUTANEOUS
  Filled 2014-04-29 (×12): qty 1

## 2014-04-29 MED ORDER — MORPHINE SULFATE 10 MG/ML IJ SOLN
2.0000 mg | Freq: Once | INTRAMUSCULAR | Status: AC
  Administered 2014-04-29: 2 mg via INTRAVENOUS

## 2014-04-29 MED ORDER — ACETAMINOPHEN ER 650 MG PO TBCR: 1300.0000 mg | EXTENDED_RELEASE_TABLET | Freq: Every day | ORAL | Status: DC

## 2014-04-29 MED ORDER — NITROGLYCERIN 1 MG/10 ML FOR IR/CATH LAB
INTRA_ARTERIAL | Status: AC
  Filled 2014-04-29: qty 10

## 2014-04-29 MED ORDER — HEPARIN (PORCINE) IN NACL 2-0.9 UNIT/ML-% IJ SOLN
INTRAMUSCULAR | Status: AC
  Filled 2014-04-29: qty 1000

## 2014-04-29 MED ORDER — ADENOSINE 12 MG/4ML IV SOLN
12.0000 mL | Freq: Once | INTRAVENOUS | Status: DC
  Filled 2014-04-29: qty 12

## 2014-04-29 MED ORDER — METOPROLOL TARTRATE 1 MG/ML IV SOLN
2.5000 mg | INTRAVENOUS | Status: DC | PRN
  Administered 2014-04-29: 2.5 mg via INTRAVENOUS
  Filled 2014-04-29: qty 5

## 2014-04-29 MED ORDER — FENTANYL CITRATE 0.05 MG/ML IJ SOLN
INTRAMUSCULAR | Status: AC
  Filled 2014-04-29: qty 2

## 2014-04-29 MED ORDER — METOPROLOL TARTRATE 1 MG/ML IV SOLN
5.0000 mg | Freq: Once | INTRAVENOUS | Status: AC
  Administered 2014-04-29: 5 mg via INTRAVENOUS

## 2014-04-29 MED ORDER — ATORVASTATIN CALCIUM 20 MG PO TABS
20.0000 mg | ORAL_TABLET | Freq: Every day | ORAL | Status: DC
  Filled 2014-04-29: qty 1

## 2014-04-29 MED ORDER — MIDAZOLAM HCL 2 MG/2ML IJ SOLN
INTRAMUSCULAR | Status: AC
  Filled 2014-04-29: qty 2

## 2014-04-29 MED ORDER — ACETAMINOPHEN 325 MG PO TABS: 650.0000 mg | ORAL_TABLET | ORAL | Status: DC | PRN

## 2014-04-29 MED ORDER — MORPHINE SULFATE 2 MG/ML IJ SOLN
INTRAMUSCULAR | Status: AC
  Filled 2014-04-29: qty 1

## 2014-04-29 MED ORDER — ALPRAZOLAM 0.25 MG PO TABS
ORAL_TABLET | ORAL | Status: AC
  Filled 2014-04-29: qty 1

## 2014-04-29 MED ORDER — BIVALIRUDIN 250 MG IV SOLR
INTRAVENOUS | Status: AC
  Filled 2014-04-29: qty 250

## 2014-04-29 MED ORDER — ONDANSETRON HCL 4 MG/2ML IJ SOLN
4.0000 mg | Freq: Four times a day (QID) | INTRAMUSCULAR | Status: DC | PRN
  Administered 2014-04-29: 4 mg via INTRAVENOUS

## 2014-04-29 MED ORDER — NITROGLYCERIN IN D5W 200-5 MCG/ML-% IV SOLN
2.0000 ug/min | INTRAVENOUS | Status: DC
  Administered 2014-04-30: 50 ug/min via INTRAVENOUS
  Filled 2014-04-29: qty 250

## 2014-04-29 MED ORDER — HYDRALAZINE HCL 20 MG/ML IJ SOLN
INTRAMUSCULAR | Status: AC
  Filled 2014-04-29: qty 1

## 2014-04-29 MED ORDER — HEPARIN BOLUS VIA INFUSION
3000.0000 [IU] | Freq: Once | INTRAVENOUS | Status: AC
  Administered 2014-04-29: 3000 [IU] via INTRAVENOUS
  Filled 2014-04-29: qty 3000

## 2014-04-29 MED ORDER — FA-PYRIDOXINE-CYANOCOBALAMIN 2.5-25-2 MG PO TABS
1.0000 | ORAL_TABLET | Freq: Every day | ORAL | Status: DC
  Administered 2014-04-29 – 2014-05-02 (×4): 1 via ORAL
  Filled 2014-04-29 (×4): qty 1

## 2014-04-29 MED ORDER — SERTRALINE HCL 100 MG PO TABS
100.0000 mg | ORAL_TABLET | Freq: Every day | ORAL | Status: DC
  Administered 2014-04-29 – 2014-05-02 (×4): 100 mg via ORAL
  Filled 2014-04-29 (×4): qty 1

## 2014-04-29 MED ORDER — ACETAMINOPHEN 500 MG PO TABS
1000.0000 mg | ORAL_TABLET | Freq: Every day | ORAL | Status: DC
  Administered 2014-04-29 – 2014-05-01 (×3): 1000 mg via ORAL
  Filled 2014-04-29 (×5): qty 2

## 2014-04-29 MED ORDER — CLOPIDOGREL BISULFATE 75 MG PO TABS
75.0000 mg | ORAL_TABLET | Freq: Every day | ORAL | Status: DC
  Administered 2014-04-30 – 2014-05-02 (×3): 75 mg via ORAL
  Filled 2014-04-29 (×4): qty 1

## 2014-04-29 MED ORDER — ATORVASTATIN CALCIUM 80 MG PO TABS
80.0000 mg | ORAL_TABLET | Freq: Every day | ORAL | Status: DC
  Administered 2014-04-30 – 2014-05-01 (×2): 80 mg via ORAL
  Filled 2014-04-29 (×4): qty 1

## 2014-04-29 MED ORDER — CALCIUM CARBONATE 600 MG PO TABS
600.0000 mg | ORAL_TABLET | Freq: Three times a day (TID) | ORAL | Status: DC
  Filled 2014-04-29 (×5): qty 1

## 2014-04-29 MED ORDER — LISINOPRIL 5 MG PO TABS
5.0000 mg | ORAL_TABLET | Freq: Every day | ORAL | Status: DC
  Filled 2014-04-29: qty 1

## 2014-04-29 MED ORDER — OMEGA-3 FATTY ACIDS 1000 MG PO CAPS: 1.0000 g | ORAL_CAPSULE | Freq: Every day | ORAL | Status: DC

## 2014-04-29 MED ORDER — METOPROLOL TARTRATE 25 MG PO TABS
25.0000 mg | ORAL_TABLET | Freq: Two times a day (BID) | ORAL | Status: DC
  Administered 2014-04-29: 25 mg via ORAL
  Filled 2014-04-29: qty 1

## 2014-04-29 MED ORDER — ASPIRIN 81 MG PO CHEW
81.0000 mg | CHEWABLE_TABLET | Freq: Every day | ORAL | Status: DC
  Administered 2014-04-29 – 2014-05-02 (×4): 81 mg via ORAL
  Filled 2014-04-29 (×4): qty 1

## 2014-04-29 MED ORDER — PNEUMOCOCCAL VAC POLYVALENT 25 MCG/0.5ML IJ INJ
0.5000 mL | INJECTION | INTRAMUSCULAR | Status: AC
  Administered 2014-04-30: 0.5 mL via INTRAMUSCULAR
  Filled 2014-04-29: qty 0.5

## 2014-04-29 MED ORDER — SODIUM CHLORIDE 0.9 % IJ SOLN: 3.0000 mL | INTRAMUSCULAR | Status: DC | PRN

## 2014-04-29 MED ORDER — CALCIUM CARBONATE 1250 (500 CA) MG PO TABS
1.0000 | ORAL_TABLET | Freq: Three times a day (TID) | ORAL | Status: DC
  Filled 2014-04-29 (×5): qty 1

## 2014-04-29 MED ORDER — SODIUM CHLORIDE 0.9 % IV SOLN: 250.0000 mL | INTRAVENOUS | Status: DC | PRN

## 2014-04-29 MED ORDER — LISINOPRIL 5 MG PO TABS
5.0000 mg | ORAL_TABLET | Freq: Every day | ORAL | Status: DC
  Administered 2014-04-29 – 2014-05-02 (×4): 5 mg via ORAL
  Filled 2014-04-29 (×4): qty 1

## 2014-04-29 MED ORDER — OCUVITE PO TABS
1.0000 | ORAL_TABLET | Freq: Every day | ORAL | Status: DC
  Filled 2014-04-29: qty 1

## 2014-04-29 MED ORDER — HYDROCODONE-ACETAMINOPHEN 5-325 MG PO TABS: 1.0000 | ORAL_TABLET | Freq: Four times a day (QID) | ORAL | Status: DC | PRN

## 2014-04-29 MED ORDER — DOCUSATE SODIUM 100 MG PO CAPS: 100.0000 mg | ORAL_CAPSULE | Freq: Two times a day (BID) | ORAL | Status: DC

## 2014-04-29 MED ORDER — ALPRAZOLAM 0.25 MG PO TABS
0.2500 mg | ORAL_TABLET | Freq: Three times a day (TID) | ORAL | Status: DC | PRN
  Administered 2014-04-29 (×2): 0.25 mg via ORAL
  Filled 2014-04-29: qty 1

## 2014-04-29 MED ORDER — OMEGA-3-ACID ETHYL ESTERS 1 G PO CAPS
1.0000 g | ORAL_CAPSULE | Freq: Every day | ORAL | Status: DC
  Administered 2014-04-29 – 2014-05-02 (×4): 1 g via ORAL
  Filled 2014-04-29 (×4): qty 1

## 2014-04-29 MED ORDER — PANTOPRAZOLE SODIUM 40 MG PO TBEC
40.0000 mg | DELAYED_RELEASE_TABLET | Freq: Every day | ORAL | Status: DC
  Administered 2014-04-29 – 2014-05-02 (×4): 40 mg via ORAL
  Filled 2014-04-29 (×4): qty 1

## 2014-04-29 MED ORDER — MAGNESIUM CHLORIDE 64 MG PO TBEC
1.0000 | DELAYED_RELEASE_TABLET | Freq: Every day | ORAL | Status: DC
  Administered 2014-04-29: 64 mg via ORAL
  Filled 2014-04-29: qty 1

## 2014-04-29 MED ORDER — SODIUM CHLORIDE 0.9 % IV SOLN: 1.0000 mL/kg/h | INTRAVENOUS | Status: AC

## 2014-04-29 MED ORDER — FUROSEMIDE 40 MG PO TABS
40.0000 mg | ORAL_TABLET | Freq: Every day | ORAL | Status: DC
  Filled 2014-04-29: qty 1

## 2014-04-29 MED ORDER — TRAMADOL HCL 50 MG PO TABS: 50.0000 mg | ORAL_TABLET | Freq: Four times a day (QID) | ORAL | Status: DC | PRN

## 2014-04-29 MED ORDER — ONDANSETRON HCL 4 MG/2ML IJ SOLN
INTRAMUSCULAR | Status: AC
  Filled 2014-04-29: qty 2

## 2014-04-29 MED ORDER — ACETAMINOPHEN 325 MG PO TABS
650.0000 mg | ORAL_TABLET | ORAL | Status: DC | PRN
Start: 2014-04-29 — End: 2014-05-02

## 2014-04-29 MED ORDER — SODIUM CHLORIDE 0.9 % IJ SOLN
3.0000 mL | Freq: Two times a day (BID) | INTRAMUSCULAR | Status: DC
  Administered 2014-04-29 – 2014-05-01 (×5): 3 mL via INTRAVENOUS

## 2014-04-29 MED ORDER — HEPARIN (PORCINE) IN NACL 100-0.45 UNIT/ML-% IJ SOLN
700.0000 [IU]/h | INTRAMUSCULAR | Status: DC
  Administered 2014-04-29 (×2): 700 [IU]/h via INTRAVENOUS
  Filled 2014-04-29: qty 250

## 2014-04-29 MED ORDER — HYDRALAZINE HCL 20 MG/ML IJ SOLN
10.0000 mg | INTRAMUSCULAR | Status: DC | PRN
  Administered 2014-04-29 – 2014-04-30 (×3): 10 mg via INTRAVENOUS
  Filled 2014-04-29 (×2): qty 1

## 2014-04-29 MED ORDER — CLONIDINE HCL 0.1 MG PO TABS
0.1000 mg | ORAL_TABLET | Freq: Two times a day (BID) | ORAL | Status: DC
  Administered 2014-04-29 – 2014-05-01 (×5): 0.1 mg via ORAL
  Filled 2014-04-29 (×10): qty 1

## 2014-04-29 MED ORDER — CLOPIDOGREL BISULFATE 300 MG PO TABS
600.0000 mg | ORAL_TABLET | Freq: Once | ORAL | Status: AC
  Administered 2014-04-29: 600 mg via ORAL
  Filled 2014-04-29: qty 2

## 2014-04-29 MED ORDER — SODIUM CHLORIDE 0.9 % IV SOLN
10.0000 mL/h | INTRAVENOUS | Status: DC
  Administered 2014-04-29: 10 mL/h via INTRAVENOUS

## 2014-04-29 MED ORDER — INSULIN GLARGINE 100 UNIT/ML ~~LOC~~ SOLN
10.0000 [IU] | Freq: Every day | SUBCUTANEOUS | Status: DC
  Administered 2014-04-29 – 2014-04-30 (×2): 10 [IU] via SUBCUTANEOUS
  Filled 2014-04-29 (×4): qty 0.1

## 2014-04-29 MED ORDER — METOPROLOL TARTRATE 1 MG/ML IV SOLN
INTRAVENOUS | Status: AC
  Administered 2014-04-29: 5 mg via INTRAVENOUS
  Filled 2014-04-29: qty 5

## 2014-04-29 MED ORDER — DOCUSATE SODIUM 100 MG PO CAPS
200.0000 mg | ORAL_CAPSULE | Freq: Two times a day (BID) | ORAL | Status: DC
  Administered 2014-04-29 – 2014-05-02 (×6): 200 mg via ORAL
  Filled 2014-04-29 (×10): qty 2

## 2014-04-29 MED ORDER — LEVOTHYROXINE SODIUM 75 MCG PO TABS
75.0000 ug | ORAL_TABLET | Freq: Every day | ORAL | Status: DC
  Administered 2014-04-29 – 2014-05-02 (×4): 75 ug via ORAL
  Filled 2014-04-29 (×7): qty 1

## 2014-04-29 MED ORDER — MAGNESIUM 100 MG PO TABS: 100.0000 mg | ORAL_TABLET | Freq: Every day | ORAL | Status: DC

## 2014-04-29 MED ORDER — SODIUM CHLORIDE 0.9 % IJ SOLN
3.0000 mL | Freq: Two times a day (BID) | INTRAMUSCULAR | Status: DC
  Administered 2014-04-29: 3 mL via INTRAVENOUS

## 2014-04-29 NOTE — ED Notes (Addendum)
Pt brought to the ER by [facility] from home c/o CP and SOB for 3 days, pt states pressure is from mid chest to left arm. Pt denies n/v no changes on bladder or BM. 2 nitrostat taken by pt at home with relief and 324 mg ASA given by EMS. Pt is a home O2 dependent.

## 2014-04-29 NOTE — ED Notes (Signed)
Critical Troponin results given to Dr. Winfred Leeds

## 2014-04-29 NOTE — Progress Notes (Signed)
Chaplain paged for Code Stemi.  No family found.  Chaplain available if needed.

## 2014-04-29 NOTE — Progress Notes (Signed)
Inpatient Diabetes Program Recommendations  AACE/ADA: New Consensus Statement on Inpatient Glycemic Control (2013)  Target Ranges:  Prepandial:   less than 140 mg/dL      Peak postprandial:   less than 180 mg/dL (1-2 hours)      Critically ill patients:  140 - 180 mg/dL   Results for Dana Blackwell, Dana Blackwell (MRN [id number]) as of 04/29/2014 08:28  Ref. Range 04/29/2014 04:40  Glucose Latest Range: 70-99 mg/dL 205 (H)   Results for Dana Blackwell, Dana Blackwell (MRN [id number]) as of 04/29/2014 08:28  Ref. Range 04/29/2014 08:16  Glucose-Capillary Latest Range: 70-99 mg/dL 202 (H)   Diabetes history: DM Outpatient Diabetes medications: Latnus 10 units QHS, Humalog 4-5 units TID with meals Current orders for Inpatient glycemic control: Lantus 10 units QHS  Inpatient Diabetes Program Recommendations Correction (SSI): Please order CBGs with Novolog correction scale ACHS. HgbA1C: Last A1C was 6.8% on 11/28/13. Please order an A1C to evaluate glycemic control over the past 2-3 months.  Thanks, Barnie Alderman, RN, MSN, CCRN Diabetes Coordinator Inpatient Diabetes Program [phone] (Team Pager) [phone] (AP office) [phone] [facility] office)

## 2014-04-29 NOTE — Progress Notes (Signed)
Spoke with Dr. Hal Neer - he feels she is at acceptable risk for anticoagulation. Will load Plavix 600 mg.   Rosaria Ferries, PA-C 04/29/2014 10:00 AM Beeper [phone]

## 2014-04-29 NOTE — CV Procedure (Signed)
Cardiac Catheterization Procedure Note  Name: Dana Blackwell [facility] MRN: [id number] DOB: [birth year]  Procedure: Catheter placement for angiography, Selective Coronary Angiography, PTCA/Stent of the left distal left circumflex (dominant vessel)  Indication: Non-STEMI with ongoing ischemic symptoms. This 80 year old woman underwent PCI of the LAD this afternoon. She initially presented with non-ST elevation infarction. Following the procedure, she has continued to have chest discomfort and dynamic EKG changes. She has been treated with IV nitroglycerin, morphine, IV beta blockade, but has persisted with anginal chest pain radiating from the chest into her back. We elected to bring her back emergently for repeat angiography and possible PCI. She was noted to have severe stenosis in a dominant left circumflex, but this was unchanged from her study a few months back. I felt that her LAD was the culprit vessel and this was treated earlier today.   Diagnostic Procedure Details: The left groin was prepped, draped, and anesthetized with 1% lidocaine. Using the modified Seldinger technique, a 6 French sheath was introduced into the right femoral artery. A 6 French XB 3.0 cm guide catheter was used for coronary angiography. The diagnostic procedure was well-tolerated without immediate complications.  PROCEDURAL FINDINGS Hemodynamics: AO [phone] with a mean of 86   Coronary angiography: Coronary dominance: Left  Left mainstem: The left main is patent the distal left main as 30% stenosis  Left anterior descending (LAD): The ostium of the LAD has 60% stenosis before the stented segment. FFR was done earlier today and this area and the result was normal at 0.95. The stented segment in the LAD is patent throughout with no significant in-stent restenosis. The diagonal branch is diffusely diseased with 70% ostial stenosis.  Left circumflex (LCx): The left circumflex is a large, dominant vessel.  Proximally, the vessel is patent. The mid vessel is patent with mild nonobstructive disease. The first obtuse marginal branch has 70% ostial stenosis. The distal left circumflex a tight 90% stenosis leading into a left PDA branch.  Right coronary artery (RCA): Not selectively injected. Known to be a small, nondominant vessel.  Left ventriculography: Not performed  PCI Procedure Note:  Following the diagnostic procedure, the decision was made to proceed with PCI of the distal left circumflex. She has been preloaded with Plavix from her procedure earlier today. The LAD appeared unchanged and while the ostium of the vessel is diseased, the FFR from this afternoon and was entirely normal on the segment. As she had ongoing chest pain, the only treatable area appears to be the distal left circumflex where there is a focal 80-90% stenosis. Weight-based bivalirudin was given for anticoagulation.   A cougar coronary guidewire was used to cross the lesion.  The lesion was predilated with a 2.5 x 12 mm balloon.  The lesion was then stented with a 2.5 x 16 mm Promus DES stent.  The stent was postdilated with a 2.75 mm noncompliant balloon.  Following PCI, there was 0% residual stenosis and TIMI-3 flow. Final angiography confirmed an excellent result. Femoral hemostasis was achieved with a Perclose device.  The patient tolerated the PCI procedure well. There were no immediate procedural complications.  The patient was transferred to the post catheterization recovery area for further monitoring.  PCI Data: Vessel - left circumflex/Segment - distal Percent Stenosis (pre)  90 TIMI-flow 3 Stent 2.5 x 16 mm Promus DES Percent Stenosis (post) 0 TIMI-flow (post) 3  Contrast: 110 cc Omnipaque  Radiation dose/Fluoro time: 6.8 minutes  Estimated Blood Loss: Minimal  Final Conclusions:  1. Severe distal left circumflex stenosis treated successfully with PCI using a drug-eluting stent 2. Continued patency of the LAD  with moderate ostial stenosis and a widely patent stented segment  Recommendations: Will monitor in the CCU overnight. We'll cycle enzymes and I suspect she has had a significant non-ST elevation infarction. Continue aggressive medical therapy with IV nitroglycerin, beta blockade, and antiplatelet therapy.  Sherren Mocha MD, [facility] 04/29/2014, 7:41 PM

## 2014-04-29 NOTE — Progress Notes (Signed)
ANTICOAGULATION CONSULT NOTE - Initial Consult  Pharmacy Consult for Heparin Indication: chest pain/ACS  Allergies  Allergen Reactions  . Tetanus Toxoid Other (See Comments)    unknown    Patient Measurements: Height: 4\' 10"  (147.3 cm) Weight: 135 lb (61.236 kg) IBW/kg (Calculated) : 40.9 Heparin Dosing Weight: 55 kg  Vital Signs: Temp: 98.1 F (36.7 C) (09/09 0417) Temp src: Oral (09/09 0417) BP: 178/74 mmHg (09/09 0530) Pulse Rate: 82 (09/09 0530)  Labs:  Recent Labs  04/29/14 0440  HGB 10.7*  HCT 33.9*  PLT 274  CREATININE 1.33*    Estimated Creatinine Clearance: 24.4 ml/min (by C-G formula based on Cr of 1.33).   Medical History: Past Medical History  Diagnosis Date  . Coronary artery disease     11/2012: s/p 3.0 x 28 mm Xience and 2.5 x 28 mm Xience stents to the LAD    . Hypertension   . Hyperlipidemia   . Stroke     hx of  . Obesity   . Heart murmur   . Diabetes mellitus     type II  . Hypothyroidism   . Esophageal stricture   . Gastritis   . Nausea and vomiting   . Diarrhea   . Abdominal pain   . Esophageal reflux   . Depression   . Uterine cancer   . H/O: hysterectomy     Medications:  Xanax  ASA  Ocuvite  Oscal  Clonidine  Lovaza  Lasix  Lantus  Vicodin  Humalog  Imdur  Synthroid  Zestril  Magnesium  Protonix  Senokot-S  Zoloft  Ultram  Assessment: 80 yo female with chest pain, elevated cardiac markers, for heparin  Goal of Therapy:  Heparin level 0.3-0.7 units/ml Monitor platelets by anticoagulation protocol: Yes   Plan:  Heparin 3000 units IV bolus, then start heparin 700 units/hr Check heparin level in 8 hours.   Caryl Pina 04/29/2014,5:46 AM

## 2014-04-29 NOTE — CV Procedure (Signed)
    Cardiac Catheterization Procedure Note  Name: Dana Blackwell MRN: [id number] DOB: 1928/02/12  Procedure: Left Heart Cath, Selective Coronary Angiography,  FFR and PTCA of the LAD  Indication: NSTEMI  Diagnostic Procedure Details: The right groin was prepped, draped, and anesthetized with 1% lidocaine. Using the modified Seldinger technique, a 5 French sheath was introduced into the right femoral artery. Standard Judkins catheters were used for selective coronary angiography. Catheter exchanges were performed over a wire.  The diagnostic procedure was well-tolerated without immediate complications.  PROCEDURAL FINDINGS Hemodynamics: AO 144/62 LV 144/7  Coronary angiography: Coronary dominance: right  Left mainstem: Patent without obstructive disease  Left anterior descending (LAD): Heavily stented. The ostium of the LAD has 50-60% stenosis just before the stented segment. The proximal stents are widely patent. The mid-LAD stents have 50% stenosis followed by a focal 90% stenosis. The distal vessel is patent.  Left circumflex (LCx): Dominant vessel. The first OM has 75% proximal stenosis. This is unchanged from the previous study. The distal AV circumflex into the left PDA has 80-90% stenosis unchanged from previous.   Right coronary artery (RCA): Heavy Ca++ at the origin. Small, nondominant vessel without significant disease. Poorly visualized on today's study.  Left ventriculography: deferred  PCI Procedure Note:  Following the diagnostic procedure, the decision was made to proceed with PCI. The sheath was upsized to a 6 [place]. Weight-based bivalirudin was given for anticoagulation. The patient had received plavix 600 mg orally. Once a therapeutic ACT was achieved, a 6 [place] XB-LAD 3.0 cm guide catheter was inserted.  A cougar coronary guidewire was used to cross the lesion.  I performed FFR to evaluate the physiologic significance of the ostial LAD stenosis as well as the  mid vessel severe stenosis. IV adenosine was used. The baseline FFR proximally was 0.95 and distally was 0.82. With IV adenosine, the distal FFR was 0.73 and proximally was 0.92. The mid-lesion was predilated with a 2.5 mm Flextome cutting balloon.  The lesion was then redilated with 2.75 mm Dixon balloon to 16 atm.  Following PCI, there was 10% residual stenosis and TIMI-3 flow. Final angiography confirmed an excellent result. Femoral hemostasis was achieved with a Perclose device.  The patient tolerated the PCI procedure well. There were no immediate procedural complications.  The patient was transferred to the post catheterization recovery area for further monitoring.  PCI Data: Vessel - LAD/Segment - mid (in-stent) Percent Stenosis (pre)  90 TIMI-flow 3 Stent none (2.5 mm cutting balloon and 2.75 mm Alpha balloon) Percent Stenosis (post) 10 TIMI-flow (post) 3  Contrast: 115  Radiation dose/Fluoro time: 10.7 min  Estimated Blood Loss: minimal  Final Conclusions:   Multivessel CAD with stable moderate-severe disease in the LCx and severe in-stent restenosis in the LAD. Lad treated successful with PTCA using FFR-guidance.  Recommendations: continue aggressive medical therapy. Situation complicated by previous subarachnoid hemorrhage. Will place her back on ASA 81 mg and plavix 75 mg. Case discussed earlier today with neurosurgery, Dr Hal Neer.   Sherren Mocha MD, [facility] 04/29/2014, 3:30 PM

## 2014-04-29 NOTE — ED Provider Notes (Signed)
CSN: [id number]     Arrival date & time 04/29/14  0407 History   First MD Initiated Contact with Patient 04/29/14 0503     Chief Complaint  Patient presents with  . Chest Pain  . Shortness of Breath     (Consider location/radiation/quality/duration/timing/severity/associated sxs/prior Treatment) HPI Complained of anterior chest pain onset upon awakening 3 AM today. Pain worsened when she walked to go to the bathroom. She also reports that she has needed to treat herself with subungual nitroglycerin the past 2 consecutive days for chest pain. EMS treated patient with sublingual nitroglycerin and 4 baby aspirin's with relief of pain. She is presently asymptomatic. Associated symptoms included shortness of breath no nausea no sweatiness. Pain is worse with walking and improved with rest Past Medical History  Diagnosis Date  . Coronary artery disease     11/2012: s/p 3.0 x 28 mm Xience and 2.5 x 28 mm Xience stents to the LAD    . Hypertension   . Hyperlipidemia   . Stroke     hx of  . Obesity   . Heart murmur   . Diabetes mellitus     type II  . Hypothyroidism   . Esophageal stricture   . Gastritis   . Nausea and vomiting   . Diarrhea   . Abdominal pain   . Esophageal reflux   . Depression   . Uterine cancer   . H/O: hysterectomy    Past Surgical History  Procedure Laterality Date  . Bladder surgery      tacking  . Cervical disc surgery    . Cholecystectomy    . Total abdominal hysterectomy     Family History  Problem Relation Age of Onset  . Heart attack Mother 34    died  . Coronary artery disease      family hx on  father side  . Heart attack Brother   . Heart attack Sister    History  Substance Use Topics  . Smoking status: Never Smoker   . Smokeless tobacco: Never Used  . Alcohol Use: No   OB History   Grav Para Term Preterm Abortions TAB SAB Ect Mult Living                 Review of Systems  Respiratory: Positive for shortness of breath.    Cardiovascular: Positive for chest pain.  Gastrointestinal: Positive for constipation.       Chronic constipation  Musculoskeletal: Positive for back pain.       Chronic back pain secondary to arthritis  All other systems reviewed and are negative.     Allergies  Tetanus toxoid  Home Medications   Prior to Admission medications   Medication Sig Start Date End Date Taking? Authorizing Provider  acetaminophen (TYLENOL) 650 MG CR tablet Take 650 mg by mouth daily.     Historical Provider, MD  ALPRAZolam Duanne Moron) 0.25 MG tablet Take 1 tablet (0.25 mg total) by mouth 3 (three) times daily as needed for anxiety. 02/27/14   Sheila Oats, MD  aspirin 81 MG tablet Take 81 mg by mouth daily.    Historical Provider, MD  atorvastatin (LIPITOR) 20 MG tablet Take 20 mg by mouth daily at 6 PM.     Historical Provider, MD  beta carotene w/minerals (OCUVITE) tablet Take 1 tablet by mouth at bedtime.     Historical Provider, MD  bisacodyl (DULCOLAX) 5 MG EC tablet Take 2 tablets (10 mg total) by mouth daily  as needed for moderate constipation. 02/27/14   Sheila Oats, MD  calcium carbonate (OS-CAL) 600 MG TABS tablet Take 600 mg by mouth daily.    Historical Provider, MD  cloNIDine (CATAPRES) 0.1 MG tablet Take 1 tablet (0.1 mg total) by mouth 2 (two) times daily. 02/27/14   Sheila Oats, MD  Cyanocobalamin (VITAMIN B-12 PO) Take 1 tablet by mouth daily.    Historical Provider, MD  fish oil-omega-3 fatty acids 1000 MG capsule Take 1 g by mouth daily.     Historical Provider, MD  folic acid-pyridoxine-cyancobalamin (FOLTX) 2.5-25-2 MG TABS Take 1 tablet by mouth daily.     Historical Provider, MD  furosemide (LASIX) 40 MG tablet Take 1 tablet (40 mg total) by mouth daily. 03/27/14   Sherren Mocha, MD  HYDROcodone-acetaminophen (NORCO/VICODIN) 5-325 MG per tablet Take 1 tablet by mouth every 6 (six) hours as needed for moderate pain. 02/27/14   Sheila Oats, MD  insulin glargine (LANTUS) 100  UNIT/ML injection Inject 0.1 mLs (10 Units total) into the skin at bedtime. 11/28/13   Rhonda G Barrett, PA-C  insulin lispro (HUMALOG) 100 UNIT/ML injection Inject 4-5 Units into the skin 3 (three) times daily before meals.    Historical Provider, MD  isosorbide mononitrate (IMDUR) 60 MG 24 hr tablet Take 1 tablet (60 mg total) by mouth daily. 04/22/14   Liliane Shi, PA-C  levothyroxine (SYNTHROID, LEVOTHROID) 75 MCG tablet Take 75 mcg by mouth daily.      Historical Provider, MD  lisinopril (PRINIVIL,ZESTRIL) 5 MG tablet Take 1 tablet (5 mg total) by mouth daily. 11/28/13   Rhonda G Barrett, PA-C  MAGNESIUM PO Take 1 tablet by mouth at bedtime.     Historical Provider, MD  Misc. Devices (HEART RATE MONITOR) MISC 1 Device by Does not apply route as directed. Wear continuously for 30 days 02/27/14   Lyda Jester, PA-C  pantoprazole (PROTONIX) 40 MG tablet Take 40 mg by mouth daily.      Historical Provider, MD  senna-docusate (SENOKOT-S) 8.6-50 MG per tablet Take 1 tablet by mouth daily. 02/27/14   Sheila Oats, MD  sertraline (ZOLOFT) 100 MG tablet Take 50 mg by mouth daily.     Historical Provider, MD  traMADol (ULTRAM) 50 MG tablet Take 1-2 tablets (50-100 mg total) by mouth every 6 (six) hours as needed (50mg  for mild pain, 75mg  for moderate pain, 100mg  for severe pain). 02/27/14   Sheila Oats, MD   BP 169/71  Pulse 78  Temp(Src) 98.1 F (36.7 C) (Oral)  Resp 22  Ht 4\' 10"  (1.473 m)  Wt 135 lb (61.236 kg)  BMI 28.22 kg/m2  SpO2 100% Physical Exam  Nursing note and vitals reviewed. Constitutional: She appears well-developed and well-nourished.  HENT:  Head: Normocephalic and atraumatic.  Eyes: Conjunctivae are normal. Pupils are equal, round, and reactive to light.  Neck: Neck supple. No tracheal deviation present. No thyromegaly present.  Cardiovascular: Normal rate and regular rhythm.   No murmur heard. Pulmonary/Chest: Effort normal and breath sounds normal.  Abdominal:  Soft. Bowel sounds are normal. She exhibits no distension. There is no tenderness.  Musculoskeletal: Normal range of motion. She exhibits no edema and no tenderness.  Neurological: She is alert. Coordination normal.  Skin: Skin is warm and dry. No rash noted.  Psychiatric: She has a normal mood and affect.    ED Course  Procedures (including critical care time) Labs Review Labs Reviewed  CBC - Abnormal; Notable  for the following:    Hemoglobin 10.7 (*)    HCT 33.9 (*)    All other components within normal limits  I-STAT TROPOININ, ED - Abnormal; Notable for the following:    Troponin i, poc 0.48 (*)    All other components within normal limits  BASIC METABOLIC PANEL    Imaging Review Dg Chest Port 1 View  04/29/2014   CLINICAL DATA:  Chest pain and shortness of breath.  EXAM: PORTABLE CHEST - 1 VIEW  COMPARISON:  02/19/2014  FINDINGS: Shallow inspiration. Heart size and pulmonary vascularity are normal, demonstrating some improvement since previous study. Linear atelectasis in the right lung base. No focal consolidation. No blunting of costophrenic angles. No pneumothorax. Degenerative changes in the spine and shoulders.  IMPRESSION: Shallow inspiration with atelectasis in the right lung base.   Electronically Signed   By: Lucienne Capers M.D.   On: 04/29/2014 04:36     EKG Interpretation   Date/Time:  Wednesday April 29 2014 04:12:31 EDT Ventricular Rate:  104 PR Interval:  151 QRS Duration: 95 QT Interval:  377 QTC Calculation: 496 R Axis:   -25 Text Interpretation:  Sinus tachycardia Borderline left axis deviation  Borderline repolarization abnormality Borderline prolonged QT interval  SINCE LAST TRACING HEART RATE HAS INCREASED Confirmed by Winfred Leeds  MD,  Vandell Kun [phone]) on 04/29/2014 4:20:22 AM     Chest x-ray viewed by me Results for orders placed during the hospital encounter of 04/29/14  CBC      Result Value Ref Range   WBC 9.2  4.0 - 10.5 K/uL   RBC 3.93  3.87  - 5.11 MIL/uL   Hemoglobin 10.7 (*) 12.0 - 15.0 g/dL   HCT 33.9 (*) 36.0 - 46.0 %   MCV 86.3  78.0 - 100.0 fL   MCH 27.2  26.0 - 34.0 pg   MCHC 31.6  30.0 - 36.0 g/dL   RDW 15.4  11.5 - 15.5 %   Platelets 274  150 - 400 K/uL  BASIC METABOLIC PANEL      Result Value Ref Range   Sodium 142  137 - 147 mEq/L   Potassium 4.5  3.7 - 5.3 mEq/L   Chloride 102  96 - 112 mEq/L   CO2 30  19 - 32 mEq/L   Glucose, Bld 205 (*) 70 - 99 mg/dL   BUN 22  6 - 23 mg/dL   Creatinine, Ser 1.33 (*) 0.50 - 1.10 mg/dL   Calcium 9.1  8.4 - 10.5 mg/dL   GFR calc non Af Amer 36 (*) >90 mL/min   GFR calc Af Amer 41 (*) >90 mL/min   Anion gap 10  5 - 15  I-STAT TROPOININ, ED      Result Value Ref Range   Troponin i, poc 0.48 (*) 0.00 - 0.08 ng/mL   Comment NOTIFIED PHYSICIAN     Comment 3            Dg Chest Port 1 View  04/29/2014   CLINICAL DATA:  Chest pain and shortness of breath.  EXAM: PORTABLE CHEST - 1 VIEW  COMPARISON:  02/19/2014  FINDINGS: Shallow inspiration. Heart size and pulmonary vascularity are normal, demonstrating some improvement since previous study. Linear atelectasis in the right lung base. No focal consolidation. No blunting of costophrenic angles. No pneumothorax. Degenerative changes in the spine and shoulders.  IMPRESSION: Shallow inspiration with atelectasis in the right lung base.   Electronically Signed   By: Oren Beckmann.D.  On: 04/29/2014 04:36    MDM  Spoke with Dr. Marigene Ehlers plan admit step down unit. Intravenous heparin and nitroglycerin Final diagnoses:  None  Dx #1 NSTEMI #2 hyperglycemia #3anemia      Orlie Dakin, MD 04/29/14 [phone]

## 2014-04-29 NOTE — ED Notes (Signed)
Report attempted 

## 2014-04-29 NOTE — Progress Notes (Signed)
Screaming with 10/10 chest pain. Nauseated and trying to spit. BP continues to be elevated. 12 lead EKG done and shown to [place]. Orders received; see MAR.

## 2014-04-29 NOTE — Progress Notes (Signed)
Continues to have 8/10 chest pain, shoulder pain. Orders received per Dr. Burt Knack; see MAR 12-lead EKG done. Dr. Burt Knack at bedside. Awaiting emergency call team to arrive to take back to cath lab. Sleeping @ intervals then awakens with continued pain.

## 2014-04-29 NOTE — Progress Notes (Signed)
Pt with ongoing chest pain, lateral ST depression on EKG after PCI. She has been treated aggressively with IV NTG, IV metoprolol, morphine. With ongoing symptoms, will take back urgently for repeat cath to evaluate for abrupt closure.  Sherren Mocha 04/29/2014 6:36 PM

## 2014-04-29 NOTE — H&P (Signed)
History and Physical   Patient ID: Myya Meenach MRN: [id number], DOB/AGE: 78-Aug-1929 78 y.o. Date of Encounter: 04/29/2014  Primary Physician: [name], MD Primary Cardiologist: Dr. Talbot Grumbling  Chief Complaint:  Chest Pain and Shortness of Breath  HPI: Sreeja Spies is a 80 y.o. female with hx of CAD s/p DES x2 to the LAD in 11/2013, HTN, HLD,  DM type II, obesity, GERD.   Previous cath 2005 showed moderate three-vessel coronary artery disease for which medical therapy was recommended. She was admitted in 11/2013 for a NSTEMI. She underwent orbital atherectomy and stenting of the proximal to mid LAD with DES x 2 to the LAD. She was placed on DAPT, Plavix and ASA.   Care was complicated by a fall and subsequent SAH in Julyl 2015 for which she had to stop all antiplatelets therapy including ASA and Plavix until cleared per neurosurgery. Was placed back on low dose ASA on 04/17/2014 by Dr. Burt Knack.  Patient began to have anterior chest pain early am 3 days ago. The pain would resolve with nitrates and pain meds. Today, at about 3 AM, she woke with mild chest pain that worsened with minor ambulation to a 10/10. Pressure, all across her chest with radiation to her back under her left shoulder blade. Associated with shortness of breath. Denies nausea or diaphoresis. Took SL nitroglycerin x 2 at home, and was given 4 by EMS plus 324 mg ASA. She  was asymptomatic upon arrival to the ED. Was started on heparin in the ED.   Past Medical History  Diagnosis Date  . Coronary artery disease     11/2012: s/p 3.0 x 28 mm Xience and 2.5 x 28 mm Xience stents to the LAD    . Hypertension   . Hyperlipidemia   . Stroke     hx of  . Obesity   . Heart murmur   . Diabetes mellitus     type II  . Hypothyroidism   . Esophageal stricture   . Gastritis   . Nausea and vomiting   . Diarrhea   . Abdominal pain   . Esophageal reflux   . Depression   . Uterine cancer   . H/O:  hysterectomy     Surgical History:  Past Surgical History  Procedure Laterality Date  . Bladder surgery      tacking  . Cervical disc surgery    . Cholecystectomy    . Total abdominal hysterectomy       I have reviewed the patient's current medications. Prior to Admission medications   Medication Sig Start Date End Date Taking? Authorizing Provider  acetaminophen (TYLENOL) 650 MG CR tablet Take 1,300 mg by mouth at bedtime.    Yes Historical Provider, MD  ALPRAZolam (XANAX) 0.25 MG tablet Take 1 tablet (0.25 mg total) by mouth 3 (three) times daily as needed for anxiety. 02/27/14  Yes Adeline Saralyn Pilar, MD  aspirin 81 MG tablet Take 81 mg by mouth daily.   Yes Historical Provider, MD  atorvastatin (LIPITOR) 20 MG tablet Take 20 mg by mouth daily at 6 PM.    Yes Historical Provider, MD  beta carotene w/minerals (OCUVITE) tablet Take 1 tablet by mouth at bedtime.    Yes Historical Provider, MD  calcium carbonate (OS-CAL) 600 MG TABS tablet Take 600 mg by mouth daily.   Yes Historical Provider, MD  cloNIDine (CATAPRES) 0.1 MG tablet Take 1 tablet (0.1 mg total) by mouth 2 (  two) times daily. 02/27/14  Yes Adeline Saralyn Pilar, MD  Cyanocobalamin (VITAMIN B-12 PO) Take 1 tablet by mouth daily.   Yes Historical Provider, MD  Docusate Calcium (STOOL SOFTENER PO) Take 1 tablet by mouth 2 (two) times daily as needed (constipation).   Yes Historical Provider, MD  fish oil-omega-3 fatty acids 1000 MG capsule Take 1 g by mouth daily.    Yes Historical Provider, MD  folic acid-pyridoxine-cyancobalamin (FOLTX) 2.5-25-2 MG TABS Take 1 tablet by mouth daily.    Yes Historical Provider, MD  furosemide (LASIX) 40 MG tablet Take 1 tablet (40 mg total) by mouth daily. 03/27/14  Yes Sherren Mocha, MD  HYDROcodone-acetaminophen (NORCO/VICODIN) 5-325 MG per tablet Take 1 tablet by mouth every 6 (six) hours as needed for moderate pain. 02/27/14  Yes Adeline C Viyuoh, MD  insulin glargine (LANTUS) 100 UNIT/ML injection  Inject 0.1 mLs (10 Units total) into the skin at bedtime. 11/28/13  Yes Rhonda G Barrett, PA-C  insulin lispro (HUMALOG) 100 UNIT/ML injection Inject 4-5 Units into the skin 3 (three) times daily before meals.   Yes Historical Provider, MD  isosorbide mononitrate (IMDUR) 60 MG 24 hr tablet Take 1 tablet (60 mg total) by mouth daily. 04/22/14  Yes Scott Joylene Draft, PA-C  levothyroxine (SYNTHROID, LEVOTHROID) 75 MCG tablet Take 75 mcg by mouth daily.     Yes Historical Provider, MD  lisinopril (PRINIVIL,ZESTRIL) 5 MG tablet Take 1 tablet (5 mg total) by mouth daily. 11/28/13  Yes Rhonda G Barrett, PA-C  MAGNESIUM PO Take 1 tablet by mouth at bedtime.    Yes Historical Provider, MD  pantoprazole (PROTONIX) 40 MG tablet Take 40 mg by mouth daily.     Yes Historical Provider, MD  sertraline (ZOLOFT) 100 MG tablet Take 100 mg by mouth daily.    Yes Historical Provider, MD  traMADol (ULTRAM) 50 MG tablet Take 1-2 tablets (50-100 mg total) by mouth every 6 (six) hours as needed (50mg  for mild pain, 75mg  for moderate pain, 100mg  for severe pain). 02/27/14  Yes Adeline Saralyn Pilar, MD   Scheduled Meds: . acetaminophen  1,000 mg Oral QHS  . aspirin  81 mg Oral Daily  . atorvastatin  20 mg Oral q1800  . beta carotene w/minerals  1 tablet Oral QHS  . calcium carbonate  1 tablet Oral TID AC, HS, 0200  . cloNIDine  0.1 mg Oral BID  . vitamin B-12  500 mcg Oral Daily  . docusate sodium  200 mg Oral BID  . folic acid-pyridoxine-cyancobalamin  1 tablet Oral Daily  . furosemide  40 mg Oral Daily  . insulin glargine  10 Units Subcutaneous QHS  . levothyroxine  75 mcg Oral QAC breakfast  . lisinopril  5 mg Oral Daily  . magnesium chloride  1 tablet Oral Daily  . omega-3 acid ethyl esters  1 g Oral Daily  . pantoprazole  40 mg Oral Daily  . sertraline  100 mg Oral Daily   Continuous Infusions: . sodium chloride    . heparin 700 Units/hr (04/29/14 0745)  . nitroGLYCERIN 30 mcg/min (04/29/14 0745)   PRN  Meds:.ALPRAZolam, hydrALAZINE, HYDROcodone-acetaminophen, metoprolol, traMADol  Allergies:  Allergies  Allergen Reactions  . Tetanus Toxoid Other (See Comments)    unknown    History   Social History  . Marital Status: Divorced    Spouse Name: N/A    Number of Children: N/A  . Years of Education: N/A   Occupational History  . Retired  Social History Main Topics  . Smoking status: Never Smoker   . Smokeless tobacco: Never Used  . Alcohol Use: No  . Drug Use: No  . Sexual Activity: Not on file   Other Topics Concern  . Not on file   Social History Narrative   Lives alone.    Family History  Problem Relation Age of Onset  . Heart attack Mother 67    died  . Coronary artery disease      family hx on  father side  . Heart attack Brother   . Heart attack Sister    Family Status  Relation Status Death Age  . Mother Deceased   . Father Deceased     Review of Systems:   Full 14-point review of systems otherwise negative except as noted above.  Physical Exam: Blood pressure 159/86, pulse 81, temperature 98.1 F (36.7 C), temperature source Oral, resp. rate 14, height 4\' 10"  (1.473 m), weight 133 lb 13.1 oz (60.7 kg), SpO2 100.00%. General: Well developed, well nourished,female in no acute distress. Head: Normocephalic, atraumatic, sclera non-icteric, no xanthomas, nares are without discharge. Dentition: poor Neck: No carotid bruits. JVD not elevated. No thyromegally Lungs: Good expansion bilaterally. without wheezes or rhonchi.  Heart: Regular rate and rhythm with S1 S2.  No S3 or S4.  No murmur, no rubs, or gallops appreciated. Abdomen: Soft, non-tender, non-distended with normoactive bowel sounds. No hepatomegaly. No rebound/guarding. No obvious abdominal masses. Msk:  Strength and tone appear normal for age. No joint deformities or effusions, no spine or costo-vertebral angle tenderness. Extremities: No clubbing or cyanosis. No edema.  Distal pedal pulses are 2+  in 4 extrem Neuro: Alert and oriented X 3. Moves all extremities spontaneously. No focal deficits noted. Psych:  Responds to questions appropriately with a normal affect. Skin: No rashes or lesions noted  Labs:   Lab Results  Component Value Date   WBC 9.2 04/29/2014   HGB 10.7* 04/29/2014   HCT 33.9* 04/29/2014   MCV 86.3 04/29/2014   PLT 274 04/29/2014     Recent Labs Lab 04/29/14 0440  NA 142  K 4.5  CL 102  CO2 30  BUN 22  CREATININE 1.33*  CALCIUM 9.1  GLUCOSE 205*    Recent Labs  04/29/14 0445  TROPIPOC 0.48*   Radiology/Studies: Dg Chest Port 1 View 04/29/2014   CLINICAL DATA:  Chest pain and shortness of breath.  EXAM: PORTABLE CHEST - 1 VIEW  COMPARISON:  02/19/2014  FINDINGS: Shallow inspiration. Heart size and pulmonary vascularity are normal, demonstrating some improvement since previous study. Linear atelectasis in the right lung base. No focal consolidation. No blunting of costophrenic angles. No pneumothorax. Degenerative changes in the spine and shoulders.  IMPRESSION: Shallow inspiration with atelectasis in the right lung base.   Electronically Signed   By: Lucienne Capers M.D.   On: 04/29/2014 04:36    Cardiac Cath: Diagnostic Cath 11/24/2013 HEMODYNAMICS:  Central Aorta: 170/78  Left Ventricle: 170/14  ANGIOGRAPHY:  Fluoroscopy reveals extensive mitral annular calcification as well as moderate diffuse coronary calcification.  1. Left main: Large vessel which trifurcated into the LAD, a small ramus intermediate vessel, a dominant left circumflex coronary artery.  2. LAD: Very calcified proximal segment with 80% proximal stenosis before the first diagonal vessel. It was diffuse disease of 50% after the diagonal.  3. Ramus Intermediate: 95% ostial stenosis in a small vessel.  4. Left circumflex: Large dominant vessel that has 70% proximal stenosis and a  moderate size first marginal branch. There was 30-40% a degree of stenosis after the first marginal takeoff. There  was an 80% distal circumflex stenosis of the PLA vessel after several small LV branches  5. Right coronary artery: Ostial calcification with 70% ostial narrowing and 80% diffuse proximal stenosis in the small nondominant right coronary artery  Left ventriculography revealed normal LV function with an ejection fraction of 60%. There was extensive mitral annular calcification. There was left ventricular hypertrophy.  IMPRESSION:  Significant diffuse coronary calcification with three-vessel coronary obstructive disease and 80% proximal LAD stenosis before the first diagonal vessel with diffuse 50% mid LAD narrowing; 95% ostial stenosis of the diminutive ramus intermediate vessel; large dominant right coronary artery with 70% proximal OM1 stenosis, 30-40% AV groove stenosis after the marginal vessel and distal 80% stenosis involving the proximal portion of the PLA branch; ostial calcification with 70% ostial narrowing followed by diffuse 80% proximal right coronary artery narrowing.  RECOMMENDATION:  Angiographic findings will be reviewed with the patient's primary cardiologist, Dr. Burt Knack. The patient does have significant coronary artery calcification with high grade very proximal LAD stenosis. The patient will be hydrated. If percutaneous coronary intervention is pursued, high-speed rotational atherectomy may be necessary due to the significant coronary calcification. PCI: Indication: 80 yo WF presented with a NSTEMI. Diagnostic cardiac cath demonstrated a severe stenosis in the proximal LAD. The vessel was heavily calcified. There was tortuousity in the mid vessel. She has a left dominant system.  Procedural Details: The right groin was prepped, draped, and anesthetized with 1% lidocaine. Using the modified Seldinger technique, a 7 Fr sheath was introduced into the right femoral artery. A 6 Fr sheath was placed in the right femoral vein. A temporary transvenous pacing wire was advanced into the right  ventricular apex under fluoro guidance. Weight-based heparin was given for anticoagulation. Once a therapeutic ACT was achieved, a 7 [place] XBLAD 3.5 guide catheter was inserted. A prowater coronary guidewire was used to cross the lesion. This crossed the lesion but was unable to cross the tortuous segment in the mid vessel. We used a 1.5 OTW balloon and exchanged for a Whisper wire. This was passed into the distal LAD. We then used the OTW balloon to exchange for a Viper wire.The lesion was then treated with Orbital atherectomy using a Diamondback 1.25 mm Classic. A total of 4 runs were made in the proximal LAD at 80,000 rpm. After atherectomy angiography demonstrated a dissection in the mid vessel at the site of tortuosity. There was poor TIMI 1 flow distally. This was clearly distal to the segment treated with atherectomy and I felt this was related to crossing this area with the wire and balloon. She was given multiple doses of IC Ntg to alleviate spasm. The OTW balloon was used to exchange for a prowater wire to eliminate wire bias but flow was still reduced and there was clearly an area of dissection in the mid vessel. The mid vessel was then stented with a 2.5 x 28 mm Xience stent. The proximal vessel was then stented with a 3.0 x 28 mm Xience stent in an overlapping fashion. The stents were postdilated with a 3.0 noncompliant balloon. Following PCI, there was 0% residual stenosis and TIMI-3 flow. Final angiography confirmed an excellent result. The patient tolerated the procedure well. She did continue to have modest chest pain but was hemodynamically stable and ST changes had returned to baseline on the monitor. There were no immediate procedural complications. Femoral hemostasis was  achieved with manual compression. The patient was transferred to the post catheterization recovery area for further monitoring.  Lesion Data:  Vessel: LAD  Percent stenosis (pre): 95%  TIMI-flow (pre): 3  Stent: 3.0 x 28  mm Xience and 2.5 x 28 mm Xience  Percent stenosis (post): 0%  TIMI-flow (post): 3  Conclusions: Successful orbital atherectomy and stenting of the proximal to mid LAD with DES.  Recommendations: Continue dual antiplatelet therapy for at least one year. Given patients frail condition I would not anticipate DC tomorrow.    Echo 7/5/215: Study Conclusions - Left ventricle: The cavity size was normal. Systolic function was normal. The estimated ejection fraction was in the range of 60% to 65%. Wall motion was normal; there were no regional wall motion abnormalities. Features are consistent with a pseudonormal left ventricular filling pattern, with concomitant abnormal relaxation and increased filling pressure (grade 2 diastolic dysfunction). - Mitral valve: Moderately calcified annulus. Mildly thickened leaflets . - Left atrium: The atrium was mildly dilated. - Right ventricle: The cavity size was mildly dilated. Wall thickness was normal. - Right atrium: The atrium was mildly dilated. - Pulmonary arteries: Systolic pressure was severely increased. PA peak pressure: 75 mm Hg (S).  ECG: sinus tachycardia with nonspecific lateral slight ST depression.  ASSESSMENT AND PLAN:  Principal Problem:   NSTEMI (non-ST elevated myocardial infarction) - Cycle enzymes, continue ASA, heparin, nitrates. Contact NS regarding risk of rebleed before loading Plavix. Continue med rx, Cr up a little, but has D-CHF, so will not hydrate. Hold Lasix for now.   Active Problems:   Anemia - chronic, MCV OK, but will ck iron profile, and guaiac stools    Type I (juvenile type) diabetes mellitus with peripheral circulatory disorders, not stated as uncontrolled(250.71) - home rx plus SSI    Hypertension - add hydralazine and BB IV prn, hold ACE, diuretic for cath, may need oral hydralazine. Pauses on Toprol XL 100 mg and was D/C'd. Consider Coreg.    Hyperlipidemia - ck profile, increase Lipitor 20 mg --> 80 mg  for NSTEMI   SignedRosaria Ferries, PA-C 04/29/2014 8:24 AM Beeper [phone]  Patient seen with PA, agree with the above note.   1. NSTEMI: Patient has woken up with chest pain for 3 days in a row.  This morning, woke at 3 am with severe substernal chest pain that resolved after multiple NTG doses by EMS.  She is pain-free currently.  In ER, nonspecific changes on ECG with TnI 0.48.  She had DES x 2 to LAD in 4/15 and had diffuse residual disease.  She is on ASA but had been off Plavix due to traumatic SAH in 7/15.   - Continue ASA 81 and also on heparin gtt.  - Atorvastatin 80 mg daily.  - Hold off on beta blocker for now with history of sinus pauses.  - Will contact neurosurgery this morning to clear use of Plavix.  Suspect this will be ok.  Will then load Plavix 600 mg x 1 prior to [facility].  - LHC later today, keep NPO.  2. Chronic diastolic CHF: EF 29-52% on last echo with severe pulmonary hypertension by echo estimation.  She is on Lasix at home.  She has chronic exertional dyspnea and uses home oxygen at times.  For now, will hold Lasix given CKD and need for angiography.  Will need to restart diuretics post-cath.  3.  CKD: Creatinine 1.33.  Hold Lasix and ACEI prior to cath.  Would probably  hold off on ACEI in the future with preserved EF and elevated creatinine.   4. HTN: Control with NTG gtt for now, can transition to amlodipine +/- hydralazine.  5. History of sinus pauses: Holding off on beta blockade for now.  6. Traumatic SAH in 7/15: PA Barrett to contact neurosurgery prior to Plavix load.  From Dr Sande Rives last note in 7/15, suspect this should be ok (recommended remaining off all anticoagulation for "a few weeks").   7. Type I diabetes: Continue insulin.   Loralie Champagne 04/29/2014 8:38 AM

## 2014-04-29 NOTE — Interval H&P Note (Signed)
History and Physical Interval Note:  04/29/2014 1:44 PM  Dana Blackwell  has presented today for surgery, with the diagnosis of cp  The various methods of treatment have been discussed with the patient and family. After consideration of risks, benefits and other options for treatment, the patient has consented to  Procedure(s): LEFT HEART CATHETERIZATION WITH CORONARY ANGIOGRAM (N/A) as a surgical intervention .  The patient's history has been reviewed, patient examined, no change in status, stable for surgery.  I have reviewed the patient's chart and labs.  Questions were answered to the patient's satisfaction.    Cath Lab Visit (complete for each Cath Lab visit)  Clinical Evaluation Leading to the Procedure:   ACS: Yes.    Non-ACS:    Anginal Classification: CCS IV  Anti-ischemic medical therapy: Minimal Therapy (1 class of medications)  Non-Invasive Test Results: No non-invasive testing performed  Prior CABG: No previous CABG  Sherren Mocha MD

## 2014-04-29 NOTE — Interval H&P Note (Signed)
History and Physical Interval Note:  04/29/2014 7:40 PM  Dana Blackwell  has presented today for surgery, with the diagnosis of acute/urgent  The various methods of treatment have been discussed with the patient and family. After consideration of risks, benefits and other options for treatment, the patient has consented to  Procedure(s): LEFT HEART CATHETERIZATION WITH CORONARY ANGIOGRAM (N/A) as a surgical intervention .  The patient's history has been reviewed, patient examined, no change in status, stable for surgery.  I have reviewed the patient's chart and labs.  Questions were answered to the patient's satisfaction.    Cath Lab Visit (complete for each Cath Lab visit)  Clinical Evaluation Leading to the Procedure:   ACS: Yes.    Non-ACS:    Anginal Classification: CCS IV  Anti-ischemic medical therapy: Maximal Therapy (2 or more classes of medications)  Non-Invasive Test Results: No non-invasive testing performed  Prior CABG: No previous CABG       Sherren Mocha

## 2014-04-30 DIAGNOSIS — I5032 Chronic diastolic (congestive) heart failure: Secondary | ICD-10-CM

## 2014-04-30 DIAGNOSIS — I214 Non-ST elevation (NSTEMI) myocardial infarction: Secondary | ICD-10-CM

## 2014-04-30 DIAGNOSIS — E785 Hyperlipidemia, unspecified: Secondary | ICD-10-CM

## 2014-04-30 DIAGNOSIS — E119 Type 2 diabetes mellitus without complications: Secondary | ICD-10-CM

## 2014-04-30 DIAGNOSIS — N183 Chronic kidney disease, stage 3 unspecified: Secondary | ICD-10-CM

## 2014-04-30 DIAGNOSIS — I059 Rheumatic mitral valve disease, unspecified: Secondary | ICD-10-CM

## 2014-04-30 DIAGNOSIS — I10 Essential (primary) hypertension: Secondary | ICD-10-CM

## 2014-04-30 LAB — CBC
HCT: 32.2 % — ABNORMAL LOW (ref 36.0–46.0)
HEMOGLOBIN: 10 g/dL — AB (ref 12.0–15.0)
MCH: 27 pg (ref 26.0–34.0)
MCHC: 31.1 g/dL (ref 30.0–36.0)
MCV: 87 fL (ref 78.0–100.0)
Platelets: 268 10*3/uL (ref 150–400)
RBC: 3.7 MIL/uL — ABNORMAL LOW (ref 3.87–5.11)
RDW: 15.5 % (ref 11.5–15.5)
WBC: 8 10*3/uL (ref 4.0–10.5)

## 2014-04-30 LAB — CK TOTAL AND CKMB (NOT AT ARMC)
CK TOTAL: 469 U/L — AB (ref 7–177)
CK, MB: 46.6 ng/mL — AB (ref 0.3–4.0)
CK, MB: 44.7 ng/mL (ref 0.3–4.0)
RELATIVE INDEX: 9.5 — AB (ref 0.0–2.5)
Relative Index: 10.2 — ABNORMAL HIGH (ref 0.0–2.5)
Total CK: 458 U/L — ABNORMAL HIGH (ref 7–177)

## 2014-04-30 LAB — BASIC METABOLIC PANEL
Anion gap: 9 (ref 5–15)
BUN: 16 mg/dL (ref 6–23)
CALCIUM: 8.3 mg/dL — AB (ref 8.4–10.5)
CO2: 25 mEq/L (ref 19–32)
CREATININE: 1.06 mg/dL (ref 0.50–1.10)
Chloride: 102 mEq/L (ref 96–112)
GFR calc non Af Amer: 47 mL/min — ABNORMAL LOW (ref >90)
GFR, EST AFRICAN AMERICAN: 54 mL/min — AB (ref >90)
Glucose, Bld: 249 mg/dL — ABNORMAL HIGH (ref 70–99)
Potassium: 5 mEq/L (ref 3.7–5.3)
Sodium: 136 mEq/L — ABNORMAL LOW (ref 137–147)

## 2014-04-30 LAB — GLUCOSE, CAPILLARY
GLUCOSE-CAPILLARY: 155 mg/dL — AB (ref 70–99)
GLUCOSE-CAPILLARY: 237 mg/dL — AB (ref 70–99)
Glucose-Capillary: 197 mg/dL — ABNORMAL HIGH (ref 70–99)
Glucose-Capillary: 104 mg/dL — ABNORMAL HIGH (ref 70–99)

## 2014-04-30 LAB — TROPONIN I
Troponin I: 19.14 ng/mL (ref <0.30)
Troponin I: 10.47 ng/mL (ref <0.30)

## 2014-04-30 MED ORDER — POLYETHYLENE GLYCOL 3350 17 G PO PACK
17.0000 g | PACK | Freq: Every day | ORAL | Status: DC
  Administered 2014-05-01: 17 g via ORAL
  Filled 2014-04-30 (×4): qty 1

## 2014-04-30 MED ORDER — ISOSORBIDE MONONITRATE ER 30 MG PO TB24
30.0000 mg | ORAL_TABLET | Freq: Every day | ORAL | Status: DC
  Administered 2014-04-30 – 2014-05-02 (×3): 30 mg via ORAL
  Filled 2014-04-30 (×3): qty 1

## 2014-04-30 MED ORDER — METOPROLOL TARTRATE 50 MG PO TABS
50.0000 mg | ORAL_TABLET | Freq: Once | ORAL | Status: AC
  Administered 2014-04-30: 50 mg via ORAL
  Filled 2014-04-30: qty 1
  Filled 2014-04-30: qty 2

## 2014-04-30 MED ORDER — ALPRAZOLAM 0.5 MG PO TABS
0.5000 mg | ORAL_TABLET | Freq: Two times a day (BID) | ORAL | Status: DC | PRN
  Administered 2014-04-30: 0.5 mg via ORAL
  Filled 2014-04-30: qty 1

## 2014-04-30 MED ORDER — POLYETHYLENE GLYCOL 3350 17 G PO PACK
32.0000 g | PACK | Freq: Once | ORAL | Status: AC
  Administered 2014-04-30: 32 g via ORAL

## 2014-04-30 MED ORDER — AMLODIPINE BESYLATE 5 MG PO TABS
5.0000 mg | ORAL_TABLET | Freq: Every day | ORAL | Status: DC
  Administered 2014-04-30 – 2014-05-02 (×3): 5 mg via ORAL
  Filled 2014-04-30 (×3): qty 1

## 2014-04-30 MED FILL — Hydralazine HCl Inj 20 MG/ML: INTRAMUSCULAR | Qty: 1 | Status: AC

## 2014-04-30 MED FILL — Sodium Chloride IV Soln 0.9%: INTRAVENOUS | Qty: 50 | Status: AC

## 2014-04-30 NOTE — Progress Notes (Signed)
DAILY PROGRESS NOTE  Subjective:  Chest pain has resolved. Complains of constipation. Blood pressure remains uncontrolled.   Objective:  Temp:  [97.6 F (36.4 C)-98.2 F (36.8 C)] 97.6 F (36.4 C) (09/10 0700) Pulse Rate:  [58-106] 59 (09/10 0700) Resp:  [6-23] 17 (09/10 0700) BP: (107-203)/(42-107) 131/42 mmHg (09/10 0700) SpO2:  [97 %-100 %] 99 % (09/10 0700) FiO2 (%):  [99 %] 99 % (09/09 1710) Weight change: -1 lb 2.9 oz (-0.536 kg)  Intake/Output from previous day: 09/09 0701 - 09/10 0700 In: 1057.2 [I.V.:1057.2] Out: 575 [Urine:575]  Intake/Output from this shift:    Medications: Current Facility-Administered Medications  Medication Dose Route Frequency Provider Last Rate Last Dose  . 0.9 %  sodium chloride infusion  250 mL Intravenous PRN Sherren Mocha, MD      . acetaminophen (TYLENOL) tablet 1,000 mg  1,000 mg Oral QHS Larey Dresser, MD   1,000 mg at 04/29/14 2159  . acetaminophen (TYLENOL) tablet 650 mg  650 mg Oral Q4H PRN Sherren Mocha, MD      . ALPRAZolam Duanne Moron) tablet 0.25 mg  0.25 mg Oral TID PRN Evelene Croon Barrett, PA-C   0.25 mg at 04/29/14 1722  . aspirin chewable tablet 81 mg  81 mg Oral Daily Evelene Croon Barrett, PA-C   81 mg at 04/29/14 0942  . atorvastatin (LIPITOR) tablet 80 mg  80 mg Oral q1800 Rhonda G Barrett, PA-C      . cloNIDine (CATAPRES) tablet 0.1 mg  0.1 mg Oral BID Evelene Croon Barrett, PA-C   0.1 mg at 04/29/14 2159  . clopidogrel (PLAVIX) tablet 75 mg  75 mg Oral Daily Rhonda G Barrett, PA-C      . docusate sodium (COLACE) capsule 200 mg  200 mg Oral BID Evelene Croon Barrett, PA-C   200 mg at 04/29/14 2159  . folic acid-pyridoxine-cyancobalamin (FOLTX) 2.5-25-2 MG per tablet 1 tablet  1 tablet Oral Daily Lonn Georgia, PA-C   1 tablet at 04/29/14 0916  . furosemide (LASIX) tablet 40 mg  40 mg Oral Daily Rhonda G Barrett, PA-C      . heparin injection 5,000 Units  5,000 Units Subcutaneous 3 times per day Sherren Mocha, MD   5,000 Units at  04/30/14 [phone]  . hydrALAZINE (APRESOLINE) injection 10 mg  10 mg Intravenous Q4H PRN Evelene Croon Barrett, PA-C   10 mg at 04/29/14 1616  . HYDROcodone-acetaminophen (NORCO/VICODIN) 5-325 MG per tablet 1 tablet  1 tablet Oral Q6H PRN Rhonda G Barrett, PA-C      . Influenza vac split quadrivalent PF (FLUARIX) injection 0.5 mL  0.5 mL Intramuscular Tomorrow-1000 Larey Dresser, MD      . insulin aspart (novoLOG) injection 0-15 Units  0-15 Units Subcutaneous TID WC Rhonda G Barrett, PA-C      . insulin glargine (LANTUS) injection 10 Units  10 Units Subcutaneous QHS Evelene Croon Barrett, PA-C   10 Units at 04/29/14 2159  . levothyroxine (SYNTHROID, LEVOTHROID) tablet 75 mcg  75 mcg Oral QAC breakfast Evelene Croon Barrett, PA-C   75 mcg at 04/29/14 0900  . lisinopril (PRINIVIL,ZESTRIL) tablet 5 mg  5 mg Oral Daily Evelene Croon Barrett, PA-C   5 mg at 04/29/14 0916  . metoprolol (LOPRESSOR) injection 5 mg  5 mg Intravenous Q2H PRN Sherren Mocha, MD   5 mg at 04/30/14 0018  . metoprolol (LOPRESSOR) tablet 50 mg  50 mg Oral BID Sherren Mocha, MD   50 mg at 04/29/14  2158  . nitroGLYCERIN 50 mg in dextrose 5 % 250 mL (0.2 mg/mL) infusion  2-200 mcg/min Intravenous Titrated Evelene Croon Barrett, PA-C 9 mL/hr at 04/30/14 0643 30 mcg/min at 04/30/14 0643  . omega-3 acid ethyl esters (LOVAZA) capsule 1 g  1 g Oral Daily Larey Dresser, MD   1 g at 04/29/14 [phone]  . ondansetron (ZOFRAN) injection 4 mg  4 mg Intravenous Q6H PRN Sherren Mocha, MD   4 mg at 04/29/14 1550  . pantoprazole (PROTONIX) EC tablet 40 mg  40 mg Oral Daily Evelene Croon Barrett, PA-C   40 mg at 04/29/14 0942  . pneumococcal 23 valent vaccine (PNU-IMMUNE) injection 0.5 mL  0.5 mL Intramuscular Tomorrow-1000 Larey Dresser, MD      . sertraline (ZOLOFT) tablet 100 mg  100 mg Oral Daily Evelene Croon Barrett, PA-C   100 mg at 04/29/14 0917  . sodium chloride 0.9 % injection 3 mL  3 mL Intravenous Q12H Sherren Mocha, MD   3 mL at 04/29/14 2205  . sodium chloride 0.9 %  injection 3 mL  3 mL Intravenous PRN Sherren Mocha, MD      . traMADol Veatrice Bourbon) tablet 50-100 mg  50-100 mg Oral Q6H PRN Lonn Georgia, PA-C        Physical Exam: General appearance: alert and no distress Neck: no carotid bruit and no JVD Lungs: clear to auscultation bilaterally Heart: regular rate and rhythm, S1, S2 normal, no murmur, click, rub or gallop Abdomen: soft, non-tender; bowel sounds normal; no masses,  no organomegaly Extremities: extremities normal, atraumatic, no cyanosis or edema and groin without ecchymosis, hematoma or bruit Pulses: 2+ and symmetric Skin: Skin color, texture, turgor normal. No rashes or lesions Neurologic: Grossly normal Psych: Pleasant, normal mood  Lab Results: Results for orders placed during the hospital encounter of 04/29/14 (from the past 48 hour(s))  CBC     Status: Abnormal   Collection Time    04/29/14  4:40 AM      Result Value Ref Range   WBC 9.2  4.0 - 10.5 K/uL   RBC 3.93  3.87 - 5.11 MIL/uL   Hemoglobin 10.7 (*) 12.0 - 15.0 g/dL   HCT 33.9 (*) 36.0 - 46.0 %   MCV 86.3  78.0 - 100.0 fL   MCH 27.2  26.0 - 34.0 pg   MCHC 31.6  30.0 - 36.0 g/dL   RDW 15.4  11.5 - 15.5 %   Platelets 274  150 - 400 K/uL  BASIC METABOLIC PANEL     Status: Abnormal   Collection Time    04/29/14  4:40 AM      Result Value Ref Range   Sodium 142  137 - 147 mEq/L   Potassium 4.5  3.7 - 5.3 mEq/L   Chloride 102  96 - 112 mEq/L   CO2 30  19 - 32 mEq/L   Glucose, Bld 205 (*) 70 - 99 mg/dL   BUN 22  6 - 23 mg/dL   Creatinine, Ser 1.33 (*) 0.50 - 1.10 mg/dL   Calcium 9.1  8.4 - 10.5 mg/dL   GFR calc non Af Amer 36 (*) >90 mL/min   GFR calc Af Amer 41 (*) >90 mL/min   Comment: (NOTE)     The eGFR has been calculated using the CKD EPI equation.     This calculation has not been validated in all clinical situations.     eGFR's persistently <90 mL/min signify possible Chronic Kidney  Disease.   Anion gap 10  5 - 15  I-STAT TROPOININ, ED      Status: Abnormal   Collection Time    04/29/14  4:45 AM      Result Value Ref Range   Troponin i, poc 0.48 (*) 0.00 - 0.08 ng/mL   Comment NOTIFIED PHYSICIAN     Comment 3            Comment: Due to the release kinetics of cTnI,     a negative result within the first hours     of the onset of symptoms does not rule out     myocardial infarction with certainty.     If myocardial infarction is still suspected,     repeat the test at appropriate intervals.  MRSA PCR SCREENING     Status: None   Collection Time    04/29/14  7:55 AM      Result Value Ref Range   MRSA by PCR NEGATIVE  NEGATIVE   Comment:            The GeneXpert MRSA Assay (FDA     approved for NASAL specimens     only), is one component of a     comprehensive MRSA colonization     surveillance program. It is not     intended to diagnose MRSA     infection nor to guide or     monitor treatment for     MRSA infections.  GLUCOSE, CAPILLARY     Status: Abnormal   Collection Time    04/29/14  8:16 AM      Result Value Ref Range   Glucose-Capillary 202 (*) 70 - 99 mg/dL  GLUCOSE, CAPILLARY     Status: Abnormal   Collection Time    04/29/14 12:44 PM      Result Value Ref Range   Glucose-Capillary 197 (*) 70 - 99 mg/dL  PROTIME-INR     Status: None   Collection Time    04/29/14 12:50 PM      Result Value Ref Range   Prothrombin Time 14.6  11.6 - 15.2 seconds   INR 1.14  0.00 - 1.49  POCT ACTIVATED CLOTTING TIME     Status: None   Collection Time    04/29/14  2:25 PM      Result Value Ref Range   Activated Clotting Time 321    GLUCOSE, CAPILLARY     Status: Abnormal   Collection Time    04/29/14  3:41 PM      Result Value Ref Range   Glucose-Capillary 208 (*) 70 - 99 mg/dL  POCT ACTIVATED CLOTTING TIME     Status: None   Collection Time    04/29/14  7:13 PM      Result Value Ref Range   Activated Clotting Time 382    CK TOTAL AND CKMB     Status: Abnormal   Collection Time    04/29/14  9:21 PM       Result Value Ref Range   Total CK 218 (*) 7 - 177 U/L   CK, MB 18.2 (*) 0.3 - 4.0 ng/mL   Comment: CRITICAL RESULT CALLED TO, READ BACK BY AND VERIFIED WITH:     [place] CRAVEN,RN [id number] [address] [facility] M   Relative Index 8.3 (*) 0.0 - 2.5  TROPONIN I     Status: Abnormal   Collection Time    04/29/14  9:21 PM  Result Value Ref Range   Troponin I 3.88 (*) <0.30 ng/mL   Comment:            Due to the release kinetics of cTnI,     a negative result within the first hours     of the onset of symptoms does not rule out     myocardial infarction with certainty.     If myocardial infarction is still suspected,     repeat the test at appropriate intervals.     CRITICAL RESULT CALLED TO, READ BACK BY AND VERIFIED WITH:     [place] CRAVEN,RN [id number] [id number] [facility] M  CBC     Status: Abnormal   Collection Time    04/29/14  9:21 PM      Result Value Ref Range   WBC 8.1  4.0 - 10.5 K/uL   RBC 4.13  3.87 - 5.11 MIL/uL   Hemoglobin 11.2 (*) 12.0 - 15.0 g/dL   HCT 36.0  36.0 - 46.0 %   MCV 87.2  78.0 - 100.0 fL   MCH 27.1  26.0 - 34.0 pg   MCHC 31.1  30.0 - 36.0 g/dL   RDW 15.6 (*) 11.5 - 15.5 %   Platelets 319  150 - 400 K/uL  CREATININE, SERUM     Status: Abnormal   Collection Time    04/29/14  9:21 PM      Result Value Ref Range   Creatinine, Ser 0.99  0.50 - 1.10 mg/dL   GFR calc non Af Amer 51 (*) >90 mL/min   GFR calc Af Amer 59 (*) >90 mL/min   Comment: (NOTE)     The eGFR has been calculated using the CKD EPI equation.     This calculation has not been validated in all clinical situations.     eGFR's persistently <90 mL/min signify possible Chronic Kidney     Disease.  GLUCOSE, CAPILLARY     Status: Abnormal   Collection Time    04/29/14  9:42 PM      Result Value Ref Range   Glucose-Capillary 302 (*) 70 - 99 mg/dL   Comment 1 Notify RN    CBC     Status: Abnormal   Collection Time    04/30/14  3:15 AM      Result Value Ref Range   WBC 8.0  4.0 - 10.5 K/uL   RBC 3.70 (*)  3.87 - 5.11 MIL/uL   Hemoglobin 10.0 (*) 12.0 - 15.0 g/dL   HCT 32.2 (*) 36.0 - 46.0 %   MCV 87.0  78.0 - 100.0 fL   MCH 27.0  26.0 - 34.0 pg   MCHC 31.1  30.0 - 36.0 g/dL   RDW 15.5  11.5 - 15.5 %   Platelets 268  150 - 400 K/uL  BASIC METABOLIC PANEL     Status: Abnormal   Collection Time    04/30/14  3:15 AM      Result Value Ref Range   Sodium 136 (*) 137 - 147 mEq/L   Potassium 5.0  3.7 - 5.3 mEq/L   Chloride 102  96 - 112 mEq/L   CO2 25  19 - 32 mEq/L   Glucose, Bld 249 (*) 70 - 99 mg/dL   BUN 16  6 - 23 mg/dL   Creatinine, Ser 1.06  0.50 - 1.10 mg/dL   Calcium 8.3 (*) 8.4 - 10.5 mg/dL   GFR calc non Af Amer 47 (*) >90 mL/min  GFR calc Af Amer 54 (*) >90 mL/min   Comment: (NOTE)     The eGFR has been calculated using the CKD EPI equation.     This calculation has not been validated in all clinical situations.     eGFR's persistently <90 mL/min signify possible Chronic Kidney     Disease.   Anion gap 9  5 - 15  CK TOTAL AND CKMB     Status: Abnormal   Collection Time    04/30/14  3:15 AM      Result Value Ref Range   Total CK 458 (*) 7 - 177 U/L   CK, MB 46.6 (*) 0.3 - 4.0 ng/mL   Comment: CRITICAL VALUE NOTED.  VALUE IS CONSISTENT WITH PREVIOUSLY REPORTED AND CALLED VALUE.   Relative Index 10.2 (*) 0.0 - 2.5  TROPONIN I     Status: Abnormal   Collection Time    04/30/14  3:15 AM      Result Value Ref Range   Troponin I 19.14 (*) <0.30 ng/mL   Comment:            Due to the release kinetics of cTnI,     a negative result within the first hours     of the onset of symptoms does not rule out     myocardial infarction with certainty.     If myocardial infarction is still suspected,     repeat the test at appropriate intervals.     CRITICAL VALUE NOTED.  VALUE IS CONSISTENT WITH PREVIOUSLY REPORTED AND CALLED VALUE.  GLUCOSE, CAPILLARY     Status: Abnormal   Collection Time    04/30/14  7:33 AM      Result Value Ref Range   Glucose-Capillary 197 (*) 70 - 99  mg/dL    Imaging: Dg Chest Port 1 View  04/29/2014   CLINICAL DATA:  Chest pain and shortness of breath.  EXAM: PORTABLE CHEST - 1 VIEW  COMPARISON:  02/19/2014  FINDINGS: Shallow inspiration. Heart size and pulmonary vascularity are normal, demonstrating some improvement since previous study. Linear atelectasis in the right lung base. No focal consolidation. No blunting of costophrenic angles. No pneumothorax. Degenerative changes in the spine and shoulders.  IMPRESSION: Shallow inspiration with atelectasis in the right lung base.   Electronically Signed   By: Lucienne Capers M.D.   On: 04/29/2014 04:36    Assessment:  1. Principal Problem: 2.   NSTEMI (non-ST elevated myocardial infarction) 3. Active Problems: 4.   Anemia 5.   Type I (juvenile type) diabetes mellitus with peripheral circulatory disorders, not stated as uncontrolled(250.71) 6.   Hypertension 7.   Hyperlipidemia 8.   Plan:  1. Feeling better today. Chest pain has resolved. Had cutting balloon intervention for ISR to the mid-LAD and PCI to the distal LCx. Now on Plavix and asa. Check P2y12 when she reaches steady state. Blood pressure needs better control. Add imdur 30 mg daily today. Wean off of nitro gtts. Add amlodipine 5 mg daily for BP. Will try wean clonidine if we can, possibly going to 0.1 mg QHS tomorrow. She is concerned about constipation. Add miralax today. Ok to transfer to stepdown. Anticipate d/c home possibly tomorrow.  Time Spent Directly with Patient:  15 minutes  Length of Stay:  LOS: 1 day   Pixie Casino, MD, [facility] Attending Cardiologist [name]  Shaughnessy Gethers C 04/30/2014, 8:49 AM

## 2014-04-30 NOTE — Progress Notes (Signed)
Came by to check on patient. She is complaining of recurrent pain in her chest. Feels similar to symptoms yesterday. Labs reviewed and troponin values trending down. She is anxious and tearful - I think anxiety is a major issue.   Will give Xanax 0.5 mg now and additional metoprolol. Otherwise continue current Rx. Check 12-lead EKG.  Sherren Mocha 04/30/2014 5:29 PM

## 2014-04-30 NOTE — Progress Notes (Signed)
CARDIAC REHAB PHASE I   PRE:  Rate/Rhythm: 73 SR  BP:  Supine: 154/62  Sitting:   Standing:    SaO2: 100% 2L  MODE:  Ambulation: 390 ft   POST:  Rate/Rhythm: 88 SR  BP:  Supine:   Sitting: 189/66, 179/62  Standing:    SaO2: 98% 2L 0945-1050 Pt on home oxygen so did not try to wean off. Pt walked 390 ft on 2L after going to bathroom with gait belt use and rolling walker and asst x 1. Gait a little wobbly to begin with but got better with walk. No CP. To recliner with call bell after walk. Discussed plavix/stent, NTG use and MI restrictions. Pt stated she tried to do CRP 2 last admission but her back hurt too much so declined at this time. Stated will walk with rollator at home. Gave stent and MI booklets.   Graylon Good, RN BSN  04/30/2014 10:45 AM

## 2014-04-30 NOTE — Progress Notes (Signed)
Patients CKMB of 44.7 and Troponin of 10.47 are critical values but are expected due to this patients recatheterization. Patient has no complaints of chest pain or tightness and has not had any EKG changes. [facility] aware.

## 2014-04-30 NOTE — Care Management Note (Addendum)
    Page 1 of 1   05/01/2014     3:56:43 PM CARE MANAGEMENT NOTE 05/01/2014  Patient:  Dana Blackwell, Dana Blackwell   Account Number:  [id number]  Date Initiated:  04/30/2014  Documentation initiated by:  Elissa Hefty  Subjective/Objective Assessment:   adm w mi     Action/Plan:   lives alone, pcp dr Dana Blackwell   Anticipated DC Date:     Anticipated DC Plan:  [place]  CM consult      [place]   Choice offered to / List presented to:  [name]        [place] arranged  HH-1 RN  [place]      [place] agency  [place]   Status of service:   Medicare Important Message given?   (If response is "NO", the following Medicare IM given date fields will be blank) Date Medicare IM given:   Medicare IM given by:   Date Additional Medicare IM given:   Additional Medicare IM given by:    Discharge Disposition:  [place]  Per UR Regulation:  Reviewed for med. necessity/level of care/duration of stay  If discussed at Long Length of Stay Meetings, dates discussed:    Comments:  9/11 1554 Dana Chiara Coltrin rn,bsn spoke w pt. she recently had [facility]. she lives alone and would like them again. tent ref w mary w [facility] and left sticky note for md to put in orders. poss dc on 9-12.

## 2014-04-30 NOTE — Progress Notes (Signed)
Echocardiogram 2D Echocardiogram has been performed.  Ovide Dusek 04/30/2014, 11:57 AM

## 2014-05-01 DIAGNOSIS — I2510 Atherosclerotic heart disease of native coronary artery without angina pectoris: Secondary | ICD-10-CM

## 2014-05-01 DIAGNOSIS — E1059 Type 1 diabetes mellitus with other circulatory complications: Secondary | ICD-10-CM

## 2014-05-01 LAB — GLUCOSE, CAPILLARY
Glucose-Capillary: 97 mg/dL (ref 70–99)
Glucose-Capillary: 218 mg/dL — ABNORMAL HIGH (ref 70–99)
Glucose-Capillary: 150 mg/dL — ABNORMAL HIGH (ref 70–99)
Glucose-Capillary: 166 mg/dL — ABNORMAL HIGH (ref 70–99)

## 2014-05-01 LAB — BASIC METABOLIC PANEL
Anion gap: 9 (ref 5–15)
BUN: 22 mg/dL (ref 6–23)
CHLORIDE: 95 meq/L — AB (ref 96–112)
CO2: 28 mEq/L (ref 19–32)
Calcium: 8.8 mg/dL (ref 8.4–10.5)
Creatinine, Ser: 1.3 mg/dL — ABNORMAL HIGH (ref 0.50–1.10)
GFR calc Af Amer: 42 mL/min — ABNORMAL LOW (ref >90)
GFR, EST NON AFRICAN AMERICAN: 37 mL/min — AB (ref >90)
GLUCOSE: 124 mg/dL — AB (ref 70–99)
POTASSIUM: 4.5 meq/L (ref 3.7–5.3)
Sodium: 132 mEq/L — ABNORMAL LOW (ref 137–147)

## 2014-05-01 LAB — CBC
HEMATOCRIT: 33.1 % — AB (ref 36.0–46.0)
Hemoglobin: 10.2 g/dL — ABNORMAL LOW (ref 12.0–15.0)
MCH: 26.9 pg (ref 26.0–34.0)
MCHC: 30.8 g/dL (ref 30.0–36.0)
MCV: 87.3 fL (ref 78.0–100.0)
Platelets: 285 10*3/uL (ref 150–400)
RBC: 3.79 MIL/uL — ABNORMAL LOW (ref 3.87–5.11)
RDW: 15.6 % — ABNORMAL HIGH (ref 11.5–15.5)
WBC: 10.4 10*3/uL (ref 4.0–10.5)

## 2014-05-01 MED ORDER — INSULIN GLARGINE 100 UNIT/ML ~~LOC~~ SOLN
20.0000 [IU] | Freq: Every day | SUBCUTANEOUS | Status: DC
  Filled 2014-05-01: qty 0.2

## 2014-05-01 MED ORDER — CLONIDINE HCL 0.1 MG PO TABS
0.1000 mg | ORAL_TABLET | Freq: Every day | ORAL | Status: DC
  Administered 2014-05-01: 0.1 mg via ORAL
  Filled 2014-05-01 (×2): qty 1

## 2014-05-01 MED ORDER — INSULIN GLARGINE 100 UNIT/ML ~~LOC~~ SOLN
10.0000 [IU] | Freq: Every day | SUBCUTANEOUS | Status: DC
  Administered 2014-05-01: 10 [IU] via SUBCUTANEOUS
  Filled 2014-05-01 (×2): qty 0.1

## 2014-05-01 MED ORDER — SORBITOL 70 % SOLN
30.0000 mL | Freq: Once | Status: DC
  Filled 2014-05-01: qty 30

## 2014-05-01 NOTE — Progress Notes (Signed)
Inpatient Diabetes Program Recommendations  AACE/ADA: New Consensus Statement on Inpatient Glycemic Control (2013)  Target Ranges:  Prepandial:   less than 140 mg/dL      Peak postprandial:   less than 180 mg/dL (1-2 hours)      Critically ill patients:  140 - 180 mg/dL   Recommend decreasing Lantus back to 10 units from 20 and add Novolog HS scale.  Patient's fasting was 97 this morning and fear 20 units would be too much for her. Called RN Josh and he gave message to Dr. Debara Pickett who was present. Thank you  Raoul Pitch BSN, RN,CDE Inpatient Diabetes Coordinator [phone] (team pager)

## 2014-05-01 NOTE — Progress Notes (Signed)
CARDIAC REHAB PHASE I   PRE:  Rate/Rhythm: 60 SR    BP: sitting 110/49    SaO2: 98 2L  MODE:  Ambulation: 390 ft   POST:  Rate/Rhythm: 76 SR    BP: sitting 135/40     SaO2: 96 2L  Pt to BR upon awaking and disimpacted herself. HR stable. Able to walk without problems, no CP. Return to recliner and pt HR dropped from 60 SR to 39 SB then back up to 60 SR. Pt asymptomatic, this drop was over less than 5 seconds.  [id number]  Dana Blackwell [place] CES, ACSM 05/01/2014 12:27 PM

## 2014-05-01 NOTE — Progress Notes (Addendum)
DAILY PROGRESS NOTE  Subjective:  Some mild recurrent chest discomfort yesterday. Metoprolol and xanax added. BP improved today.  Objective:  Temp:  [97.7 F (36.5 C)-98.7 F (37.1 C)] 97.9 F (36.6 C) (09/11 0700) Pulse Rate:  [56-85] 64 (09/11 1000) Resp:  [0-23] 21 (09/11 1000) BP: (106-171)/(34-71) 141/55 mmHg (09/11 0914) SpO2:  [97 %-100 %] 100 % (09/11 1000) Weight change:   Intake/Output from previous day: 09/10 0701 - 09/11 0700 In: 518.2 [P.O.:480; I.V.:38.2] Out: -   Intake/Output from this shift: Total I/O In: -  Out: 1 [Urine:1]  Medications: Current Facility-Administered Medications  Medication Dose Route Frequency Provider Last Rate Last Dose  . 0.9 %  sodium chloride infusion  250 mL Intravenous PRN Sherren Mocha, MD      . acetaminophen (TYLENOL) tablet 1,000 mg  1,000 mg Oral QHS Larey Dresser, MD   1,000 mg at 04/30/14 2141  . acetaminophen (TYLENOL) tablet 650 mg  650 mg Oral Q4H PRN Sherren Mocha, MD      . ALPRAZolam Duanne Moron) tablet 0.5 mg  0.5 mg Oral BID PRN Sherren Mocha, MD   0.5 mg at 04/30/14 1803  . amLODipine (NORVASC) tablet 5 mg  5 mg Oral Daily Pixie Casino, MD   5 mg at 05/01/14 0914  . aspirin chewable tablet 81 mg  81 mg Oral Daily Evelene Croon Barrett, PA-C   81 mg at 05/01/14 0916  . atorvastatin (LIPITOR) tablet 80 mg  80 mg Oral q1800 Rhonda G Barrett, PA-C   80 mg at 04/30/14 1803  . cloNIDine (CATAPRES) tablet 0.1 mg  0.1 mg Oral BID Evelene Croon Barrett, PA-C   0.1 mg at 05/01/14 0914  . clopidogrel (PLAVIX) tablet 75 mg  75 mg Oral Daily Evelene Croon Barrett, PA-C   75 mg at 05/01/14 0915  . docusate sodium (COLACE) capsule 200 mg  200 mg Oral BID Evelene Croon Barrett, PA-C   200 mg at 05/01/14 0916  . folic acid-pyridoxine-cyancobalamin (FOLTX) 2.5-25-2 MG per tablet 1 tablet  1 tablet Oral Daily Evelene Croon Barrett, PA-C   1 tablet at 05/01/14 0915  . furosemide (LASIX) tablet 40 mg  40 mg Oral Daily Rhonda G Barrett, PA-C   40 mg at  05/01/14 0915  . heparin injection 5,000 Units  5,000 Units Subcutaneous 3 times per day Sherren Mocha, MD   5,000 Units at 05/01/14 0636  . hydrALAZINE (APRESOLINE) injection 10 mg  10 mg Intravenous Q4H PRN Evelene Croon Barrett, PA-C   10 mg at 04/30/14 1624  . HYDROcodone-acetaminophen (NORCO/VICODIN) 5-325 MG per tablet 1 tablet  1 tablet Oral Q6H PRN Rhonda G Barrett, PA-C      . insulin aspart (novoLOG) injection 0-15 Units  0-15 Units Subcutaneous TID WC Rhonda G Barrett, PA-C   3 Units at 04/30/14 1308  . insulin glargine (LANTUS) injection 10 Units  10 Units Subcutaneous QHS Evelene Croon Barrett, PA-C   10 Units at 04/30/14 2144  . isosorbide mononitrate (IMDUR) 24 hr tablet 30 mg  30 mg Oral Daily Pixie Casino, MD   30 mg at 05/01/14 0914  . levothyroxine (SYNTHROID, LEVOTHROID) tablet 75 mcg  75 mcg Oral QAC breakfast Evelene Croon Barrett, PA-C   75 mcg at 05/01/14 0636  . lisinopril (PRINIVIL,ZESTRIL) tablet 5 mg  5 mg Oral Daily Rhonda G Barrett, PA-C   5 mg at 05/01/14 0915  . metoprolol (LOPRESSOR) injection 5 mg  5 mg Intravenous Q2H PRN Legrand Como  Burt Knack, MD   5 mg at 04/30/14 0018  . metoprolol (LOPRESSOR) tablet 50 mg  50 mg Oral BID Sherren Mocha, MD   50 mg at 05/01/14 0914  . omega-3 acid ethyl esters (LOVAZA) capsule 1 g  1 g Oral Daily Larey Dresser, MD   1 g at 05/01/14 0915  . ondansetron (ZOFRAN) injection 4 mg  4 mg Intravenous Q6H PRN Sherren Mocha, MD   4 mg at 04/29/14 1550  . pantoprazole (PROTONIX) EC tablet 40 mg  40 mg Oral Daily Evelene Croon Barrett, PA-C   40 mg at 05/01/14 0916  . polyethylene glycol (MIRALAX / GLYCOLAX) packet 17 g  17 g Oral Daily Pixie Casino, MD   17 g at 05/01/14 0916  . sertraline (ZOLOFT) tablet 100 mg  100 mg Oral Daily Evelene Croon Barrett, PA-C   100 mg at 05/01/14 0915  . sodium chloride 0.9 % injection 3 mL  3 mL Intravenous Q12H Sherren Mocha, MD   3 mL at 05/01/14 0916  . sodium chloride 0.9 % injection 3 mL  3 mL Intravenous PRN Sherren Mocha, MD      . traMADol Veatrice Bourbon) tablet 50-100 mg  50-100 mg Oral Q6H PRN Lonn Georgia, PA-C        Physical Exam: General appearance: alert and no distress Neck: no carotid bruit and no JVD Lungs: clear to auscultation bilaterally Heart: regular rate and rhythm, S1, S2 normal, no murmur, click, rub or gallop Abdomen: soft, non-tender; bowel sounds normal; no masses,  no organomegaly Extremities: extremities normal, atraumatic, no cyanosis or edema and groin without ecchymosis, hematoma or bruit Pulses: 2+ and symmetric Skin: Skin color, texture, turgor normal. No rashes or lesions Neurologic: Grossly normal Psych: Pleasant, normal mood  Lab Results: Results for orders placed during the hospital encounter of 04/29/14 (from the past 48 hour(s))  GLUCOSE, CAPILLARY     Status: Abnormal   Collection Time    04/29/14 12:44 PM      Result Value Ref Range   Glucose-Capillary 197 (*) 70 - 99 mg/dL  PROTIME-INR     Status: None   Collection Time    04/29/14 12:50 PM      Result Value Ref Range   Prothrombin Time 14.6  11.6 - 15.2 seconds   INR 1.14  0.00 - 1.49  POCT ACTIVATED CLOTTING TIME     Status: None   Collection Time    04/29/14  2:25 PM      Result Value Ref Range   Activated Clotting Time 321    GLUCOSE, CAPILLARY     Status: Abnormal   Collection Time    04/29/14  3:41 PM      Result Value Ref Range   Glucose-Capillary 208 (*) 70 - 99 mg/dL  POCT ACTIVATED CLOTTING TIME     Status: None   Collection Time    04/29/14  7:13 PM      Result Value Ref Range   Activated Clotting Time 382    CK TOTAL AND CKMB     Status: Abnormal   Collection Time    04/29/14  9:21 PM      Result Value Ref Range   Total CK 218 (*) 7 - 177 U/L   CK, MB 18.2 (*) 0.3 - 4.0 ng/mL   Comment: CRITICAL RESULT CALLED TO, READ BACK BY AND VERIFIED WITH:     [place] CRAVEN,RN [phone] [facility] M   Relative Index 8.3 (*)  0.0 - 2.5  TROPONIN I     Status: Abnormal   Collection Time     04/29/14  9:21 PM      Result Value Ref Range   Troponin I 3.88 (*) <0.30 ng/mL   Comment:            Due to the release kinetics of cTnI,     a negative result within the first hours     of the onset of symptoms does not rule out     myocardial infarction with certainty.     If myocardial infarction is still suspected,     repeat the test at appropriate intervals.     CRITICAL RESULT CALLED TO, READ BACK BY AND VERIFIED WITH:     [place] CRAVEN,RN [phone] [facility] M  CBC     Status: Abnormal   Collection Time    04/29/14  9:21 PM      Result Value Ref Range   WBC 8.1  4.0 - 10.5 K/uL   RBC 4.13  3.87 - 5.11 MIL/uL   Hemoglobin 11.2 (*) 12.0 - 15.0 g/dL   HCT 36.0  36.0 - 46.0 %   MCV 87.2  78.0 - 100.0 fL   MCH 27.1  26.0 - 34.0 pg   MCHC 31.1  30.0 - 36.0 g/dL   RDW 15.6 (*) 11.5 - 15.5 %   Platelets 319  150 - 400 K/uL  CREATININE, SERUM     Status: Abnormal   Collection Time    04/29/14  9:21 PM      Result Value Ref Range   Creatinine, Ser 0.99  0.50 - 1.10 mg/dL   GFR calc non Af Amer 51 (*) >90 mL/min   GFR calc Af Amer 59 (*) >90 mL/min   Comment: (NOTE)     The eGFR has been calculated using the CKD EPI equation.     This calculation has not been validated in all clinical situations.     eGFR's persistently <90 mL/min signify possible Chronic Kidney     Disease.  GLUCOSE, CAPILLARY     Status: Abnormal   Collection Time    04/29/14  9:42 PM      Result Value Ref Range   Glucose-Capillary 302 (*) 70 - 99 mg/dL   Comment 1 Notify RN    CBC     Status: Abnormal   Collection Time    04/30/14  3:15 AM      Result Value Ref Range   WBC 8.0  4.0 - 10.5 K/uL   RBC 3.70 (*) 3.87 - 5.11 MIL/uL   Hemoglobin 10.0 (*) 12.0 - 15.0 g/dL   HCT 32.2 (*) 36.0 - 46.0 %   MCV 87.0  78.0 - 100.0 fL   MCH 27.0  26.0 - 34.0 pg   MCHC 31.1  30.0 - 36.0 g/dL   RDW 15.5  11.5 - 15.5 %   Platelets 268  150 - 400 K/uL  BASIC METABOLIC PANEL     Status: Abnormal   Collection  Time    04/30/14  3:15 AM      Result Value Ref Range   Sodium 136 (*) 137 - 147 mEq/L   Potassium 5.0  3.7 - 5.3 mEq/L   Chloride 102  96 - 112 mEq/L   CO2 25  19 - 32 mEq/L   Glucose, Bld 249 (*) 70 - 99 mg/dL   BUN 16  6 - 23 mg/dL   Creatinine,  Ser 1.06  0.50 - 1.10 mg/dL   Calcium 8.3 (*) 8.4 - 10.5 mg/dL   GFR calc non Af Amer 47 (*) >90 mL/min   GFR calc Af Amer 54 (*) >90 mL/min   Comment: (NOTE)     The eGFR has been calculated using the CKD EPI equation.     This calculation has not been validated in all clinical situations.     eGFR's persistently <90 mL/min signify possible Chronic Kidney     Disease.   Anion gap 9  5 - 15  CK TOTAL AND CKMB     Status: Abnormal   Collection Time    04/30/14  3:15 AM      Result Value Ref Range   Total CK 458 (*) 7 - 177 U/L   CK, MB 46.6 (*) 0.3 - 4.0 ng/mL   Comment: CRITICAL VALUE NOTED.  VALUE IS CONSISTENT WITH PREVIOUSLY REPORTED AND CALLED VALUE.   Relative Index 10.2 (*) 0.0 - 2.5  TROPONIN I     Status: Abnormal   Collection Time    04/30/14  3:15 AM      Result Value Ref Range   Troponin I 19.14 (*) <0.30 ng/mL   Comment:            Due to the release kinetics of cTnI,     a negative result within the first hours     of the onset of symptoms does not rule out     myocardial infarction with certainty.     If myocardial infarction is still suspected,     repeat the test at appropriate intervals.     CRITICAL VALUE NOTED.  VALUE IS CONSISTENT WITH PREVIOUSLY REPORTED AND CALLED VALUE.  GLUCOSE, CAPILLARY     Status: Abnormal   Collection Time    04/30/14  7:33 AM      Result Value Ref Range   Glucose-Capillary 197 (*) 70 - 99 mg/dL  CK TOTAL AND CKMB     Status: Abnormal   Collection Time    04/30/14 10:50 AM      Result Value Ref Range   Total CK 469 (*) 7 - 177 U/L   CK, MB 44.7 (*) 0.3 - 4.0 ng/mL   Comment: CRITICAL RESULT CALLED TO, READ BACK BY AND VERIFIED WITH:     CLEAVER J RN 04/30/14 1155 COSTELLO B    Relative Index 9.5 (*) 0.0 - 2.5  TROPONIN I     Status: Abnormal   Collection Time    04/30/14 10:50 AM      Result Value Ref Range   Troponin I 10.47 (*) <0.30 ng/mL   Comment:            Due to the release kinetics of cTnI,     a negative result within the first hours     of the onset of symptoms does not rule out     myocardial infarction with certainty.     If myocardial infarction is still suspected,     repeat the test at appropriate intervals.     CRITICAL RESULT CALLED TO, READ BACK BY AND VERIFIED WITH:     CLEAVER J RN 04/30/14 1155 COSTELLO B  GLUCOSE, CAPILLARY     Status: Abnormal   Collection Time    04/30/14 11:41 AM      Result Value Ref Range   Glucose-Capillary 155 (*) 70 - 99 mg/dL  GLUCOSE, CAPILLARY     Status: Abnormal  Collection Time    04/30/14  4:37 PM      Result Value Ref Range   Glucose-Capillary 104 (*) 70 - 99 mg/dL  GLUCOSE, CAPILLARY     Status: Abnormal   Collection Time    04/30/14  9:24 PM      Result Value Ref Range   Glucose-Capillary 237 (*) 70 - 99 mg/dL  CBC     Status: Abnormal   Collection Time    05/01/14  3:10 AM      Result Value Ref Range   WBC 10.4  4.0 - 10.5 K/uL   RBC 3.79 (*) 3.87 - 5.11 MIL/uL   Hemoglobin 10.2 (*) 12.0 - 15.0 g/dL   HCT 33.1 (*) 36.0 - 46.0 %   MCV 87.3  78.0 - 100.0 fL   MCH 26.9  26.0 - 34.0 pg   MCHC 30.8  30.0 - 36.0 g/dL   RDW 15.6 (*) 11.5 - 15.5 %   Platelets 285  150 - 400 K/uL  GLUCOSE, CAPILLARY     Status: None   Collection Time    05/01/14  7:57 AM      Result Value Ref Range   Glucose-Capillary 97  70 - 99 mg/dL    Imaging: No results found.  Assessment:  Principal Problem:   NSTEMI (non-ST elevated myocardial infarction) Active Problems:   Anemia   Type I (juvenile type) diabetes mellitus with peripheral circulatory disorders, not stated as uncontrolled(250.71)   Hypertension   Hyperlipidemia   Plan:  Feeling better today. Chest pain has resolved. BP improved.  Wean  clonidine to 0.1 mg QHS. Continue current medications. Still with constipation. BG's elevated during the day, but am BG <100. Continue lantus, may need to intensify SSI. Not ready for d/c today, possibly tomorrow if she has a BM.  Time Spent Directly with Patient:  15 minutes  Length of Stay:  LOS: 2 days   Pixie Casino, MD, [facility] Attending Cardiologist [name]  HILTY,Kenneth C 05/01/2014, 11:44 AM

## 2014-05-02 LAB — CBC
HEMATOCRIT: 30.9 % — AB (ref 36.0–46.0)
Hemoglobin: 9.7 g/dL — ABNORMAL LOW (ref 12.0–15.0)
MCH: 27.6 pg (ref 26.0–34.0)
MCHC: 31.4 g/dL (ref 30.0–36.0)
MCV: 88 fL (ref 78.0–100.0)
Platelets: 250 10*3/uL (ref 150–400)
RBC: 3.51 MIL/uL — AB (ref 3.87–5.11)
RDW: 15.4 % (ref 11.5–15.5)
WBC: 8.7 10*3/uL (ref 4.0–10.5)

## 2014-05-02 LAB — GLUCOSE, CAPILLARY
GLUCOSE-CAPILLARY: 303 mg/dL — AB (ref 70–99)
Glucose-Capillary: 129 mg/dL — ABNORMAL HIGH (ref 70–99)

## 2014-05-02 MED ORDER — NITROGLYCERIN 0.4 MG SL SUBL: 0.4000 mg | SUBLINGUAL_TABLET | SUBLINGUAL | Status: DC | PRN

## 2014-05-02 MED ORDER — METOPROLOL SUCCINATE ER 25 MG PO TB24: 25.0000 mg | ORAL_TABLET | Freq: Every day | ORAL | Status: DC

## 2014-05-02 MED ORDER — CLOPIDOGREL BISULFATE 75 MG PO TABS: 75.0000 mg | ORAL_TABLET | Freq: Every day | ORAL | Status: DC

## 2014-05-02 NOTE — Discharge Instructions (Signed)
PLEASE REMEMBER TO BRING ALL OF YOUR MEDICATIONS TO EACH OF YOUR FOLLOW-UP OFFICE VISITS. ° °PLEASE ATTEND ALL SCHEDULED FOLLOW-UP APPOINTMENTS.  ° °Activity: Increase activity slowly as tolerated. You may shower, but no soaking baths (or swimming) for 1 week. No driving for 1 week. No lifting over 5 lbs for 2 weeks. No sexual activity for 1 week.  ° °You May Return to Work: in 3 weeks (if applicable) ° °Wound Care: You may wash cath site gently with soap and water. Keep cath site clean and dry. If you notice pain, swelling, bleeding or pus at your cath site, please call [phone]. ° ° ° °Cardiac Cath Site Care °Refer to this sheet in the next few weeks. These instructions provide you with information on caring for yourself after your procedure. Your caregiver may also give you more specific instructions. Your treatment has been planned according to current medical practices, but problems sometimes occur. Call your caregiver if you have any problems or questions after your procedure. °HOME CARE INSTRUCTIONS °· You may shower 24 hours after the procedure. Remove the bandage (dressing) and gently wash the site with plain soap and water. Gently pat the site dry.  °· Do not apply powder or lotion to the site.  °· Do not sit in a bathtub, swimming pool, or whirlpool for 5 to 7 days.  °· No bending, squatting, or lifting anything over 10 pounds (4.5 kg) as directed by your caregiver.  °· Inspect the site at least twice daily.  °· Do not drive home if you are discharged the same day of the procedure. Have someone else drive you.  °· You may drive 24 hours after the procedure unless otherwise instructed by your caregiver.  °What to expect: °· Any bruising will usually fade within 1 to 2 weeks.  °· Blood that collects in the tissue (hematoma) may be painful to the touch. It should usually decrease in size and tenderness within 1 to 2 weeks.  °SEEK IMMEDIATE MEDICAL CARE IF: °· You have unusual pain at the site or down the  affected limb.  °· You have redness, warmth, swelling, or pain at the site.  °· You have drainage (other than a small amount of blood on the dressing).  °· You have chills.  °· You have a fever or persistent symptoms for more than 72 hours.  °· You have a fever and your symptoms suddenly get worse.  °· Your leg becomes pale, cool, tingly, or numb.  °· You have heavy bleeding from the site. Hold pressure on the site.  °Document Released: 09/09/2010 Document Revised: 07/27/2011 Document Reviewed:  ° °

## 2014-05-02 NOTE — Discharge Summary (Signed)
CARDIOLOGY DISCHARGE SUMMARY   Patient ID: Dana Blackwell MRN: [id number] DOB/AGE: Sep 07, 1927 80 y.o.  Admit date: 04/29/2014 Discharge date: 05/02/2014  PCP: [name], MD Primary Cardiologist: Dr. Burt Knack  Primary Discharge Diagnosis:  NSTEMI (non-ST elevated myocardial infarction) - s/p PTCA of the LAD as well as subsequent 2.5 x 16 mm Promus DES stent to the left distal circumflex  Secondary Discharge Diagnosis:    Anemia   Type I (juvenile type) diabetes mellitus with peripheral circulatory disorders, not stated as uncontrolled(250.71)   Hypertension   Hyperlipidemia  Procedures: Left Heart Cath, Selective Coronary Angiography, FFR and PTCA of the LAD, Catheter placement for angiography, Selective Coronary Angiography, PTCA/Stent of the left distal left circumflex (dominant vessel)  Hospital Course: Dana Blackwell is a 80 y.o. female with a history of CAD. She had been off aspirin and Plavix for 2 months after an SAH. She had been recently restarted on aspirin 81 mg. She woke with chest pain and came to the hospital where she was admitted for further evaluation and treatment.  EMS had treated her with sublingual nitroglycerin and aspirin. Her symptoms improved. She will then for an MI and was on aspirin, heparin and nitrates. She was started on a beta blocker was on a statin. Dr. Hal Neer with neurosurgery was contacted regarding anti-coagulation and stated she could be on Plavix if needed.  She was loaded with Plavix and taken to the cath lab on 04/29/2014. Results are below, she had PTCA to the LAD with FFR guidance. She initially tolerated the procedure well. However, after the procedure she had recurrent chest pain at 10/10 it was associated with nausea and hypertension. Dr. Burt Knack evaluated the patient and reviewed her ECG. She had dynamic ECG changes associated with the pain. She was having ongoing pain, not controlled on medical therapy, and was taken back to  cath lab on 04/29/2014.  The distal circumflex had a 90% stenosis leading into a PDA branch and this was treated with a drug-eluting stent, reducing the stenosis to 0. She tolerated the procedure well. Her pain improved.  She was seen by cardiac rehabilitation and educated on MI restrictions, heart-healthy lifestyle modifications and exercise guidelines. She was started on a beta blocker but had some transient bradycardia. The beta blocker dose was decreased and her blood pressure is stable.  Her blood sugars were managed with a combination of sliding scale insulin and her home medications. Hemoglobin A1c is 6.8. She is to continue her medications and followup with primary care.  On 09/12, she was seen by Dr. Haroldine Laws and all data were reviewed. She was ambulating without chest pain or shortness of breath and her vital signs were stable. No further inpatient workup is indicated and she is considered stable for discharge, to followup as an outpatient.  Labs:   Lab Results  Component Value Date   WBC 8.7 05/02/2014   HGB 9.7* 05/02/2014   HCT 30.9* 05/02/2014   MCV 88.0 05/02/2014   PLT 250 05/02/2014     Recent Labs Lab 05/01/14 1317  NA 132*  K 4.5  CL 95*  CO2 28  BUN 22  CREATININE 1.30*  CALCIUM 8.8  GLUCOSE 124*    Recent Labs  04/29/14 2121 04/30/14 0315 04/30/14 1050  CKTOTAL 218* 458* 469*  CKMB 18.2* 46.6* 44.7*  TROPONINI 3.88* 19.14* 10.47*   Lab Results  Component Value Date   HGBA1C 6.8* 11/28/2013    Radiology: Dg Chest Port 1 View 04/29/2014  CLINICAL DATA:  Chest pain and shortness of breath.  EXAM: PORTABLE CHEST - 1 VIEW  COMPARISON:  02/19/2014  FINDINGS: Shallow inspiration. Heart size and pulmonary vascularity are normal, demonstrating some improvement since previous study. Linear atelectasis in the right lung base. No focal consolidation. No blunting of costophrenic angles. No pneumothorax. Degenerative changes in the spine and shoulders.  IMPRESSION:  Shallow inspiration with atelectasis in the right lung base.   Electronically Signed   By: Lucienne Capers M.D.   On: 04/29/2014 04:36    Cardiac Cath: 04/29/2014, 3:30 PM Coronary angiography:  Coronary dominance: right  Left mainstem: Patent without obstructive disease  Left anterior descending (LAD): Heavily stented. The ostium of the LAD has 50-60% stenosis just before the stented segment. The proximal stents are widely patent. The mid-LAD stents have 50% stenosis followed by a focal 90% stenosis. The distal vessel is patent.  Left circumflex (LCx): Dominant vessel. The first OM has 75% proximal stenosis. This is unchanged from the previous study. The distal AV circumflex into the left PDA has 80-90% stenosis unchanged from previous.  Right coronary artery (RCA): Heavy Ca++ at the origin. Small, nondominant vessel without significant disease. Poorly visualized on today's study.  Left ventriculography: deferred  PCI Procedure Note: Following the diagnostic procedure, the decision was made to proceed with PCI. The sheath was upsized to a 6 [place]. Weight-based bivalirudin was given for anticoagulation. The patient had received plavix 600 mg orally. Once a therapeutic ACT was achieved, a 6 [place] XB-LAD 3.0 cm guide catheter was inserted. A cougar coronary guidewire was used to cross the lesion. I performed FFR to evaluate the physiologic significance of the ostial LAD stenosis as well as the mid vessel severe stenosis. IV adenosine was used. The baseline FFR proximally was 0.95 and distally was 0.82. With IV adenosine, the distal FFR was 0.73 and proximally was 0.92. The mid-lesion was predilated with a 2.5 mm Flextome cutting balloon. The lesion was then redilated with 2.75 mm Garner balloon to 16 atm. Following PCI, there was 10% residual stenosis and TIMI-3 flow. Final angiography confirmed an excellent result. Femoral hemostasis was achieved with a Perclose device. The patient tolerated the PCI  procedure well. There were no immediate procedural complications. The patient was transferred to the post catheterization recovery area for further monitoring.  PCI Data:  Vessel - LAD/Segment - mid (in-stent)  Percent Stenosis (pre) 90  TIMI-flow 3  Stent none (2.5 mm cutting balloon and 2.75 mm Rush balloon)  Percent Stenosis (post) 10  TIMI-flow (post) 3  Contrast: 115  Radiation dose/Fluoro time: 10.7 min  Estimated Blood Loss: minimal  Final Conclusions:  Multivessel CAD with stable moderate-severe disease in the LCx and severe in-stent restenosis in the LAD. Lad treated successful with PTCA using FFR-guidance.  Recommendations: continue aggressive medical therapy. Situation complicated by previous subarachnoid hemorrhage. Will place her back on ASA 81 mg and plavix 75 mg. Case discussed earlier today with neurosurgery, Dr Hal Neer.   Cardiac cath: 04/29/2014, 7:40 PM  Left mainstem: The left main is patent the distal left main as 30% stenosis  Left anterior descending (LAD): The ostium of the LAD has 60% stenosis before the stented segment. FFR was done earlier today and this area and the result was normal at 0.95. The stented segment in the LAD is patent throughout with no significant in-stent restenosis. The diagonal branch is diffusely diseased with 70% ostial stenosis.  Left circumflex (LCx): The left circumflex is a large, dominant vessel.  Proximally, the vessel is patent. The mid vessel is patent with mild nonobstructive disease. The first obtuse marginal branch has 70% ostial stenosis. The distal left circumflex a tight 90% stenosis leading into a left PDA branch.  Right coronary artery (RCA): Not selectively injected. Known to be a small, nondominant vessel.  Left ventriculography: Not performed  PCI Procedure Note: Following the diagnostic procedure, the decision was made to proceed with PCI of the distal left circumflex. She has been preloaded with Plavix from her procedure earlier  today. The LAD appeared unchanged and while the ostium of the vessel is diseased, the FFR from this afternoon and was entirely normal on the segment. As she had ongoing chest pain, the only treatable area appears to be the distal left circumflex where there is a focal 80-90% stenosis. Weight-based bivalirudin was given for anticoagulation. A cougar coronary guidewire was used to cross the lesion. The lesion was predilated with a 2.5 x 12 mm balloon. The lesion was then stented with a 2.5 x 16 mm Promus DES stent. The stent was postdilated with a 2.75 mm noncompliant balloon. Following PCI, there was 0% residual stenosis and TIMI-3 flow. Final angiography confirmed an excellent result. Femoral hemostasis was achieved with a Perclose device. The patient tolerated the PCI procedure well. There were no immediate procedural complications. The patient was transferred to the post catheterization recovery area for further monitoring.  PCI Data:  Vessel - left circumflex/Segment - distal  Percent Stenosis (pre) 90  TIMI-flow 3  Stent 2.5 x 16 mm Promus DES  Percent Stenosis (post) 0  TIMI-flow (post) 3  Contrast: 110 cc Omnipaque  Radiation dose/Fluoro time: 6.8 minutes  Estimated Blood Loss: Minimal  Final Conclusions:  1. Severe distal left circumflex stenosis treated successfully with PCI using a drug-eluting stent  2. Continued patency of the LAD with moderate ostial stenosis and a widely patent stented segment  Recommendations: Will monitor in the CCU overnight. We'll cycle enzymes and I suspect she has had a significant non-ST elevation infarction. Continue aggressive medical therapy with IV nitroglycerin, beta blockade, and antiplatelet therapy.  EKG: 04/30/2014 Sinus rhythm, no Q waves, no acute ischemic changes Vent. rate 76 BPM PR interval 136 ms QRS duration 90 ms QT/QTc 416/468 ms P-R-T axes 45 -6 38  Echo: 04/30/2014 - Left ventricle: The cavity size was normal. Wall thickness  was normal. Systolic function was normal. The estimated ejection fraction was in the range of 50% to 55%. Wall motion was normal; there were no regional wall motion abnormalities. Doppler parameters are consistent with abnormal left ventricular relaxation (grade 1 diastolic dysfunction). Doppler parameters are consistent with high ventricular filling pressure. - Mitral valve: Calcified annulus. There was mild regurgitation. - Left atrium: The atrium was severely dilated. - Pulmonary arteries: Systolic pressure was severely increased. PA peak pressure: 60 mm Hg (S). Impressions: - Normal LV function; grade 1 diastolic dysfunction with elevated left ventricular filling pressures; severe LAE; mild MR and TR; severely elevated pulmonary pressures.   FOLLOW UP PLANS AND APPOINTMENTS Allergies  Allergen Reactions  . Tetanus Toxoid Other (See Comments)    unknown     Medication List         acetaminophen 650 MG CR tablet  Commonly known as:  TYLENOL  Take 1,300 mg by mouth at bedtime.     ALPRAZolam 0.25 MG tablet  Commonly known as:  XANAX  Take 1 tablet (0.25 mg total) by mouth 3 (three) times daily as needed for anxiety.  aspirin 81 MG tablet  Take 81 mg by mouth daily.     atorvastatin 20 MG tablet  Commonly known as:  LIPITOR  Take 20 mg by mouth daily at 6 PM.     beta carotene w/minerals tablet  Take 1 tablet by mouth at bedtime.     calcium carbonate 600 MG Tabs tablet  Commonly known as:  OS-CAL  Take 600 mg by mouth daily.     cloNIDine 0.1 MG tablet  Commonly known as:  CATAPRES  Take 1 tablet (0.1 mg total) by mouth 2 (two) times daily.     clopidogrel 75 MG tablet  Commonly known as:  PLAVIX  Take 1 tablet (75 mg total) by mouth daily.     fish oil-omega-3 fatty acids 1000 MG capsule  Take 1 g by mouth daily.     FOLTX 2.5-25-2 MG Tabs  Generic drug:  folic acid-pyridoxine-cyancobalamin  Take 1 tablet by mouth daily.     furosemide 40 MG tablet   Commonly known as:  LASIX  Take 1 tablet (40 mg total) by mouth daily.     HYDROcodone-acetaminophen 5-325 MG per tablet  Commonly known as:  NORCO/VICODIN  Take 1 tablet by mouth every 6 (six) hours as needed for moderate pain.     insulin glargine 100 UNIT/ML injection  Commonly known as:  LANTUS  Inject 0.1 mLs (10 Units total) into the skin at bedtime.     insulin lispro 100 UNIT/ML injection  Commonly known as:  HUMALOG  Inject 4-5 Units into the skin 3 (three) times daily before meals.     isosorbide mononitrate 60 MG 24 hr tablet  Commonly known as:  IMDUR  Take 1 tablet (60 mg total) by mouth daily.     levothyroxine 75 MCG tablet  Commonly known as:  SYNTHROID, LEVOTHROID  Take 75 mcg by mouth daily.     lisinopril 5 MG tablet  Commonly known as:  PRINIVIL,ZESTRIL  Take 1 tablet (5 mg total) by mouth daily.     MAGNESIUM PO  Take 1 tablet by mouth at bedtime.     metoprolol succinate 25 MG 24 hr tablet  Commonly known as:  TOPROL XL  Take 1 tablet (25 mg total) by mouth daily.     nitroGLYCERIN 0.4 MG SL tablet  Commonly known as:  NITROSTAT  Place 1 tablet (0.4 mg total) under the tongue every 5 (five) minutes as needed for chest pain.     pantoprazole 40 MG tablet  Commonly known as:  PROTONIX  Take 40 mg by mouth daily.     sertraline 100 MG tablet  Commonly known as:  ZOLOFT  Take 100 mg by mouth daily.     STOOL SOFTENER PO  Take 1 tablet by mouth 2 (two) times daily as needed (constipation).     traMADol 50 MG tablet  Commonly known as:  ULTRAM  Take 1-2 tablets (50-100 mg total) by mouth every 6 (six) hours as needed (41m for mild pain, 762mfor moderate pain, 10064mor severe pain).     VITAMIN B-12 PO  Take 1 tablet by mouth daily.        Discharge Instructions   Diet - low sodium heart healthy    Complete by:  As directed      Diet Carb Modified    Complete by:  As directed      Increase activity slowly    Complete by:  As directed  Follow-up Information   Follow up with Sherren Mocha, MD. (The office will call)    Specialty:  Cardiology   Contact information:   [address] [address] [phone]       BRING ALL MEDICATIONS WITH YOU TO FOLLOW UP APPOINTMENTS  Time spent with patient to include physician time: 36 min Signed: Rosaria Ferries, PA-C 05/02/2014, 4:18 PM Co-Sign MD  Patient seen and examined with Rosaria Ferries, PA-C. We discussed all aspects of the encounter. I agree with the assessment and plan as stated above. Patients is ready for discharge. Please see my daily rounding note from earlier today for other details.   Daniel Bensimhon,MD 10:13 PM

## 2014-05-02 NOTE — Progress Notes (Signed)
"  Dana Blackwell", who is the person who will give patient a ride home, had called this nurse this am.  I instructed him that as soon as the PA had arrived to complete her paperwork, I would call him to pick her up, as he had a 20 minute drive. I received a call from Dana Blackwell, who stated that he had been waiting for the patient for  "over an hour".  He stated that the patient called him and stated the MD had discharged her.  No discharge orders were written and paperwork not completed.  There was a miscommunication due to her deafness and she felt she was going to be discharged immediately.  Dana Blackwell reiterated that "Dana Blackwell had better be down here within 15 minutes or heads would roll".  I stated that it would not be in his best interest to threaten anyone and that the PA had been contacted and will be here as soon as possible to complete discharge instructions.

## 2014-05-02 NOTE — Progress Notes (Signed)
DAILY PROGRESS NOTE  Subjective:  Some mild recurrent chest discomfort yesterday. Metoprolol and xanax added. BP improved today.  Objective:  Temp:  [97.9 F (36.6 C)-99 F (37.2 C)] 98.8 F (37.1 C) (09/12 1156) Pulse Rate:  [54-75] 60 (09/12 1156) Resp:  [16-20] 18 (09/12 1156) BP: (107-156)/(41-59) 149/59 mmHg (09/12 1156) SpO2:  [97 %-98 %] 98 % (09/12 1156) Weight change:   Intake/Output from previous day: 09/11 0701 - 09/12 0700 In: 1440 [P.O.:480; I.V.:960] Out: 6 [Urine:3; Stool:3]  Intake/Output from this shift: Total I/O In: 240 [P.O.:240] Out: -   Medications: Current Facility-Administered Medications  Medication Dose Route Frequency Provider Last Rate Last Dose  . 0.9 %  sodium chloride infusion  250 mL Intravenous PRN Sherren Mocha, MD      . acetaminophen (TYLENOL) tablet 1,000 mg  1,000 mg Oral QHS Larey Dresser, MD   1,000 mg at 05/01/14 2156  . acetaminophen (TYLENOL) tablet 650 mg  650 mg Oral Q4H PRN Sherren Mocha, MD      . ALPRAZolam Duanne Moron) tablet 0.5 mg  0.5 mg Oral BID PRN Sherren Mocha, MD   0.5 mg at 04/30/14 1803  . amLODipine (NORVASC) tablet 5 mg  5 mg Oral Daily Pixie Casino, MD   5 mg at 05/02/14 1100  . aspirin chewable tablet 81 mg  81 mg Oral Daily Evelene Croon Barrett, PA-C   81 mg at 05/02/14 1100  . atorvastatin (LIPITOR) tablet 80 mg  80 mg Oral q1800 Rhonda G Barrett, PA-C   80 mg at 05/01/14 1728  . cloNIDine (CATAPRES) tablet 0.1 mg  0.1 mg Oral Daily Pixie Casino, MD   0.1 mg at 05/01/14 2158  . clopidogrel (PLAVIX) tablet 75 mg  75 mg Oral Daily Rhonda G Barrett, PA-C   75 mg at 05/02/14 1100  . docusate sodium (COLACE) capsule 200 mg  200 mg Oral BID Rhonda G Barrett, PA-C   200 mg at 05/02/14 1100  . folic acid-pyridoxine-cyancobalamin (FOLTX) 2.5-25-2 MG per tablet 1 tablet  1 tablet Oral Daily Evelene Croon Barrett, PA-C   1 tablet at 05/02/14 1100  . furosemide (LASIX) tablet 40 mg  40 mg Oral Daily Rhonda G Barrett,  PA-C   40 mg at 05/02/14 1100  . heparin injection 5,000 Units  5,000 Units Subcutaneous 3 times per day Sherren Mocha, MD   5,000 Units at 05/02/14 0610  . HYDROcodone-acetaminophen (NORCO/VICODIN) 5-325 MG per tablet 1 tablet  1 tablet Oral Q6H PRN Rhonda G Barrett, PA-C      . insulin aspart (novoLOG) injection 0-15 Units  0-15 Units Subcutaneous TID WC Rhonda G Barrett, PA-C   11 Units at 05/02/14 1246  . insulin glargine (LANTUS) injection 10 Units  10 Units Subcutaneous QHS Pixie Casino, MD   10 Units at 05/01/14 2158  . isosorbide mononitrate (IMDUR) 24 hr tablet 30 mg  30 mg Oral Daily Pixie Casino, MD   30 mg at 05/02/14 1100  . levothyroxine (SYNTHROID, LEVOTHROID) tablet 75 mcg  75 mcg Oral QAC breakfast Evelene Croon Barrett, PA-C   75 mcg at 05/02/14 0800  . lisinopril (PRINIVIL,ZESTRIL) tablet 5 mg  5 mg Oral Daily Rhonda G Barrett, PA-C   5 mg at 05/02/14 1100  . metoprolol (LOPRESSOR) injection 5 mg  5 mg Intravenous Q2H PRN Sherren Mocha, MD   5 mg at 04/30/14 0018  . metoprolol (LOPRESSOR) tablet 50 mg  50 mg Oral BID Sherren Mocha,  MD   50 mg at 05/02/14 1100  . omega-3 acid ethyl esters (LOVAZA) capsule 1 g  1 g Oral Daily Larey Dresser, MD   1 g at 05/02/14 1100  . ondansetron (ZOFRAN) injection 4 mg  4 mg Intravenous Q6H PRN Sherren Mocha, MD   4 mg at 04/29/14 1550  . pantoprazole (PROTONIX) EC tablet 40 mg  40 mg Oral Daily Rhonda G Barrett, PA-C   40 mg at 05/02/14 1100  . sertraline (ZOLOFT) tablet 100 mg  100 mg Oral Daily Rhonda G Barrett, PA-C   100 mg at 05/02/14 1100  . sodium chloride 0.9 % injection 3 mL  3 mL Intravenous Q12H Sherren Mocha, MD   3 mL at 05/01/14 2200  . sodium chloride 0.9 % injection 3 mL  3 mL Intravenous PRN Sherren Mocha, MD      . traMADol Veatrice Bourbon) tablet 50-100 mg  50-100 mg Oral Q6H PRN Lonn Georgia, PA-C        Physical Exam: General appearance: alert and no distress Neck: no carotid bruit and no JVD Lungs: clear to  auscultation bilaterally Heart: regular rate and rhythm, S1, S2 normal, no murmur, click, rub or gallop Abdomen: soft, non-tender; bowel sounds normal; no masses,  no organomegaly Extremities: extremities normal, atraumatic, no cyanosis or edema and groin without ecchymosis, hematoma or bruit Pulses: 2+ and symmetric Skin: Skin color, texture, turgor normal. No rashes or lesions Neurologic: Grossly normal Psych: Pleasant, normal mood  Lab Results: Results for orders placed during the hospital encounter of 04/29/14 (from the past 48 hour(s))  GLUCOSE, CAPILLARY     Status: Abnormal   Collection Time    04/30/14  4:37 PM      Result Value Ref Range   Glucose-Capillary 104 (*) 70 - 99 mg/dL  GLUCOSE, CAPILLARY     Status: Abnormal   Collection Time    04/30/14  9:24 PM      Result Value Ref Range   Glucose-Capillary 237 (*) 70 - 99 mg/dL  CBC     Status: Abnormal   Collection Time    05/01/14  3:10 AM      Result Value Ref Range   WBC 10.4  4.0 - 10.5 K/uL   RBC 3.79 (*) 3.87 - 5.11 MIL/uL   Hemoglobin 10.2 (*) 12.0 - 15.0 g/dL   HCT 33.1 (*) 36.0 - 46.0 %   MCV 87.3  78.0 - 100.0 fL   MCH 26.9  26.0 - 34.0 pg   MCHC 30.8  30.0 - 36.0 g/dL   RDW 15.6 (*) 11.5 - 15.5 %   Platelets 285  150 - 400 K/uL  GLUCOSE, CAPILLARY     Status: None   Collection Time    05/01/14  7:57 AM      Result Value Ref Range   Glucose-Capillary 97  70 - 99 mg/dL  GLUCOSE, CAPILLARY     Status: Abnormal   Collection Time    05/01/14 11:42 AM      Result Value Ref Range   Glucose-Capillary 218 (*) 70 - 99 mg/dL  BASIC METABOLIC PANEL     Status: Abnormal   Collection Time    05/01/14  1:17 PM      Result Value Ref Range   Sodium 132 (*) 137 - 147 mEq/L   Potassium 4.5  3.7 - 5.3 mEq/L   Chloride 95 (*) 96 - 112 mEq/L   CO2 28  19 - 32 mEq/L  Glucose, Bld 124 (*) 70 - 99 mg/dL   BUN 22  6 - 23 mg/dL   Creatinine, Ser 1.30 (*) 0.50 - 1.10 mg/dL   Calcium 8.8  8.4 - 10.5 mg/dL   GFR calc non  Af Amer 37 (*) >90 mL/min   GFR calc Af Amer 42 (*) >90 mL/min   Comment: (NOTE)     The eGFR has been calculated using the CKD EPI equation.     This calculation has not been validated in all clinical situations.     eGFR's persistently <90 mL/min signify possible Chronic Kidney     Disease.   Anion gap 9  5 - 15  GLUCOSE, CAPILLARY     Status: Abnormal   Collection Time    05/01/14  4:48 PM      Result Value Ref Range   Glucose-Capillary 150 (*) 70 - 99 mg/dL  GLUCOSE, CAPILLARY     Status: Abnormal   Collection Time    05/01/14  9:50 PM      Result Value Ref Range   Glucose-Capillary 166 (*) 70 - 99 mg/dL  CBC     Status: Abnormal   Collection Time    05/02/14  4:08 AM      Result Value Ref Range   WBC 8.7  4.0 - 10.5 K/uL   RBC 3.51 (*) 3.87 - 5.11 MIL/uL   Hemoglobin 9.7 (*) 12.0 - 15.0 g/dL   HCT 30.9 (*) 36.0 - 46.0 %   MCV 88.0  78.0 - 100.0 fL   MCH 27.6  26.0 - 34.0 pg   MCHC 31.4  30.0 - 36.0 g/dL   RDW 15.4  11.5 - 15.5 %   Platelets 250  150 - 400 K/uL  GLUCOSE, CAPILLARY     Status: Abnormal   Collection Time    05/02/14  7:40 AM      Result Value Ref Range   Glucose-Capillary 129 (*) 70 - 99 mg/dL  GLUCOSE, CAPILLARY     Status: Abnormal   Collection Time    05/02/14 11:55 AM      Result Value Ref Range   Glucose-Capillary 303 (*) 70 - 99 mg/dL    Imaging: No results found.  Assessment: 1. NSTEMI s/p PCI of LCX with DES on 04/29/14 2. CAD 3. HTN 4. HL 5. Anxiety 6. Diabetes  7. Hyponatremia 8. Anemia of chronic disease  Plan:  She is stable. Can go home today on previous meds + plavix. Needs ardiac rehab.    Length of Stay:  LOS: 3 days  Genevive Bi  05/02/2014, 2:29 PM

## 2014-05-04 NOTE — Telephone Encounter (Signed)
I will close this encounter.  The pt was admitted to the hospital for NSTEMI.

## 2014-05-25 ENCOUNTER — Ambulatory Visit (INDEPENDENT_AMBULATORY_CARE_PROVIDER_SITE_OTHER): Payer: Commercial Managed Care - HMO | Admitting: Physician Assistant

## 2014-05-25 ENCOUNTER — Encounter: Payer: Self-pay | Admitting: Physician Assistant

## 2014-05-25 VITALS — BP 130/60 | HR 63 | Ht <= 58 in | Wt 136.0 lb

## 2014-05-25 DIAGNOSIS — I10 Essential (primary) hypertension: Secondary | ICD-10-CM

## 2014-05-25 DIAGNOSIS — R0602 Shortness of breath: Secondary | ICD-10-CM

## 2014-05-25 DIAGNOSIS — R079 Chest pain, unspecified: Secondary | ICD-10-CM

## 2014-05-25 DIAGNOSIS — I5032 Chronic diastolic (congestive) heart failure: Secondary | ICD-10-CM

## 2014-05-25 DIAGNOSIS — E785 Hyperlipidemia, unspecified: Secondary | ICD-10-CM

## 2014-05-25 DIAGNOSIS — I6523 Occlusion and stenosis of bilateral carotid arteries: Secondary | ICD-10-CM

## 2014-05-25 DIAGNOSIS — I2510 Atherosclerotic heart disease of native coronary artery without angina pectoris: Secondary | ICD-10-CM

## 2014-05-25 LAB — BASIC METABOLIC PANEL
BUN: 22 mg/dL (ref 6–23)
CHLORIDE: 100 meq/L (ref 96–112)
CO2: 25 mEq/L (ref 19–32)
Calcium: 8.7 mg/dL (ref 8.4–10.5)
Creatinine, Ser: 1.3 mg/dL — ABNORMAL HIGH (ref 0.4–1.2)
GFR: 41.09 mL/min — ABNORMAL LOW (ref >60.00)
Glucose, Bld: 102 mg/dL — ABNORMAL HIGH (ref 70–99)
Potassium: 4.1 mEq/L (ref 3.5–5.1)
Sodium: 136 mEq/L (ref 135–145)

## 2014-05-25 MED ORDER — LISINOPRIL 5 MG PO TABS: 5.0000 mg | ORAL_TABLET | Freq: Every day | ORAL | Status: DC

## 2014-05-25 MED ORDER — PANTOPRAZOLE SODIUM 40 MG PO TBEC: 40.0000 mg | DELAYED_RELEASE_TABLET | Freq: Every day | ORAL | Status: AC

## 2014-05-25 MED ORDER — ATORVASTATIN CALCIUM 20 MG PO TABS: 20.0000 mg | ORAL_TABLET | Freq: Every day | ORAL | Status: AC

## 2014-05-25 MED ORDER — CLONIDINE HCL 0.1 MG PO TABS: 0.1000 mg | ORAL_TABLET | Freq: Two times a day (BID) | ORAL | Status: DC

## 2014-05-25 MED ORDER — NITROGLYCERIN 0.4 MG SL SUBL: 0.4000 mg | SUBLINGUAL_TABLET | SUBLINGUAL | Status: AC | PRN

## 2014-05-25 MED ORDER — METOPROLOL SUCCINATE ER 25 MG PO TB24: 25.0000 mg | ORAL_TABLET | Freq: Every day | ORAL | Status: AC

## 2014-05-25 MED ORDER — FUROSEMIDE 40 MG PO TABS: 40.0000 mg | ORAL_TABLET | Freq: Every day | ORAL | Status: AC

## 2014-05-25 MED ORDER — CLOPIDOGREL BISULFATE 75 MG PO TABS: 75.0000 mg | ORAL_TABLET | Freq: Every day | ORAL | Status: DC

## 2014-05-25 NOTE — Progress Notes (Signed)
Cardiology Office Note   Date:  05/25/2014   ID:  Dana Blackwell, DOB Sep 14, 1927, MRN [id number]  PCP:  [name], MD  Cardiologist:  Dr. Sherren Mocha     History of Present Illness: Dana Blackwell is a 80 y.o. female with a hx of multivessel CAD s/p NSTEMI Rx DES x 2 to LAD in 11/2013, DM2, HTN, HL.  She was admitted in 02/2014 after a fall resulting in a large subarachnoid hemorrhage.  Antiplatelet Rx was held.  Event monitor after DC demonstrated 3.5 sec pauses.  Beta blocker Rx was adjusted and there were no further pauses.  She saw Dr. Sherren Mocha 03/27/14 one week after DC from SNF.  I saw her 04/22/14.  Patient had resumed Toprol and we asked her to stop this, again.  She was then admitted 9/9-9/12 with a non-STEMI. LHC demonstrated multivessel CAD with stable moderate to severe disease in the left circumflex and severe in-stent restenosis in the LAD. LAD was treated successfully with Cutting Balloon angioplasty using FFR guidance. Case was discussed with neurosurgery. Patient was placed back on Plavix in addition to aspirin.  Post PCI, she had ongoing chest pain with the lateral ST depression on ECG. She was taken back for repeat cardiac catheterization.  This demonstrated severe distal circumflex stenosis that was treated successfully with PCI using a DES.    She returns for FU. She is living back at home.  She is here by herself. She is getting ready to move to [place] to be closer to her children.  She is on chronic O2.  She notes chest tightness more when she is off her O2.  She sometimes takes NTG with relief.  She is chronically short of breath.  She is NYHA 3.  She denies orthopnea, PND.  She has noted some increased LE edema.  She took extra Lasix.  She denies syncope.  She tells me that her chest pain and dyspnea is fairly chronic and overall stable.     Studies:  - LHC (04/29/14):  Ostial LAD 50-60%, prox LAD stents patent, mid LAD stents 50% followed by 90%, prox OM1  75% (unchanged), dist AVCFX (into L PDA) 80-90%, heavy Ca2+ at origin of RCA w/o sig disease >> PCI:  Cutting balloon PTCA mid LAD ISR  - Re-look LHC (04/29/14):  Distal left main 30%, ostial LAD 60%, LAD stent patent, ostial diagonal 70%, ostial OM1 70%, distal circumflex 90% >> PCI:  2.5 x 16 mm Promus DES to the distal circumflex  - Echo (7/15):  EF 60-65%, Gr 2 DD, MAC, mild LAE, mild RAE, PASP 75 mmHg  - Echo (04/30/14): EF 50-55%, normal wall motion, grade 1 diastolic dysfunction, mild MR, severe LAE, PASP 60 mm Hg  - Carotid US (84/16):  RICA 6-06%; LICA 30-16% >>> FU 1 year   Recent Labs/Images: 04/13/2014: Pro B Natriuretic peptide (BNP) 351.0*  05/01/2014: Creatinine 1.30*; Potassium 4.5  05/02/2014: Hemoglobin 9.7*    Wt Readings from Last 3 Encounters:  05/25/14 136 lb (61.689 kg)  04/29/14 133 lb 13.1 oz (60.7 kg)  04/29/14 133 lb 13.1 oz (60.7 kg)     Past Medical History  Diagnosis Date  . Coronary artery disease     11/2012: s/p 3.0 x 28 mm Xience and 2.5 x 28 mm Xience stents to the LAD    . Hypertension   . Hyperlipidemia   . Stroke     hx of  . Obesity   . Heart  murmur   . Diabetes mellitus     type II  . Hypothyroidism   . Esophageal stricture   . Gastritis   . Nausea and vomiting   . Diarrhea   . Abdominal pain   . Esophageal reflux   . Depression   . Uterine cancer   . H/O: hysterectomy     Current Outpatient Prescriptions  Medication Sig Dispense Refill  . acetaminophen (TYLENOL) 650 MG CR tablet Take 1,300 mg by mouth at bedtime.       . ALPRAZolam (XANAX) 0.25 MG tablet Take 1 tablet (0.25 mg total) by mouth 3 (three) times daily as needed for anxiety.  30 tablet  0  . aspirin 81 MG tablet Take 81 mg by mouth daily.      Marland Kitchen atorvastatin (LIPITOR) 20 MG tablet Take 20 mg by mouth daily at 6 PM.       . beta carotene w/minerals (OCUVITE) tablet Take 1 tablet by mouth at bedtime.       . calcium carbonate (OS-CAL) 600 MG TABS tablet Take 600 mg by  mouth daily.      . cloNIDine (CATAPRES) 0.1 MG tablet Take 1 tablet (0.1 mg total) by mouth 2 (two) times daily.  60 tablet  11  . clopidogrel (PLAVIX) 75 MG tablet Take 1 tablet (75 mg total) by mouth daily.  30 tablet  11  . Cyanocobalamin (VITAMIN B-12 PO) Take 1 tablet by mouth daily.      Mariane Baumgarten Calcium (STOOL SOFTENER PO) Take 1 tablet by mouth 2 (two) times daily as needed (constipation).      . fish oil-omega-3 fatty acids 1000 MG capsule Take 1 g by mouth daily.       . folic acid-pyridoxine-cyancobalamin (FOLTX) 2.5-25-2 MG TABS Take 1 tablet by mouth daily.       . furosemide (LASIX) 40 MG tablet Take 1 tablet (40 mg total) by mouth daily.  30 tablet  4  . HYDROcodone-acetaminophen (NORCO/VICODIN) 5-325 MG per tablet Take 1 tablet by mouth every 6 (six) hours as needed for moderate pain.  30 tablet  0  . insulin glargine (LANTUS) 100 UNIT/ML injection Inject 0.1 mLs (10 Units total) into the skin at bedtime.  10 mL  11  . insulin lispro (HUMALOG) 100 UNIT/ML injection Inject 4-5 Units into the skin 3 (three) times daily before meals.      . isosorbide mononitrate (IMDUR) 60 MG 24 hr tablet Take 1 tablet (60 mg total) by mouth daily.  30 tablet  11  . levothyroxine (SYNTHROID, LEVOTHROID) 75 MCG tablet Take 75 mcg by mouth daily.        Marland Kitchen lisinopril (PRINIVIL,ZESTRIL) 5 MG tablet Take 1 tablet (5 mg total) by mouth daily.  30 tablet  11  . MAGNESIUM PO Take 1 tablet by mouth at bedtime.       . metoprolol succinate (TOPROL XL) 25 MG 24 hr tablet Take 1 tablet (25 mg total) by mouth daily.  30 tablet  11  . nitroGLYCERIN (NITROSTAT) 0.4 MG SL tablet Place 1 tablet (0.4 mg total) under the tongue every 5 (five) minutes as needed for chest pain.  25 tablet  3  . pantoprazole (PROTONIX) 40 MG tablet Take 40 mg by mouth daily.        . sertraline (ZOLOFT) 100 MG tablet Take 100 mg by mouth daily.       . traMADol (ULTRAM) 50 MG tablet Take 1-2 tablets (50-100 mg total)  by mouth every 6  (six) hours as needed (50mg  for mild pain, 75mg  for moderate pain, 100mg  for severe pain).  30 tablet  0   No current facility-administered medications for this visit.     Allergies:   Tetanus toxoid   Social History:  The patient  reports that she has never smoked. She has never used smokeless tobacco. She reports that she does not drink alcohol or use illicit drugs.   Family History:  The patient's family history includes Coronary artery disease in an other family member; Heart attack in her brother and sister; Heart attack (age of onset: 52) in her mother.   ROS:  Please see the history of present illness.     All other systems reviewed and negative.   PHYSICAL EXAM: VS:  BP 130/60  Pulse 63  Ht 4\' 10"  (1.473 m)  Wt 136 lb (61.689 kg)  BMI 28.43 kg/m2 Well nourished, well developed, in no acute distress HEENT: normal Neck: no JVDat 90 Cardiac:  normal S1, S2; RRR; no murmur Lungs:  clear to auscultation bilaterally, no wheezing, rhonchi or rales Abd: soft, nontender, no hepatomegaly Ext: no edema; right and left groins without hematoma or bruit  Skin: warm and dry Neuro:  CNs 2-12 intact, no focal abnormalities noted  EKG:  NSR, HR 63, normal axis, TWI 3, aVF, no change from prior tracing    ASSESSMENT AND PLAN:  1.  Atherosclerosis of native coronary artery of native heart without angina pectoris:  She is overall stable after recent NSTEMI treated with cutting balloon PTCA of the LAD for ISR and DES to the distal CFX.  She has chronic chest discomfort.  Overall, I suspect her chest pain and dyspnea is multifactorial and largely related to COPD.  ECG is unchanged.  Symptoms are stable.  BP is at goal and with her hx of prior fall with SAH, I am hesitant to push her antianginals further.  Weight is up a little and her edema was also increased recently.  I will check her BMET and BNP and adjust Lasix if needed.    2.  Chronic diastolic heart failure:  Volume overall stable.   But, with her chronic dyspnea and chest pain, I will repeat BMET and BNP today.  Adjust Lasix if BNP significantly elevated.    3.  Essential hypertension:  BP at goal.   4.  Hyperlipidemia:  Continue statin.    5.  Carotid Stenosis:  FU [place] due in 06/2014.  6.  Hx of Subarachnoid Hemorrhage:  Case was reviewed with NS by Dr. Burt Knack in the hospital and she was cleared to resume Plavix + ASA.     Disposition:  FU with Dr. Sherren Mocha as needed.  She will establish with a new Cardiologist when she gets to [place] at the end of this month.    Signed, Versie Starks, MHS 05/25/2014 2:40 PM    [facility] [address], [place], [place]  [address] Phone: [phone]; Fax: [phone]

## 2014-05-25 NOTE — Patient Instructions (Addendum)
LAB WORK TODAY; BMET, BNP  FOLLOW UP WITH DR. Burt Knack AS NEEDED; IF YOU DO NOT MOVE THEN PLEASE CALL THE OFFICE [phone] TO SCHEDULE AND APPT TO SEE DR. Burt Knack IN 2 MONTHS  REFILLS HAVE BEEN SENT IN FOR METOPROLOL, NTG, LISINOPRIL, LASIX, IMDUR, LIPITOR, CLONIDINE, PLAVIX, PROTONIX  THE REST OF YOUR MEDICATIONS WILL NEED TO BE FILLED WITH PRIMARY CARE

## 2014-05-26 LAB — BRAIN NATRIURETIC PEPTIDE: PRO B NATRI PEPTIDE: 684 pg/mL — AB (ref 0.0–100.0)

## 2014-05-27 ENCOUNTER — Encounter: Payer: Self-pay | Admitting: Gastroenterology

## 2014-05-27 ENCOUNTER — Telehealth: Payer: Self-pay | Admitting: *Deleted

## 2014-05-27 DIAGNOSIS — I5032 Chronic diastolic (congestive) heart failure: Secondary | ICD-10-CM

## 2014-05-27 NOTE — Telephone Encounter (Signed)
pt notified about lab results and to increase lasix to 40 mg bid x 3 days then go back to lasix 40 qd. BMET 10/16. Pt verbalized understanding to Plan of Care

## 2014-05-28 ENCOUNTER — Ambulatory Visit (INDEPENDENT_AMBULATORY_CARE_PROVIDER_SITE_OTHER): Payer: Commercial Managed Care - HMO | Admitting: Ophthalmology

## 2014-05-28 DIAGNOSIS — E11339 Type 2 diabetes mellitus with moderate nonproliferative diabetic retinopathy without macular edema: Secondary | ICD-10-CM

## 2014-05-28 DIAGNOSIS — H35033 Hypertensive retinopathy, bilateral: Secondary | ICD-10-CM

## 2014-05-28 DIAGNOSIS — E11311 Type 2 diabetes mellitus with unspecified diabetic retinopathy with macular edema: Secondary | ICD-10-CM

## 2014-05-28 DIAGNOSIS — H43813 Vitreous degeneration, bilateral: Secondary | ICD-10-CM

## 2014-05-28 DIAGNOSIS — I10 Essential (primary) hypertension: Secondary | ICD-10-CM

## 2014-05-28 DIAGNOSIS — E11321 Type 2 diabetes mellitus with mild nonproliferative diabetic retinopathy with macular edema: Secondary | ICD-10-CM

## 2014-05-28 DIAGNOSIS — H34232 Retinal artery branch occlusion, left eye: Secondary | ICD-10-CM

## 2014-06-05 ENCOUNTER — Other Ambulatory Visit (INDEPENDENT_AMBULATORY_CARE_PROVIDER_SITE_OTHER): Payer: Commercial Managed Care - HMO | Admitting: *Deleted

## 2014-06-05 DIAGNOSIS — I5032 Chronic diastolic (congestive) heart failure: Secondary | ICD-10-CM

## 2014-06-05 LAB — BASIC METABOLIC PANEL
BUN: 32 mg/dL — ABNORMAL HIGH (ref 6–23)
CO2: 24 meq/L (ref 19–32)
Calcium: 8.8 mg/dL (ref 8.4–10.5)
Chloride: 103 mEq/L (ref 96–112)
Creatinine, Ser: 1.5 mg/dL — ABNORMAL HIGH (ref 0.4–1.2)
GFR: 36.54 mL/min — AB (ref >60.00)
Glucose, Bld: 125 mg/dL — ABNORMAL HIGH (ref 70–99)
Potassium: 3.9 mEq/L (ref 3.5–5.1)
SODIUM: 137 meq/L (ref 135–145)

## 2014-06-08 ENCOUNTER — Telehealth: Payer: Self-pay | Admitting: *Deleted

## 2014-06-08 NOTE — Telephone Encounter (Signed)
pt notified about lab results with verbal understanding  

## 2014-06-26 ENCOUNTER — Emergency Department (HOSPITAL_COMMUNITY): Payer: Medicare HMO

## 2014-06-26 ENCOUNTER — Inpatient Hospital Stay (HOSPITAL_COMMUNITY)
Admission: EM | Admit: 2014-06-26 | Discharge: 2014-07-04 | DRG: 377 | Disposition: A | Discharge status: Home / Self Care | Payer: Medicare HMO | Attending: Internal Medicine | Admitting: Internal Medicine

## 2014-06-26 ENCOUNTER — Encounter (HOSPITAL_COMMUNITY): Payer: Self-pay | Admitting: Emergency Medicine

## 2014-06-26 ENCOUNTER — Ambulatory Visit (INDEPENDENT_AMBULATORY_CARE_PROVIDER_SITE_OTHER): Payer: Medicare HMO | Admitting: Ophthalmology

## 2014-06-26 DIAGNOSIS — F329 Major depressive disorder, single episode, unspecified: Secondary | ICD-10-CM | POA: Diagnosis present

## 2014-06-26 DIAGNOSIS — Z66 Do not resuscitate: Secondary | ICD-10-CM | POA: Diagnosis present

## 2014-06-26 DIAGNOSIS — K5900 Constipation, unspecified: Secondary | ICD-10-CM | POA: Diagnosis not present

## 2014-06-26 DIAGNOSIS — D509 Iron deficiency anemia, unspecified: Secondary | ICD-10-CM | POA: Diagnosis present

## 2014-06-26 DIAGNOSIS — Z7902 Long term (current) use of antithrombotics/antiplatelets: Secondary | ICD-10-CM

## 2014-06-26 DIAGNOSIS — Z8542 Personal history of malignant neoplasm of other parts of uterus: Secondary | ICD-10-CM

## 2014-06-26 DIAGNOSIS — I252 Old myocardial infarction: Secondary | ICD-10-CM

## 2014-06-26 DIAGNOSIS — I5033 Acute on chronic diastolic (congestive) heart failure: Secondary | ICD-10-CM | POA: Diagnosis present

## 2014-06-26 DIAGNOSIS — Z955 Presence of coronary angioplasty implant and graft: Secondary | ICD-10-CM | POA: Diagnosis not present

## 2014-06-26 DIAGNOSIS — N183 Chronic kidney disease, stage 3 unspecified: Secondary | ICD-10-CM | POA: Diagnosis present

## 2014-06-26 DIAGNOSIS — J449 Chronic obstructive pulmonary disease, unspecified: Secondary | ICD-10-CM | POA: Diagnosis present

## 2014-06-26 DIAGNOSIS — I272 Other secondary pulmonary hypertension: Secondary | ICD-10-CM | POA: Diagnosis present

## 2014-06-26 DIAGNOSIS — R7989 Other specified abnormal findings of blood chemistry: Secondary | ICD-10-CM

## 2014-06-26 DIAGNOSIS — Z7982 Long term (current) use of aspirin: Secondary | ICD-10-CM

## 2014-06-26 DIAGNOSIS — R195 Other fecal abnormalities: Secondary | ICD-10-CM

## 2014-06-26 DIAGNOSIS — E119 Type 2 diabetes mellitus without complications: Secondary | ICD-10-CM

## 2014-06-26 DIAGNOSIS — Z79899 Other long term (current) drug therapy: Secondary | ICD-10-CM | POA: Diagnosis not present

## 2014-06-26 DIAGNOSIS — K922 Gastrointestinal hemorrhage, unspecified: Secondary | ICD-10-CM | POA: Diagnosis present

## 2014-06-26 DIAGNOSIS — Z8673 Personal history of transient ischemic attack (TIA), and cerebral infarction without residual deficits: Secondary | ICD-10-CM | POA: Diagnosis not present

## 2014-06-26 DIAGNOSIS — I2510 Atherosclerotic heart disease of native coronary artery without angina pectoris: Secondary | ICD-10-CM | POA: Diagnosis present

## 2014-06-26 DIAGNOSIS — Z794 Long term (current) use of insulin: Secondary | ICD-10-CM | POA: Diagnosis not present

## 2014-06-26 DIAGNOSIS — Z9981 Dependence on supplemental oxygen: Secondary | ICD-10-CM

## 2014-06-26 DIAGNOSIS — R079 Chest pain, unspecified: Secondary | ICD-10-CM

## 2014-06-26 DIAGNOSIS — R945 Abnormal results of liver function studies: Secondary | ICD-10-CM

## 2014-06-26 DIAGNOSIS — I10 Essential (primary) hypertension: Secondary | ICD-10-CM | POA: Diagnosis present

## 2014-06-26 DIAGNOSIS — E785 Hyperlipidemia, unspecified: Secondary | ICD-10-CM | POA: Diagnosis present

## 2014-06-26 DIAGNOSIS — R531 Weakness: Secondary | ICD-10-CM | POA: Diagnosis present

## 2014-06-26 DIAGNOSIS — E669 Obesity, unspecified: Secondary | ICD-10-CM | POA: Diagnosis present

## 2014-06-26 DIAGNOSIS — D649 Anemia, unspecified: Secondary | ICD-10-CM

## 2014-06-26 DIAGNOSIS — I509 Heart failure, unspecified: Secondary | ICD-10-CM

## 2014-06-26 DIAGNOSIS — I129 Hypertensive chronic kidney disease with stage 1 through stage 4 chronic kidney disease, or unspecified chronic kidney disease: Secondary | ICD-10-CM | POA: Diagnosis present

## 2014-06-26 DIAGNOSIS — E039 Hypothyroidism, unspecified: Secondary | ICD-10-CM | POA: Diagnosis present

## 2014-06-26 DIAGNOSIS — K219 Gastro-esophageal reflux disease without esophagitis: Secondary | ICD-10-CM | POA: Diagnosis present

## 2014-06-26 HISTORY — DX: Chronic kidney disease, stage 3 (moderate): N18.3

## 2014-06-26 HISTORY — DX: Chronic kidney disease, stage 3 unspecified: N18.30

## 2014-06-26 LAB — COMPREHENSIVE METABOLIC PANEL
ALK PHOS: 84 U/L (ref 39–117)
ALT: 138 U/L — ABNORMAL HIGH (ref 0–35)
ALT: 141 U/L — ABNORMAL HIGH (ref 0–35)
ANION GAP: 14 (ref 5–15)
ANION GAP: 13 (ref 5–15)
AST: 170 U/L — ABNORMAL HIGH (ref 0–37)
AST: 156 U/L — ABNORMAL HIGH (ref 0–37)
Albumin: 2.9 g/dL — ABNORMAL LOW (ref 3.5–5.2)
Albumin: 3 g/dL — ABNORMAL LOW (ref 3.5–5.2)
Alkaline Phosphatase: 93 U/L (ref 39–117)
BILIRUBIN TOTAL: 0.6 mg/dL (ref 0.3–1.2)
BUN: 37 mg/dL — AB (ref 6–23)
BUN: 36 mg/dL — ABNORMAL HIGH (ref 6–23)
CALCIUM: 8.6 mg/dL (ref 8.4–10.5)
CHLORIDE: 101 meq/L (ref 96–112)
CO2: 24 mEq/L (ref 19–32)
CO2: 25 meq/L (ref 19–32)
Calcium: 8.4 mg/dL (ref 8.4–10.5)
Chloride: 101 mEq/L (ref 96–112)
Creatinine, Ser: 1.49 mg/dL — ABNORMAL HIGH (ref 0.50–1.10)
Creatinine, Ser: 1.61 mg/dL — ABNORMAL HIGH (ref 0.50–1.10)
GFR calc Af Amer: 33 mL/min — ABNORMAL LOW (ref >90)
GFR calc non Af Amer: 31 mL/min — ABNORMAL LOW (ref >90)
GFR, EST AFRICAN AMERICAN: 36 mL/min — AB (ref >90)
GFR, EST NON AFRICAN AMERICAN: 28 mL/min — AB (ref >90)
GLUCOSE: 129 mg/dL — AB (ref 70–99)
GLUCOSE: 244 mg/dL — AB (ref 70–99)
POTASSIUM: 3.6 meq/L — AB (ref 3.7–5.3)
Potassium: 4.7 mEq/L (ref 3.7–5.3)
Sodium: 139 mEq/L (ref 137–147)
Sodium: 139 mEq/L (ref 137–147)
TOTAL PROTEIN: 6.3 g/dL (ref 6.0–8.3)
Total Bilirubin: 0.5 mg/dL (ref 0.3–1.2)
Total Protein: 6.4 g/dL (ref 6.0–8.3)

## 2014-06-26 LAB — CBC WITH DIFFERENTIAL/PLATELET
BASOS PCT: 0 % (ref 0–1)
Basophils Absolute: 0 10*3/uL (ref 0.0–0.1)
EOS ABS: 0.1 10*3/uL (ref 0.0–0.7)
Eosinophils Relative: 1 % (ref 0–5)
HEMATOCRIT: 26.1 % — AB (ref 36.0–46.0)
HEMOGLOBIN: 8 g/dL — AB (ref 12.0–15.0)
LYMPHS ABS: 1.3 10*3/uL (ref 0.7–4.0)
LYMPHS PCT: 17 % (ref 12–46)
MCH: 25.8 pg — ABNORMAL LOW (ref 26.0–34.0)
MCHC: 30.7 g/dL (ref 30.0–36.0)
MCV: 84.2 fL (ref 78.0–100.0)
Monocytes Absolute: 1.1 10*3/uL — ABNORMAL HIGH (ref 0.1–1.0)
Monocytes Relative: 15 % — ABNORMAL HIGH (ref 3–12)
NEUTROS ABS: 5.1 10*3/uL (ref 1.7–7.7)
Neutrophils Relative %: 67 % (ref 43–77)
Platelets: 289 10*3/uL (ref 150–400)
RBC: 3.1 MIL/uL — ABNORMAL LOW (ref 3.87–5.11)
RDW: 17.9 % — ABNORMAL HIGH (ref 11.5–15.5)
WBC: 7.6 10*3/uL (ref 4.0–10.5)

## 2014-06-26 LAB — POC OCCULT BLOOD, ED: FECAL OCCULT BLD: POSITIVE — AB

## 2014-06-26 LAB — RETICULOCYTES
RBC.: 3.34 MIL/uL — ABNORMAL LOW (ref 3.87–5.11)
RETIC CT PCT: 2.1 % (ref 0.4–3.1)
Retic Count, Absolute: 70.1 10*3/uL (ref 19.0–186.0)

## 2014-06-26 LAB — PREPARE RBC (CROSSMATCH)

## 2014-06-26 LAB — PRO B NATRIURETIC PEPTIDE: PRO B NATRI PEPTIDE: 12174 pg/mL — AB (ref 0–450)

## 2014-06-26 LAB — I-STAT TROPONIN, ED: TROPONIN I, POC: 0.02 ng/mL (ref 0.00–0.08)

## 2014-06-26 LAB — GLUCOSE, CAPILLARY: GLUCOSE-CAPILLARY: 232 mg/dL — AB (ref 70–99)

## 2014-06-26 MED ORDER — ACETAMINOPHEN 650 MG RE SUPP: 650.0000 mg | Freq: Four times a day (QID) | RECTAL | Status: DC | PRN

## 2014-06-26 MED ORDER — FUROSEMIDE 10 MG/ML IJ SOLN
40.0000 mg | Freq: Two times a day (BID) | INTRAMUSCULAR | Status: DC
  Administered 2014-06-27 – 2014-06-28 (×3): 40 mg via INTRAVENOUS
  Filled 2014-06-26 (×5): qty 4

## 2014-06-26 MED ORDER — POTASSIUM CHLORIDE CRYS ER 20 MEQ PO TBCR
40.0000 meq | EXTENDED_RELEASE_TABLET | Freq: Once | ORAL | Status: AC
  Administered 2014-06-26: 40 meq via ORAL
  Filled 2014-06-26: qty 2

## 2014-06-26 MED ORDER — LEVOTHYROXINE SODIUM 75 MCG PO TABS
75.0000 ug | ORAL_TABLET | Freq: Every day | ORAL | Status: DC
  Administered 2014-06-27 – 2014-07-04 (×8): 75 ug via ORAL
  Filled 2014-06-26 (×9): qty 1

## 2014-06-26 MED ORDER — SERTRALINE HCL 100 MG PO TABS
100.0000 mg | ORAL_TABLET | Freq: Every evening | ORAL | Status: DC
  Administered 2014-06-26 – 2014-07-03 (×8): 100 mg via ORAL
  Filled 2014-06-26 (×9): qty 1

## 2014-06-26 MED ORDER — INSULIN GLARGINE 100 UNIT/ML ~~LOC~~ SOLN
10.0000 [IU] | Freq: Every day | SUBCUTANEOUS | Status: DC
  Administered 2014-06-26 – 2014-07-03 (×7): 10 [IU] via SUBCUTANEOUS
  Filled 2014-06-26 (×9): qty 0.1

## 2014-06-26 MED ORDER — CLONIDINE HCL 0.1 MG PO TABS
0.1000 mg | ORAL_TABLET | Freq: Two times a day (BID) | ORAL | Status: DC
  Administered 2014-06-26 – 2014-07-04 (×16): 0.1 mg via ORAL
  Filled 2014-06-26 (×17): qty 1

## 2014-06-26 MED ORDER — ASPIRIN EC 81 MG PO TBEC
81.0000 mg | DELAYED_RELEASE_TABLET | Freq: Every day | ORAL | Status: DC
  Administered 2014-06-27 – 2014-07-04 (×8): 81 mg via ORAL
  Filled 2014-06-26 (×8): qty 1

## 2014-06-26 MED ORDER — METOPROLOL SUCCINATE ER 25 MG PO TB24
25.0000 mg | ORAL_TABLET | Freq: Every day | ORAL | Status: DC
  Administered 2014-06-27 – 2014-07-04 (×8): 25 mg via ORAL
  Filled 2014-06-26 (×8): qty 1

## 2014-06-26 MED ORDER — PANTOPRAZOLE SODIUM 40 MG IV SOLR
40.0000 mg | Freq: Once | INTRAVENOUS | Status: AC
  Administered 2014-06-26: 40 mg via INTRAVENOUS
  Filled 2014-06-26: qty 40

## 2014-06-26 MED ORDER — NITROGLYCERIN 0.4 MG SL SUBL: 0.4000 mg | SUBLINGUAL_TABLET | SUBLINGUAL | Status: DC | PRN

## 2014-06-26 MED ORDER — PANTOPRAZOLE SODIUM 40 MG IV SOLR
40.0000 mg | Freq: Two times a day (BID) | INTRAVENOUS | Status: DC
  Administered 2014-06-26 – 2014-07-04 (×16): 40 mg via INTRAVENOUS
  Filled 2014-06-26 (×17): qty 40

## 2014-06-26 MED ORDER — ACETAMINOPHEN 325 MG PO TABS: 650.0000 mg | ORAL_TABLET | Freq: Four times a day (QID) | ORAL | Status: DC | PRN

## 2014-06-26 MED ORDER — FUROSEMIDE 10 MG/ML IJ SOLN
60.0000 mg | Freq: Once | INTRAMUSCULAR | Status: AC
  Administered 2014-06-26: 60 mg via INTRAVENOUS
  Filled 2014-06-26: qty 6

## 2014-06-26 MED ORDER — INSULIN ASPART 100 UNIT/ML ~~LOC~~ SOLN
0.0000 [IU] | Freq: Three times a day (TID) | SUBCUTANEOUS | Status: DC
  Administered 2014-06-27: 3 [IU] via SUBCUTANEOUS
  Administered 2014-06-27: 2 [IU] via SUBCUTANEOUS
  Administered 2014-06-28: 5 [IU] via SUBCUTANEOUS

## 2014-06-26 MED ORDER — SODIUM CHLORIDE 0.9 % IV SOLN
Freq: Once | INTRAVENOUS | Status: AC
  Administered 2014-06-26: 10 mL/h via INTRAVENOUS

## 2014-06-26 MED ORDER — ALPRAZOLAM 0.25 MG PO TABS
0.2500 mg | ORAL_TABLET | Freq: Once | ORAL | Status: AC
  Administered 2014-06-26: 0.25 mg via ORAL
  Filled 2014-06-26: qty 1

## 2014-06-26 MED ORDER — TRAMADOL HCL 50 MG PO TABS
50.0000 mg | ORAL_TABLET | Freq: Four times a day (QID) | ORAL | Status: DC | PRN
  Administered 2014-06-26 – 2014-07-02 (×6): 50 mg via ORAL
  Filled 2014-06-26 (×6): qty 1

## 2014-06-26 MED ORDER — CLOPIDOGREL BISULFATE 75 MG PO TABS
75.0000 mg | ORAL_TABLET | Freq: Every day | ORAL | Status: DC
  Administered 2014-06-26: 75 mg via ORAL
  Filled 2014-06-26 (×2): qty 1

## 2014-06-26 MED ORDER — ONDANSETRON HCL 4 MG PO TABS: 4.0000 mg | ORAL_TABLET | Freq: Four times a day (QID) | ORAL | Status: DC | PRN

## 2014-06-26 MED ORDER — ONDANSETRON HCL 4 MG/2ML IJ SOLN: 4.0000 mg | Freq: Four times a day (QID) | INTRAMUSCULAR | Status: DC | PRN

## 2014-06-26 MED ORDER — ISOSORBIDE MONONITRATE ER 60 MG PO TB24
60.0000 mg | ORAL_TABLET | Freq: Every day | ORAL | Status: DC
  Administered 2014-06-27 – 2014-07-04 (×8): 60 mg via ORAL
  Filled 2014-06-26 (×8): qty 1

## 2014-06-26 MED ORDER — LISINOPRIL 5 MG PO TABS
5.0000 mg | ORAL_TABLET | Freq: Every day | ORAL | Status: DC
  Administered 2014-06-26 – 2014-06-27 (×2): 5 mg via ORAL
  Filled 2014-06-26 (×2): qty 1

## 2014-06-26 MED ORDER — ASPIRIN 81 MG PO TABS: 81.0000 mg | ORAL_TABLET | Freq: Every day | ORAL | Status: DC

## 2014-06-26 MED ORDER — HYDROCODONE-ACETAMINOPHEN 5-325 MG PO TABS
1.0000 | ORAL_TABLET | Freq: Four times a day (QID) | ORAL | Status: DC | PRN
  Administered 2014-06-26: 1 via ORAL
  Filled 2014-06-26: qty 1

## 2014-06-26 MED ORDER — ATORVASTATIN CALCIUM 20 MG PO TABS
20.0000 mg | ORAL_TABLET | Freq: Every day | ORAL | Status: DC
  Filled 2014-06-26: qty 1

## 2014-06-26 NOTE — ED Provider Notes (Signed)
CSN: [id number]     Arrival date & time 06/26/14  1209 History   First MD Initiated Contact with Patient 06/26/14 1315     Chief Complaint  Patient presents with  . Weakness  . Abdominal Pain     (Consider location/radiation/quality/duration/timing/severity/associated sxs/prior Treatment) HPI Comments: 80 yo female with fatigue and intermittent SOB.  She saw her doctor yesterday and was found to be anemic.  Sent to the ED for further evaluation.    Patient is a 80 y.o. female presenting with weakness.  Weakness This is a new problem. The problem occurs constantly. The problem has been gradually worsening. Associated symptoms include shortness of breath. Pertinent negatives include no chest pain. Associated symptoms comments: Fatigue. Weight gain. . Nothing aggravates the symptoms. Nothing relieves the symptoms.    Past Medical History  Diagnosis Date  . Coronary artery disease     11/2012: s/p 3.0 x 28 mm Xience and 2.5 x 28 mm Xience stents to the LAD    . Hypertension   . Hyperlipidemia   . Stroke     hx of  . Obesity   . Heart murmur   . Diabetes mellitus     type II  . Hypothyroidism   . Esophageal stricture   . Gastritis   . Nausea and vomiting   . Diarrhea   . Abdominal pain   . Esophageal reflux   . Depression   . Uterine cancer   . H/O: hysterectomy    Past Surgical History  Procedure Laterality Date  . Bladder surgery      tacking  . Cervical disc surgery    . Cholecystectomy    . Total abdominal hysterectomy     Family History  Problem Relation Age of Onset  . Heart attack Mother 30    died  . Coronary artery disease      family hx on  father side  . Heart attack Brother   . Heart attack Sister    History  Substance Use Topics  . Smoking status: Never Smoker   . Smokeless tobacco: Never Used  . Alcohol Use: No   OB History    No data available     Review of Systems  Respiratory: Positive for shortness of breath.   Cardiovascular:  Negative for chest pain.  Neurological: Positive for weakness.  All other systems reviewed and are negative.     Allergies  Tetanus toxoid  Home Medications   Prior to Admission medications   Medication Sig Start Date End Date Taking? Authorizing Provider  acetaminophen (TYLENOL) 650 MG CR tablet Take 1,300 mg by mouth at bedtime.     Historical Provider, MD  aspirin 81 MG tablet Take 81 mg by mouth daily.    Historical Provider, MD  atorvastatin (LIPITOR) 20 MG tablet Take 1 tablet (20 mg total) by mouth daily at 6 PM. 05/25/14   Liliane Shi, PA-C  beta carotene w/minerals (OCUVITE) tablet Take 1 tablet by mouth at bedtime.     Historical Provider, MD  calcium carbonate (OS-CAL) 600 MG TABS tablet Take 600 mg by mouth daily.    Historical Provider, MD  cloNIDine (CATAPRES) 0.1 MG tablet Take 1 tablet (0.1 mg total) by mouth 2 (two) times daily. 05/25/14   Liliane Shi, PA-C  clopidogrel (PLAVIX) 75 MG tablet Take 1 tablet (75 mg total) by mouth daily. 05/25/14   Liliane Shi, PA-C  Cyanocobalamin (VITAMIN B-12 PO) Take 1 tablet by mouth daily.  Historical Provider, MD  Docusate Calcium (STOOL SOFTENER PO) Take 1 tablet by mouth 2 (two) times daily as needed (constipation).    Historical Provider, MD  fish oil-omega-3 fatty acids 1000 MG capsule Take 1 g by mouth daily.     Historical Provider, MD  folic acid-pyridoxine-cyancobalamin (FOLTX) 2.5-25-2 MG TABS Take 1 tablet by mouth daily.     Historical Provider, MD  furosemide (LASIX) 40 MG tablet Take 1 tablet (40 mg total) by mouth daily. 05/25/14   Liliane Shi, PA-C  HYDROcodone-acetaminophen (NORCO/VICODIN) 5-325 MG per tablet Take 1 tablet by mouth every 6 (six) hours as needed for moderate pain. 02/27/14   Sheila Oats, MD  insulin glargine (LANTUS) 100 UNIT/ML injection Inject 0.1 mLs (10 Units total) into the skin at bedtime. 11/28/13   Rhonda G Barrett, PA-C  insulin lispro (HUMALOG) 100 UNIT/ML injection Inject 4-5  Units into the skin 3 (three) times daily before meals.    Historical Provider, MD  isosorbide mononitrate (IMDUR) 60 MG 24 hr tablet Take 1 tablet (60 mg total) by mouth daily. 04/22/14   Liliane Shi, PA-C  levothyroxine (SYNTHROID, LEVOTHROID) 75 MCG tablet Take 75 mcg by mouth daily.      Historical Provider, MD  lisinopril (PRINIVIL,ZESTRIL) 5 MG tablet Take 1 tablet (5 mg total) by mouth daily. 05/25/14   Liliane Shi, PA-C  MAGNESIUM PO Take 1 tablet by mouth at bedtime.     Historical Provider, MD  metoprolol succinate (TOPROL XL) 25 MG 24 hr tablet Take 1 tablet (25 mg total) by mouth daily. 05/25/14   Liliane Shi, PA-C  nitroGLYCERIN (NITROSTAT) 0.4 MG SL tablet Place 1 tablet (0.4 mg total) under the tongue every 5 (five) minutes as needed for chest pain. 05/25/14   Liliane Shi, PA-C  pantoprazole (PROTONIX) 40 MG tablet Take 1 tablet (40 mg total) by mouth daily. 05/25/14   Liliane Shi, PA-C  sertraline (ZOLOFT) 100 MG tablet Take 100 mg by mouth daily.     Historical Provider, MD  traMADol (ULTRAM) 50 MG tablet Take 1-2 tablets (50-100 mg total) by mouth every 6 (six) hours as needed (50mg  for mild pain, 75mg  for moderate pain, 100mg  for severe pain). 02/27/14   Adeline C Viyuoh, MD   BP 162/63 mmHg  Pulse 55  Temp(Src) 98.1 F (36.7 C) (Oral)  Resp 19  SpO2 98% Physical Exam  Constitutional: She is oriented to person, place, and time. She appears well-developed and well-nourished. No distress.  HENT:  Head: Normocephalic and atraumatic.  Mouth/Throat: Oropharynx is clear and moist.  Eyes: Conjunctivae are normal. Pupils are equal, round, and reactive to light. No scleral icterus.  Neck: Neck supple.  Cardiovascular: Normal rate, regular rhythm, normal heart sounds and intact distal pulses.   No murmur heard. Pulmonary/Chest: Effort normal. No stridor. No respiratory distress. She has decreased breath sounds (right base). She has rales (left base).  Abdominal: Soft.  Bowel sounds are normal. She exhibits no distension. There is no tenderness. There is no rebound and no guarding.  Genitourinary: Rectal exam shows no external hemorrhoid, no mass and no tenderness. Guaiac positive stool (brown stool).  Musculoskeletal: Normal range of motion.  Neurological: She is alert and oriented to person, place, and time.  Skin: Skin is warm and dry. No rash noted.  Psychiatric: She has a normal mood and affect. Her behavior is normal.  Nursing note and vitals reviewed.   ED Course  Procedures (including critical  care time) Labs Review Labs Reviewed  CBC WITH DIFFERENTIAL - Abnormal; Notable for the following:    RBC 3.10 (*)    Hemoglobin 8.0 (*)    HCT 26.1 (*)    MCH 25.8 (*)    RDW 17.9 (*)    Monocytes Relative 15 (*)    Monocytes Absolute 1.1 (*)    All other components within normal limits  COMPREHENSIVE METABOLIC PANEL - Abnormal; Notable for the following:    Potassium 3.6 (*)    Glucose, Bld 129 (*)    BUN 37 (*)    Creatinine, Ser 1.49 (*)    Albumin 2.9 (*)    AST 170 (*)    ALT 138 (*)    GFR calc non Af Amer 31 (*)    GFR calc Af Amer 36 (*)    All other components within normal limits  PRO B NATRIURETIC PEPTIDE - Abnormal; Notable for the following:    Pro B Natriuretic peptide (BNP) 12174.0 (*)    All other components within normal limits  POC OCCULT BLOOD, ED - Abnormal; Notable for the following:    Fecal Occult Bld POSITIVE (*)    All other components within normal limits  I-STAT TROPOININ, ED  TYPE AND SCREEN    Imaging Review Dg Chest 2 View  06/26/2014   CLINICAL DATA:  Shortness of breath, chest pain  EXAM: CHEST  2 VIEW  COMPARISON:  04/29/2014  FINDINGS: New moderate-sized right and small left pleural effusions are noted, obscuring the lung bases. Heart size is at upper limits of normal. Bilateral hilar prominence with central tapering may suggest pulmonary arterial hypertension. Leftward curvature of the spine centered at  T12 is again noted. Cholecystectomy clips are noted. A few interstitial Kerley B-lines are noted.  IMPRESSION: New moderate right and small left pleural effusions obscuring the lung bases. This could obscure underlying pneumonia and/or atelectasis. Peripheral interstitial Kerley B-lines could indicate superimposed early interstitial edema.   Electronically Signed   By: Conchita Paris M.D.   On: 06/26/2014 14:04  All radiology studies independently viewed by me.      EKG Interpretation   Date/Time:  Friday June 26 2014 13:05:42 EST Ventricular Rate:  61 PR Interval:  165 QRS Duration: 96 QT Interval:  478 QTC Calculation: 481 R Axis:   -17 Text Interpretation:  Sinus rhythm Borderline left axis deviation  Borderline T wave abnormalities compared to priors, t waves now slightly  flat Confirmed by [facility]  MD, TREY ([id number]) on 06/26/2014 1:57:57 PM      MDM   Final diagnoses:  Chest pain  Acute on chronic congestive heart failure, unspecified congestive heart failure type  Anemia, unspecified anemia type    80 yo female with hx of CHF presenting with fatigue.  Found to be anemic.  Hg has dropped 3 points in about 6 weeks.  She also appears to be in somewhat decompensated heart failure (significant peripheral edema, worsening SOB.)  Likely exacerbated by anemia.  Discussed with Dr. Broadus Shawnika Pepin who will admit.      Artis Delay, MD 06/26/14 [phone]

## 2014-06-26 NOTE — ED Notes (Signed)
Was seen at dr office yesterday and had labs drawn and was called today and told she is anemic and that she has liver issues and she feels weak.  Pt is onhome o2 at 2 lnc

## 2014-06-26 NOTE — H&P (Addendum)
[facility] History and Physical  Dana Blackwell YKZ:[id number] DOB: Nov 16, 1927 DOA: 06/26/2014  Referring physician: EDP PCP: [name], MD   Chief Complaint: weakness, dyspnea  HPI: Dana Blackwell is a 80 y.o. female with PMH of CAD, recent NSTEMI s/p PTCA of the LAD and subsequent DES stent to left circumflex in 9/15, DM, HTN, Dyslipidemia, COPD/Pulm HTN on home O2, chronic diastolic CHF, presents to the ER with the above complaints. She had blood work done yesterday at [facility] office and was called today and told that she was anemic and had abnormal LFTs and asked to come to the ER. She also complaints of vague abd discomfort, nausea off and on for 4 weeks, in addition she also reports progressive dyspnea on exertion for days to weeks and increased lower extremity swelling for 1 week. She denies any overt bleeding, no hematemesis or melena. No NSAID use In ER, noted to have Hb of 8, down from 11 few months ago, trace heme positive in ER and CXR with b/l pleural effusions and BNP of 121K.  Review of Systems: positives bolded Constitutional:  No weight loss, night sweats, Fevers, chills, fatigue.  HEENT:  No headaches, Difficulty swallowing,Tooth/dental problems,Sore throat,  No sneezing, itching, ear ache, nasal congestion, post nasal drip,  Cardio-vascular:  No chest pain, Orthopnea, PND, swelling in lower extremities, anasarca, dizziness, palpitations  GI:  No heartburn, indigestion, abdominal pain, nausea, vomiting, diarrhea, change in bowel habits, loss of appetite  Resp:  shortness of breath with exertion or at rest. No excess mucus, no productive cough, No non-productive cough, No coughing up of blood.No change in color of mucus.No wheezing.No chest wall deformity  Skin:  no rash or lesions.  GU:  no dysuria, change in color of urine, no urgency or frequency. No flank pain.  Musculoskeletal:  No joint pain or swelling. No decreased range of motion. No  back pain.  Psych:  No change in mood or affect. No depression or anxiety. No memory loss.   Past Medical History  Diagnosis Date  . Coronary artery disease     11/2012: s/p 3.0 x 28 mm Xience and 2.5 x 28 mm Xience stents to the LAD    . Hypertension   . Hyperlipidemia   . Stroke     hx of  . Obesity   . Heart murmur   . Diabetes mellitus     type II  . Hypothyroidism   . Esophageal stricture   . Gastritis   . Nausea and vomiting   . Diarrhea   . Abdominal pain   . Esophageal reflux   . Depression   . Uterine cancer   . H/O: hysterectomy    Past Surgical History  Procedure Laterality Date  . Bladder surgery      tacking  . Cervical disc surgery    . Cholecystectomy    . Total abdominal hysterectomy     Social History:  reports that she has never smoked. She has never used smokeless tobacco. She reports that she does not drink alcohol or use illicit drugs. Lives at home, independent in ADLs  Allergies  Allergen Reactions  . Tetanus Toxoid Other (See Comments)    unknown    Family History  Problem Relation Age of Onset  . Heart attack Mother 29    died  . Coronary artery disease      family hx on  father side  . Heart attack Brother   . Heart attack Sister  Prior to Admission medications   Medication Sig Start Date End Date Taking? Authorizing Provider  aspirin 81 MG tablet Take 81 mg by mouth daily.   Yes Historical Provider, MD  atorvastatin (LIPITOR) 20 MG tablet Take 1 tablet (20 mg total) by mouth daily at 6 PM. 05/25/14  Yes Liliane Shi, PA-C  beta carotene w/minerals (OCUVITE) tablet Take 1 tablet by mouth at bedtime.    Yes Historical Provider, MD  calcium carbonate (OS-CAL) 600 MG TABS tablet Take 600 mg by mouth daily.   Yes Historical Provider, MD  cloNIDine (CATAPRES) 0.1 MG tablet Take 1 tablet (0.1 mg total) by mouth 2 (two) times daily. 05/25/14  Yes Scott T Kathlen Mody, PA-C  Cyanocobalamin (VITAMIN B-12 PO) Take 1 tablet by mouth daily.    Yes Historical Provider, MD  Docusate Calcium (STOOL SOFTENER PO) Take 1 tablet by mouth 2 (two) times daily as needed (constipation).   Yes Historical Provider, MD  folic acid-pyridoxine-cyancobalamin (FOLTX) 2.5-25-2 MG TABS Take 1 tablet by mouth daily.    Yes Historical Provider, MD  furosemide (LASIX) 40 MG tablet Take 1 tablet (40 mg total) by mouth daily. 05/25/14  Yes Liliane Shi, PA-C  HYDROcodone-acetaminophen (NORCO/VICODIN) 5-325 MG per tablet Take 1 tablet by mouth every 6 (six) hours as needed for moderate pain. 02/27/14  Yes Adeline C Viyuoh, MD  insulin glargine (LANTUS) 100 UNIT/ML injection Inject 0.1 mLs (10 Units total) into the skin at bedtime. 11/28/13  Yes Rhonda G Barrett, PA-C  insulin lispro (HUMALOG) 100 UNIT/ML injection Inject 4-5 Units into the skin 3 (three) times daily before meals.   Yes Historical Provider, MD  isosorbide mononitrate (IMDUR) 60 MG 24 hr tablet Take 1 tablet (60 mg total) by mouth daily. 04/22/14  Yes Scott Joylene Draft, PA-C  levothyroxine (SYNTHROID, LEVOTHROID) 75 MCG tablet Take 75 mcg by mouth daily.     Yes Historical Provider, MD  lisinopril (PRINIVIL,ZESTRIL) 5 MG tablet Take 1 tablet (5 mg total) by mouth daily. 05/25/14  Yes Liliane Shi, PA-C  metoprolol succinate (TOPROL XL) 25 MG 24 hr tablet Take 1 tablet (25 mg total) by mouth daily. 05/25/14  Yes Liliane Shi, PA-C  nitroGLYCERIN (NITROSTAT) 0.4 MG SL tablet Place 1 tablet (0.4 mg total) under the tongue every 5 (five) minutes as needed for chest pain. 05/25/14  Yes Liliane Shi, PA-C  Omega 3-6-9 CAPS Take 1 capsule by mouth daily.   Yes Historical Provider, MD  pantoprazole (PROTONIX) 40 MG tablet Take 1 tablet (40 mg total) by mouth daily. Patient taking differently: Take 40 mg by mouth 2 (two) times daily.  05/25/14  Yes Liliane Shi, PA-C  sertraline (ZOLOFT) 100 MG tablet Take 100 mg by mouth every evening.    Yes Historical Provider, MD  traMADol (ULTRAM) 50 MG tablet Take 1-2  tablets (50-100 mg total) by mouth every 6 (six) hours as needed (50mg  for mild pain, 75mg  for moderate pain, 100mg  for severe pain). 02/27/14  Yes Adeline Saralyn Pilar, MD  acetaminophen (TYLENOL) 650 MG CR tablet Take 1,300 mg by mouth at bedtime.     Historical Provider, MD  clopidogrel (PLAVIX) 75 MG tablet Take 1 tablet (75 mg total) by mouth daily. 05/25/14   Liliane Shi, PA-C  fish oil-omega-3 fatty acids 1000 MG capsule Take 1 g by mouth daily.     Historical Provider, MD  MAGNESIUM PO Take 1 tablet by mouth at bedtime.     Historical  Provider, MD   Physical Exam: Filed Vitals:   06/26/14 1430 06/26/14 1500 06/26/14 1530 06/26/14 1600  BP: 162/63 155/76 153/67 166/64  Pulse: 55 57 58 58  Temp:      TempSrc:      Resp: 19 23 20 15   SpO2: 98% 98% 98% 98%    Wt Readings from Last 3 Encounters:  05/25/14 61.689 kg (136 lb)  04/29/14 60.7 kg (133 lb 13.1 oz)  04/22/14 61.236 kg (135 lb)    General:  Appears calm and comfortable, no distress Eyes: PERRL, normal lids, irises & conjunctiva ENT: grossly normal lips & tongue Neck: no LAD, masses or thyromegaly Cardiovascular: RRR, no m/r/g. No LE edema. Respiratory: diminished BS at bases, faint crackles at L base, no w/r/r. Normal respiratory effort. Abdomen: soft, nt,nd, BS present Skin: no rash or induration seen on limited exam Musculoskeletal: grossly normal tone BUE/BLE, 1-2plus edema Psychiatric: grossly normal mood and affect, speech fluent and appropriate Neurologic: grossly non-focal.          Labs on Admission:  Basic Metabolic Panel:  Recent Labs Lab 06/26/14 1255  NA 139  K 3.6*  CL 101  CO2 24  GLUCOSE 129*  BUN 37*  CREATININE 1.49*  CALCIUM 8.4   Liver Function Tests:  Recent Labs Lab 06/26/14 1255  AST 170*  ALT 138*  ALKPHOS 84  BILITOT 0.6  PROT 6.3  ALBUMIN 2.9*   No results for input(s): LIPASE, AMYLASE in the last 168 hours. No results for input(s): AMMONIA in the last 168  hours. CBC:  Recent Labs Lab 06/26/14 1255  WBC 7.6  NEUTROABS 5.1  HGB 8.0*  HCT 26.1*  MCV 84.2  PLT 289   Cardiac Enzymes: No results for input(s): CKTOTAL, CKMB, CKMBINDEX, TROPONINI in the last 168 hours.  BNP (last 3 results)  Recent Labs  04/13/14 1131 05/25/14 1550 06/26/14 1330  PROBNP 351.0* 684.0* 12174.0*   CBG: No results for input(s): GLUCAP in the last 168 hours.  Radiological Exams on Admission: Dg Chest 2 View  06/26/2014   CLINICAL DATA:  Shortness of breath, chest pain  EXAM: CHEST  2 VIEW  COMPARISON:  04/29/2014  FINDINGS: New moderate-sized right and small left pleural effusions are noted, obscuring the lung bases. Heart size is at upper limits of normal. Bilateral hilar prominence with central tapering may suggest pulmonary arterial hypertension. Leftward curvature of the spine centered at T12 is again noted. Cholecystectomy clips are noted. A few interstitial Kerley B-lines are noted.  IMPRESSION: New moderate right and small left pleural effusions obscuring the lung bases. This could obscure underlying pneumonia and/or atelectasis. Peripheral interstitial Kerley B-lines could indicate superimposed early interstitial edema.   Electronically Signed   By: Conchita Paris M.D.   On: 06/26/2014 14:04    EKG: Independently reviewed. NSR, non specific T wave changes  Assessment/Plan Principal Problem:  1.  Anemia/normocytic  -trace heme positive stools -check anemia panel -reports colonoscopy >10-15 years ago, cannot recall provider, EUS per Dr.Outlaw in 2011 -IV PPI -transfuse 1 unit PRBC  -[facility] consulted, Dr.Edwards to see in am  2. Acute on chronic diastolic CHF -likely in part due to anemia -IV lasix today 40mg  q12 -follow I/Os, weights -last ECHo with EF 50-55% in 9/15 -continue Metoprolol and low dose ACE  3. Elevated LFTs -denies any ETOH use -normal LFTs in 2011, none checked since, so recent baseline unknown -stop statin, check RUQ  [place], Cmet in am -could be due to  passive hepatic congestion from CHF  4. CAD, recent NSTEMI and PTCI of LAD and DES to circumflex -continue ASA/plavix/metoprolol -hold statin   6. Diabetes mellitus -continue lantus, SSI  7. Essential hypertension -cotninue ACE, imdur and metoprolol  8. COPD (chronic obstructive pulmonary disease)/  Pulmonary HTN -stable  Code Status: DNR after much discussion DVT Prophylaxis: SCDS Family Communication: none at bedside Disposition Plan: home when stable  Time spent: 60min  Meril Dray [facility] Pager [phone]

## 2014-06-26 NOTE — ED Notes (Signed)
Dr. Woffard at bedside. 

## 2014-06-26 NOTE — Plan of Care (Signed)
Problem: Phase I Progression Outcomes Goal: Dyspnea controlled at rest (HF) Outcome: Progressing Goal: Pain controlled with appropriate interventions Outcome: Progressing Goal: EF % per last Echo/documented,Core Reminder form on chart Outcome: Completed/Met Date Met:  06/26/14 EF Performed 04/30/2014 EF% result - 50-55% Goal: Up in chair, BRP Outcome: Completed/Met Date Met:  06/26/14

## 2014-06-26 NOTE — ED Notes (Signed)
Patient transported to X-ray 

## 2014-06-26 NOTE — Progress Notes (Signed)
Patient blood pressure elevated 181/74. Notified on-call hospitalist. Orders given for PRN hydralazine. Will give as ordered and continue to monitor patient to end of shift.

## 2014-06-27 ENCOUNTER — Inpatient Hospital Stay (HOSPITAL_COMMUNITY): Payer: Medicare HMO

## 2014-06-27 DIAGNOSIS — I270 Primary pulmonary hypertension: Secondary | ICD-10-CM

## 2014-06-27 DIAGNOSIS — N189 Chronic kidney disease, unspecified: Secondary | ICD-10-CM

## 2014-06-27 DIAGNOSIS — I10 Essential (primary) hypertension: Secondary | ICD-10-CM

## 2014-06-27 DIAGNOSIS — D509 Iron deficiency anemia, unspecified: Principal | ICD-10-CM

## 2014-06-27 DIAGNOSIS — N179 Acute kidney failure, unspecified: Secondary | ICD-10-CM

## 2014-06-27 LAB — HEMOGLOBIN A1C
HEMOGLOBIN A1C: 7.5 % — AB (ref <5.7)
MEAN PLASMA GLUCOSE: 169 mg/dL — AB (ref <117)

## 2014-06-27 LAB — TYPE AND SCREEN
ABO/RH(D): A POS
Antibody Screen: NEGATIVE
Unit division: 0

## 2014-06-27 LAB — CBC
HCT: 30.7 % — ABNORMAL LOW (ref 36.0–46.0)
Hemoglobin: 9.6 g/dL — ABNORMAL LOW (ref 12.0–15.0)
MCH: 26.4 pg (ref 26.0–34.0)
MCHC: 31.3 g/dL (ref 30.0–36.0)
MCV: 84.6 fL (ref 78.0–100.0)
PLATELETS: 253 10*3/uL (ref 150–400)
RBC: 3.63 MIL/uL — AB (ref 3.87–5.11)
RDW: 17.4 % — ABNORMAL HIGH (ref 11.5–15.5)
WBC: 8.7 10*3/uL (ref 4.0–10.5)

## 2014-06-27 LAB — FOLATE: Folate: >20 ng/mL

## 2014-06-27 LAB — VITAMIN B12: Vitamin B-12: >2000 pg/mL — ABNORMAL HIGH (ref 211–911)

## 2014-06-27 LAB — GLUCOSE, CAPILLARY
GLUCOSE-CAPILLARY: 80 mg/dL (ref 70–99)
Glucose-Capillary: 171 mg/dL — ABNORMAL HIGH (ref 70–99)
Glucose-Capillary: 210 mg/dL — ABNORMAL HIGH (ref 70–99)
Glucose-Capillary: 169 mg/dL — ABNORMAL HIGH (ref 70–99)

## 2014-06-27 LAB — IRON AND TIBC
Iron: 16 ug/dL — ABNORMAL LOW (ref 42–135)
Saturation Ratios: 4 % — ABNORMAL LOW (ref 20–55)
TIBC: 370 ug/dL (ref 250–470)
UIBC: 354 ug/dL (ref 125–400)

## 2014-06-27 LAB — FERRITIN: Ferritin: 47 ng/mL (ref 10–291)

## 2014-06-27 MED ORDER — AMLODIPINE BESYLATE 5 MG PO TABS
5.0000 mg | ORAL_TABLET | Freq: Every day | ORAL | Status: DC
  Administered 2014-06-27 – 2014-07-04 (×8): 5 mg via ORAL
  Filled 2014-06-27 (×8): qty 1

## 2014-06-27 MED ORDER — HYDRALAZINE HCL 20 MG/ML IJ SOLN
10.0000 mg | Freq: Four times a day (QID) | INTRAMUSCULAR | Status: DC | PRN
  Administered 2014-06-27: 10 mg via INTRAVENOUS
  Filled 2014-06-27: qty 1

## 2014-06-27 NOTE — Progress Notes (Signed)
Patient has completed blood transfusion and has tolerated it without any signs or symptoms of any reaction or discomfort.  Patient had a large bowel movement at bedside commode and states she feels a lot better and is now resting.  For Informational purposes, notified on-call hospitalist of patient changing her mind in regards to her Code Status. Patient now wants to be a full code and not a DNR. Patient stated to nurse that she wants to prolong her life if she can in case anything happened to her.   No further issues at this time. Will continue to monitor patient to end of shift.

## 2014-06-27 NOTE — Plan of Care (Signed)
Problem: Phase I Progression Outcomes Goal: Hemodynamically stable Outcome: Progressing Vital signs stable.  BP remains systolic elevation, although, stable.

## 2014-06-27 NOTE — Progress Notes (Signed)
[facility] Progress Note   Dana Blackwell IRC:[id number] DOB: [birth year] DOA: 06/26/2014 PCP: [name], MD  Brief narrative: Dana Blackwell is a 80 y.o. female with PMH of CAD, recent NSTEMI s/p PTCA of the LAD and subsequent DES stent to left circumflex in 9/15, DM, HTN, Dyslipidemia, COPD/Pulm HTN on home O2, chronic diastolic CHF, presents to the ER with weakness and dyspnea. She is found to be anemic with heme positive stools and fluid overloaded.    Subjective: Dyspnea somewhat improved. Having some cramping in legs. Continues to have pain in upper abdomen which has been ongoing for about 3 wks.   Assessment/Plan: Principal Problem:  Anemia/normocytic  -trace heme positive stools -anemia panel reveals low Iron levels and low normal Ferretin -reports colonoscopy >10-15 years ago, cannot recall provider, EUS per Dr.Outlaw in 2011 -IV PPI -transfused 1 unit PRBC bringing Hb up from 8 to 9.6 -[facility] consulted- due to significant epigastric pain, GI considering EGD  Acute on chronic diastolic CHF -cont IV lasix today 40mg  q12 -follow I/Os, weights -last ECHo with EF 50-55% in 9/15 -continue Metoprolol and low dose ACE - Cardiology has evaluated   CKD 3 - stable- follow with diuresis  Elevated LFTs -denies any ETOH use -normal LFTs in 2011, none checked since, so recent baseline unknown -stop statin, check RUQ US reveals coarse liver parenchyma (cirrhosis?)  -could be due to passive hepatic congestion from CHF   CAD, recent NSTEMI and PTCI of LAD and DES to circumflex -cont  ASA/ - OK to hold plavix per cardiology for now - cont metoprolol -hold statin   Diabetes mellitus -continue lantus, SSI - A1c 7/5  Essential hypertension -cotninue ACE, imdur and metoprolol  COPD (chronic obstructive pulmonary disease)/ Pulmonary HTN -stable     Code Status: Full code Family Communication:  Disposition Plan: to be determined DVT  prophylaxis: SCDs  Consultants: GI Cardiology  Procedures: none  Antibiotics: Anti-infectives    None      Objective: Filed Weights   06/26/14 1728 06/27/14 0439  Weight: 68.629 kg (151 lb 4.8 oz) 67.5 kg (148 lb 13 oz)    Intake/Output Summary (Last 24 hours) at 06/27/14 1728 Last data filed at 06/27/14 1422  Gross per 24 hour  Intake  635.5 ml  Output   3025 ml  Net -2389.5 ml     Vitals Filed Vitals:   06/27/14 0439 06/27/14 0646 06/27/14 1300 06/27/14 1430  BP: 181/74 178/72 150/53 179/61  Pulse: 58  58 59  Temp: 97.2 F (36.2 C)  98.1 F (36.7 C) 97.9 F (36.6 C)  TempSrc: Oral  Oral Oral  Resp: 18  20 20   Height:      Weight: 67.5 kg (148 lb 13 oz)     SpO2: 99%  96% 97%    Exam: General: No acute respiratory distress Lungs: crackles in b/l lower lobes Cardiovascular: Regular rate and rhythm without murmur gallop or rub normal S1 and S2 Abdomen: Tender in epigastrium, nondistended, soft, bowel sounds positive, no rebound, no ascites, no appreciable mass Extremities: No significant cyanosis, clubbing, or edema bilateral lower extremities  Data Reviewed: Basic Metabolic Panel:  Recent Labs Lab 06/26/14 1255 06/26/14 1906  NA 139 139  K 3.6* 4.7  CL 101 101  CO2 24 25  GLUCOSE 129* 244*  BUN 37* 36*  CREATININE 1.49* 1.61*  CALCIUM 8.4 8.6   Liver Function Tests:  Recent Labs Lab 06/26/14 1255 06/26/14 1906  AST 170* 156*  ALT 138* 141*  ALKPHOS 84 93  BILITOT 0.6 0.5  PROT 6.3 6.4  ALBUMIN 2.9* 3.0*   No results for input(s): LIPASE, AMYLASE in the last 168 hours. No results for input(s): AMMONIA in the last 168 hours. CBC:  Recent Labs Lab 06/26/14 1255 06/27/14 0436  WBC 7.6 8.7  NEUTROABS 5.1  --   HGB 8.0* 9.6*  HCT 26.1* 30.7*  MCV 84.2 84.6  PLT 289 253   Cardiac Enzymes: No results for input(s): CKTOTAL, CKMB, CKMBINDEX, TROPONINI in the last 168 hours. BNP (last 3 results)  Recent Labs  04/13/14 1131  05/25/14 1550 06/26/14 1330  PROBNP 351.0* 684.0* 12174.0*   CBG:  Recent Labs Lab 06/26/14 2057 06/27/14 0549 06/27/14 1127 06/27/14 1702  GLUCAP 232* 171* 80 210*    No results found for this or any previous visit (from the past 240 hour(s)).   Studies:  Recent x-ray studies have been reviewed in detail by the Attending Physician  Scheduled Meds:  Scheduled Meds: . amLODipine  5 mg Oral Daily  . aspirin EC  81 mg Oral Daily  . cloNIDine  0.1 mg Oral BID  . furosemide  40 mg Intravenous Q12H  . insulin aspart  0-9 Units Subcutaneous TID WC  . insulin glargine  10 Units Subcutaneous QHS  . isosorbide mononitrate  60 mg Oral Daily  . levothyroxine  75 mcg Oral QAC breakfast  . metoprolol succinate  25 mg Oral Daily  . pantoprazole (PROTONIX) IV  40 mg Intravenous Q12H  . sertraline  100 mg Oral QPM   Continuous Infusions:   Time spent on care of this patient: >35 min   [place], MD 06/27/2014, 5:28 PM  LOS: 1 day   [facility] Office  [phone] Pager - Text Page per [email or web address]  If 7PM-7AM, please contact night-coverage [email or web address]

## 2014-06-27 NOTE — Consult Note (Addendum)
CARDIOLOGY CONSULT NOTE   Patient ID: Mariamawit Depaoli MRN: [id number] DOB/AGE: 1928/03/07 80 y.o.  Admit date: 06/26/2014  Primary Physician   [name], MD Primary Cardiologist  Dr. Burt Knack Reason for Consultation  Management of anticoagulation, GI bleed with recent drug-eluting stents on Plavix and aspirin  TLX:BWIO Mae Myers-Wagoner is a 80 y.o. female with a history of SAH, DM, HTN, Dyslipidemia, COPD/Pulm HTN on home O2, chronic diastolic CHF. She was admitted 09/09-09/07/2014 with a NSTEMI s/p PTCA of the LAD and subsequent DES stent to left circumflex. Neurosurgery had cleared her to be on aspirin and Plavix. She was discharged on these medications. She was seen in the office on 10/05 and was tolerating her medications well. She was planning to move to [place] to be near family at the end of October.  She came to the emergency room with weakness and dyspnea. She was anemic with an H/H of 8.0/26.1. Her iron level was very low at 16. Her stools were heme-positive and she was seen by Dr. Oletta Lamas with gastroenterology. Dr. Oletta Lamas requested cardiology evaluation, she is on Protonix and he is considering an EGD. She took aspirin and Plavix yesterday, but has only had aspirin today. She has not used any additional NSAIDs.  Past Medical History  Diagnosis Date  . Coronary artery disease     11/2012: s/p 3.0 x 28 mm Xience and 2.5 x 28 mm Xience stents to the LAD    . Hypertension   . Hyperlipidemia   . Stroke     hx of  . Obesity   . Heart murmur   . Diabetes mellitus     type II  . Hypothyroidism   . Esophageal stricture   . Gastritis   . Nausea and vomiting   . Diarrhea   . Abdominal pain   . Esophageal reflux   . Depression   . Uterine cancer   . H/O: hysterectomy      Past Surgical History  Procedure Laterality Date  . Bladder surgery      tacking  . Cervical disc surgery    . Cholecystectomy    . Total abdominal hysterectomy      Allergies    Allergen Reactions  . Tetanus Toxoid Other (See Comments)    unknown    I have reviewed the patient's current medications . aspirin EC  81 mg Oral Daily  . cloNIDine  0.1 mg Oral BID  . furosemide  40 mg Intravenous Q12H  . insulin aspart  0-9 Units Subcutaneous TID WC  . insulin glargine  10 Units Subcutaneous QHS  . isosorbide mononitrate  60 mg Oral Daily  . levothyroxine  75 mcg Oral QAC breakfast  . lisinopril  5 mg Oral Daily  . metoprolol succinate  25 mg Oral Daily  . pantoprazole (PROTONIX) IV  40 mg Intravenous Q12H  . sertraline  100 mg Oral QPM     acetaminophen **OR** acetaminophen, hydrALAZINE, HYDROcodone-acetaminophen, nitroGLYCERIN, ondansetron **OR** ondansetron (ZOFRAN) IV, traMADol  Prior to Admission medications   Medication Sig Start Date End Date Taking? Authorizing Provider  aspirin 81 MG tablet Take 81 mg by mouth daily.   Yes Historical Provider, MD  atorvastatin (LIPITOR) 20 MG tablet Take 1 tablet (20 mg total) by mouth daily at 6 PM. 05/25/14  Yes Liliane Shi, PA-C  beta carotene w/minerals (OCUVITE) tablet Take 1 tablet by mouth at bedtime.    Yes Historical Provider, MD  calcium carbonate (OS-CAL)  600 MG TABS tablet Take 600 mg by mouth daily.   Yes Historical Provider, MD  cloNIDine (CATAPRES) 0.1 MG tablet Take 1 tablet (0.1 mg total) by mouth 2 (two) times daily. 05/25/14  Yes Scott T Kathlen Mody, PA-C  Cyanocobalamin (VITAMIN B-12 PO) Take 1 tablet by mouth daily.   Yes Historical Provider, MD  Docusate Calcium (STOOL SOFTENER PO) Take 1 tablet by mouth 2 (two) times daily as needed (constipation).   Yes Historical Provider, MD  folic acid-pyridoxine-cyancobalamin (FOLTX) 2.5-25-2 MG TABS Take 1 tablet by mouth daily.    Yes Historical Provider, MD  furosemide (LASIX) 40 MG tablet Take 1 tablet (40 mg total) by mouth daily. 05/25/14  Yes Liliane Shi, PA-C  HYDROcodone-acetaminophen (NORCO/VICODIN) 5-325 MG per tablet Take 1 tablet by mouth every 6  (six) hours as needed for moderate pain. 02/27/14  Yes Adeline C Viyuoh, MD  insulin glargine (LANTUS) 100 UNIT/ML injection Inject 0.1 mLs (10 Units total) into the skin at bedtime. 11/28/13  Yes Rhonda G Barrett, PA-C  insulin lispro (HUMALOG) 100 UNIT/ML injection Inject 4-5 Units into the skin 3 (three) times daily before meals.   Yes Historical Provider, MD  isosorbide mononitrate (IMDUR) 60 MG 24 hr tablet Take 1 tablet (60 mg total) by mouth daily. 04/22/14  Yes Scott Joylene Draft, PA-C  levothyroxine (SYNTHROID, LEVOTHROID) 75 MCG tablet Take 75 mcg by mouth daily.     Yes Historical Provider, MD  lisinopril (PRINIVIL,ZESTRIL) 5 MG tablet Take 1 tablet (5 mg total) by mouth daily. 05/25/14  Yes Liliane Shi, PA-C  metoprolol succinate (TOPROL XL) 25 MG 24 hr tablet Take 1 tablet (25 mg total) by mouth daily. 05/25/14  Yes Liliane Shi, PA-C  nitroGLYCERIN (NITROSTAT) 0.4 MG SL tablet Place 1 tablet (0.4 mg total) under the tongue every 5 (five) minutes as needed for chest pain. 05/25/14  Yes Liliane Shi, PA-C  Omega 3-6-9 CAPS Take 1 capsule by mouth daily.   Yes Historical Provider, MD  pantoprazole (PROTONIX) 40 MG tablet Take 1 tablet (40 mg total) by mouth daily. Patient taking differently: Take 40 mg by mouth 2 (two) times daily.  05/25/14  Yes Liliane Shi, PA-C  sertraline (ZOLOFT) 100 MG tablet Take 100 mg by mouth every evening.    Yes Historical Provider, MD  traMADol (ULTRAM) 50 MG tablet Take 1-2 tablets (50-100 mg total) by mouth every 6 (six) hours as needed (50mg  for mild pain, 75mg  for moderate pain, 100mg  for severe pain). 02/27/14  Yes Adeline Saralyn Pilar, MD  acetaminophen (TYLENOL) 650 MG CR tablet Take 1,300 mg by mouth at bedtime.     Historical Provider, MD  clopidogrel (PLAVIX) 75 MG tablet Take 1 tablet (75 mg total) by mouth daily. 05/25/14   Liliane Shi, PA-C  fish oil-omega-3 fatty acids 1000 MG capsule Take 1 g by mouth daily.     Historical Provider, MD  MAGNESIUM PO  Take 1 tablet by mouth at bedtime.     Historical Provider, MD     History   Social History  . Marital Status: Divorced    Spouse Name: N/A    Number of Children: N/A  . Years of Education: N/A   Occupational History  . Retired    Social History Main Topics  . Smoking status: Never Smoker   . Smokeless tobacco: Never Used  . Alcohol Use: No  . Drug Use: No  . Sexual Activity: No   Other Topics  Concern  . Not on file   Social History Narrative   Lives alone.    Family Status  Relation Status Death Age  . Mother Deceased   . Father Deceased    Family History  Problem Relation Age of Onset  . Heart attack Mother 16    died  . Coronary artery disease      family hx on  father side  . Heart attack Brother   . Heart attack Sister      ROS:  Full 14 point review of systems complete and found to be negative unless listed above.  Physical Exam: Blood pressure 178/72, pulse 58, temperature 97.2 F (36.2 C), temperature source Oral, resp. rate 18, height 4\' 10"  (1.473 m), weight 148 lb 13 oz (67.5 kg), SpO2 99 %.  General: Well developed, well nourished, female in no acute distress Head: Eyes PERRLA, No xanthomas.   Normocephalic and atraumatic, oropharynx without edema or exudate. Dentition:  Lungs:  Heart: HRRR S1 S2, no rub/gallop, Heart irregular rate and rhythm with S1, S2  murmur. pulses are 2+ extrem.   Neck: No carotid bruits. No lymphadenopathy.  JVD. Abdomen: Bowel sounds present, abdomen soft and non-tender without masses or hernias noted. Msk:  No spine or cva tenderness. No weakness, no joint deformities or effusions. Extremities: No clubbing or cyanosis.  edema.  Neuro: Alert and oriented X 3. No focal deficits noted. Psych:  Good affect, responds appropriately Skin: No rashes or lesions noted.  Labs:   Lab Results  Component Value Date   WBC 8.7 06/27/2014   HGB 9.6* 06/27/2014   HCT 30.7* 06/27/2014   MCV 84.6 06/27/2014   PLT 253 06/27/2014    No results for input(s): INR in the last 72 hours.  Recent Labs Lab 06/26/14 1906  NA 139  K 4.7  CL 101  CO2 25  BUN 36*  CREATININE 1.61*  CALCIUM 8.6  PROT 6.4  BILITOT 0.5  ALKPHOS 93  ALT 141*  AST 156*  GLUCOSE 244*  ALBUMIN 3.0*   No results found for: MG No results for input(s): CKTOTAL, CKMB, TROPONINI in the last 72 hours.  Recent Labs  06/26/14 1347  TROPIPOC 0.02   PRO B NATRIURETIC PEPTIDE (BNP)  Date/Time Value Ref Range Status  06/26/2014 01:30 PM 12174.0* 0 - 450 pg/mL Final  05/25/2014 03:50 PM 684.0* 0.0 - 100.0 pg/mL Final   Lab Results  Component Value Date   CHOL  05/28/2010    138        ATP III CLASSIFICATION:  <200     mg/dL   Desirable  200-239  mg/dL   Borderline High  >=240    mg/dL   High          HDL 40 05/28/2010   LDLCALC  05/28/2010    62        Total Cholesterol/HDL:CHD Risk Coronary Heart Disease Risk Table                     Men   Women  1/2 Average Risk   3.4   3.3  Average Risk       5.0   4.4  2 X Average Risk   9.6   7.1  3 X Average Risk  23.4   11.0        Use the calculated Patient Ratio above and the CHD Risk Table to determine the patient's CHD Risk.  ATP III CLASSIFICATION (LDL):  <100     mg/dL   Optimal  100-129  mg/dL   Near or Above                    Optimal  130-159  mg/dL   Borderline  160-189  mg/dL   High  >190     mg/dL   Very High   TRIG 179* 05/28/2010   Lab Results  Component Value Date   DDIMER 1.69* 02/23/2014   LIPASE  Date/Time Value Ref Range Status  10/07/2009 09:12 AM 43 11 - 59 U/L Final   TSH  Date/Time Value Ref Range Status  05/28/2010 12:20 AM 0.511 0.350 - 4.500 uIU/mL Final   VITAMIN B-12  Date/Time Value Ref Range Status  06/26/2014 07:06 PM >2000* 211 - 911 pg/mL Final    Comment:    Performed at [facility]  Date/Time Value Ref Range Status  06/26/2014 07:06 PM >20.0 ng/mL Final    Comment:    (NOTE) Reference Ranges         Deficient:       0.4 - 3.3 ng/mL        Indeterminate:   3.4 - 5.4 ng/mL        Normal:              > 5.4 ng/mL Performed at [facility]    FERRITIN  Date/Time Value Ref Range Status  06/26/2014 07:06 PM 47 10 - 291 ng/mL Final    Comment:    Performed at [facility]   TIBC  Date/Time Value Ref Range Status  06/26/2014 07:06 PM 370 250 - 470 ug/dL Final   IRON  Date/Time Value Ref Range Status  06/26/2014 07:06 PM 16* 42 - 135 ug/dL Final   RETIC CT PCT  Date/Time Value Ref Range Status  06/26/2014 07:06 PM 2.1 0.4 - 3.1 % Final    Echo: 04/30/14 Study Conclusions  - Left ventricle: The cavity size was normal. Wall thickness was normal. Systolic function was normal. The estimated ejection fraction was in the range of 50% to 55%. Wall motion was normal; there were no regional wall motion abnormalities. Doppler parameters are consistent with abnormal left ventricular relaxation (grade 1 diastolic dysfunction). Doppler parameters are consistent with high ventricular filling pressure. - Mitral valve: Calcified annulus. There was mild regurgitation. - Left atrium: The atrium was severely dilated. - Pulmonary arteries: Systolic pressure was severely increased. PA peak pressure: 60 mm Hg (S).  Impressions:  - Normal LV function; grade 1 diastolic dysfunction with elevated left ventricular filling pressures; severe LAE; mild MR and TR; severely elevated pulmonary pressures.  ECG:  SR, non-specific ST-T wave changes, unchanged from prior ECG on 9/10.  Radiology:  Dg Chest 2 View  06/26/2014   CLINICAL DATA:  Shortness of breath, chest pain  EXAM: CHEST  2 VIEW  COMPARISON:  04/29/2014  FINDINGS: New moderate-sized right and small left pleural effusions are noted, obscuring the lung bases. Heart size is at upper limits of normal. Bilateral hilar prominence with central tapering may suggest pulmonary arterial hypertension. Leftward curvature  of the spine centered at T12 is again noted. Cholecystectomy clips are noted. A few interstitial Kerley B-lines are noted.  IMPRESSION: New moderate right and small left pleural effusions obscuring the lung bases. This could obscure underlying pneumonia and/or atelectasis. Peripheral interstitial Kerley B-lines could indicate superimposed early interstitial edema.   Electronically Signed  By: Conchita Paris M.D.   On: 06/26/2014 14:04      ASSESSMENT AND PLAN:   The patient was seen today by Dr. Meda Coffee, the patient evaluated and the data reviewed.   Principal Problem:   Anemia Active Problems:   Diabetes mellitus   Essential hypertension   COPD (chronic obstructive pulmonary disease)   Pulmonary HTN   CAD in native artery   Acute on chronic diastolic congestive heart failure   GI bleed   Absolute anemia   Signed: Rosaria Ferries, PA-C 06/27/2014 10:06 AM Beeper [phone]  Co-Sign MD   The patient was seen, examined and discussed with Rosaria Ferries, PA-C and I agree with the above.   61 year ol female with h/o CAD, recent PTCA to LAD for in-stent restenosis and PCI/DES to LCX on 04/29/2014. Admitted with GIB, Hb 8.0 yesterday from 10.2 on 9/11, s/p PRBC transfusion Hb today 9.6.  1. GIB - Agree with holding Plavix, continue aspirin. Agree with planning EGD for identification of any reversible/treatable sources and possibly being able to restart Plavix. Required length - 1 year.  2. CAD - ECG is unchanged. Continue toprol, atorvastatin.  3. HTN - uncontrolled, add amlodipine 5 mg po daily  4. Acute on chronic diastolic CHF - BNP 06.004, agree with iv Lasix 40 mg BID, monitor Crea closely  5. Pulmonary hypertension - would benefit from amlodipine  6. Acute on chronic CKD - hold lisinopril, follow Crea, baseline 0.9 -1.1 ---> 1.6    Dorothy Spark 06/27/2014

## 2014-06-27 NOTE — Progress Notes (Signed)
Blood Transfusion ended/ stopped at 0143.

## 2014-06-27 NOTE — Consult Note (Signed)
[facility] CONSULT Reason for consult:positive stools and anemia Referring Physician: [facility]. PCP: Dr. Maudie Mercury. G.I.: Dr. Georgana Curio Dana Blackwell is an 80 y.o. female.  HPI: she was admitted after being found to be anemic and have elevated LFTs. She is Post cholecystectomy and had elevated LFTs 4 years ago. She saw Dr. Paulita Fujita and she underwent EUS and workup for elevated LFTs. She had a dilated CBD but no common duct stones at that time.the patient's LFTs remain minimally elevated but total bilirubin is normal. Hemoglobin was low at 8.0 and stools were traced positive. Were asked to see her regarding these issues. She reports that she has not seen any bright blood in her stool, and reports that she has been having epigastric pain for approximately 4 to 6 weeks intermittently. Long discussion with her about this pain difficult to get a clear-cut answer regarding the nature of the pain.patient has an extensive cardiac history and has had recent NSTEMI's with angioplasty and stent. She has had COPD with pulmonary hypertension in his own home 02.she adamantly denies taking any NSAIDs that she purchases over-the-counter. She states they had kept her off of all anticoagulation or antiplatelet agents until recently when she was started back on aspirin by her cardiologist.she does take Vicodin for various aches and pains.she was started recently on protonix and doesn't know if this is helped her epigastric pain or not. It's not clear if this pain gets better or worse with eating. The patient reports that she had a colonoscopy by Dr. Collene Mares 10 years ago or more tells me that it was "ok". Past Medical History  Diagnosis Date  . Coronary artery disease     11/2012: s/p 3.0 x 28 mm Xience and 2.5 x 28 mm Xience stents to the LAD    . Hypertension   . Hyperlipidemia   . Stroke     hx of  . Obesity   . Heart murmur   . Diabetes mellitus     type II  . Hypothyroidism   . Esophageal  stricture   . Gastritis   . Nausea and vomiting   . Diarrhea   . Abdominal pain   . Esophageal reflux   . Depression   . Uterine cancer   . H/O: hysterectomy     Past Surgical History  Procedure Laterality Date  . Bladder surgery      tacking  . Cervical disc surgery    . Cholecystectomy    . Total abdominal hysterectomy      Family History  Problem Relation Age of Onset  . Heart attack Mother 59    died  . Coronary artery disease      family hx on  father side  . Heart attack Brother   . Heart attack Sister     Social History:  reports that she has never smoked. She has never used smokeless tobacco. She reports that she does not drink alcohol or use illicit drugs.  Allergies:  Allergies  Allergen Reactions  . Tetanus Toxoid Other (See Comments)    unknown    Medications; Prior to Admission medications   Medication Sig Start Date End Date Taking? Authorizing Provider  aspirin 81 MG tablet Take 81 mg by mouth daily.   Yes Historical Provider, MD  atorvastatin (LIPITOR) 20 MG tablet Take 1 tablet (20 mg total) by mouth daily at 6 PM. 05/25/14  Yes Liliane Shi, PA-C  beta carotene w/minerals (OCUVITE) tablet Take 1 tablet by  mouth at bedtime.    Yes Historical Provider, MD  calcium carbonate (OS-CAL) 600 MG TABS tablet Take 600 mg by mouth daily.   Yes Historical Provider, MD  cloNIDine (CATAPRES) 0.1 MG tablet Take 1 tablet (0.1 mg total) by mouth 2 (two) times daily. 05/25/14  Yes Scott T Kathlen Mody, PA-C  Cyanocobalamin (VITAMIN B-12 PO) Take 1 tablet by mouth daily.   Yes Historical Provider, MD  Docusate Calcium (STOOL SOFTENER PO) Take 1 tablet by mouth 2 (two) times daily as needed (constipation).   Yes Historical Provider, MD  folic acid-pyridoxine-cyancobalamin (FOLTX) 2.5-25-2 MG TABS Take 1 tablet by mouth daily.    Yes Historical Provider, MD  furosemide (LASIX) 40 MG tablet Take 1 tablet (40 mg total) by mouth daily. 05/25/14  Yes Liliane Shi, PA-C   HYDROcodone-acetaminophen (NORCO/VICODIN) 5-325 MG per tablet Take 1 tablet by mouth every 6 (six) hours as needed for moderate pain. 02/27/14  Yes Adeline C Viyuoh, MD  insulin glargine (LANTUS) 100 UNIT/ML injection Inject 0.1 mLs (10 Units total) into the skin at bedtime. 11/28/13  Yes Rhonda G Barrett, PA-C  insulin lispro (HUMALOG) 100 UNIT/ML injection Inject 4-5 Units into the skin 3 (three) times daily before meals.   Yes Historical Provider, MD  isosorbide mononitrate (IMDUR) 60 MG 24 hr tablet Take 1 tablet (60 mg total) by mouth daily. 04/22/14  Yes Scott Joylene Draft, PA-C  levothyroxine (SYNTHROID, LEVOTHROID) 75 MCG tablet Take 75 mcg by mouth daily.     Yes Historical Provider, MD  lisinopril (PRINIVIL,ZESTRIL) 5 MG tablet Take 1 tablet (5 mg total) by mouth daily. 05/25/14  Yes Liliane Shi, PA-C  metoprolol succinate (TOPROL XL) 25 MG 24 hr tablet Take 1 tablet (25 mg total) by mouth daily. 05/25/14  Yes Liliane Shi, PA-C  nitroGLYCERIN (NITROSTAT) 0.4 MG SL tablet Place 1 tablet (0.4 mg total) under the tongue every 5 (five) minutes as needed for chest pain. 05/25/14  Yes Liliane Shi, PA-C  Omega 3-6-9 CAPS Take 1 capsule by mouth daily.   Yes Historical Provider, MD  pantoprazole (PROTONIX) 40 MG tablet Take 1 tablet (40 mg total) by mouth daily. Patient taking differently: Take 40 mg by mouth 2 (two) times daily.  05/25/14  Yes Liliane Shi, PA-C  sertraline (ZOLOFT) 100 MG tablet Take 100 mg by mouth every evening.    Yes Historical Provider, MD  traMADol (ULTRAM) 50 MG tablet Take 1-2 tablets (50-100 mg total) by mouth every 6 (six) hours as needed (8m for mild pain, 760mfor moderate pain, 10062mor severe pain). 02/27/14  Yes Adeline C VSaralyn PilarD  acetaminophen (TYLENOL) 650 MG CR tablet Take 1,300 mg by mouth at bedtime.     Historical Provider, MD  clopidogrel (PLAVIX) 75 MG tablet Take 1 tablet (75 mg total) by mouth daily. 05/25/14   ScoLiliane ShiA-C  fish oil-omega-3  fatty acids 1000 MG capsule Take 1 g by mouth daily.     Historical Provider, MD  MAGNESIUM PO Take 1 tablet by mouth at bedtime.     Historical Provider, MD   . aspirin EC  81 mg Oral Daily  . cloNIDine  0.1 mg Oral BID  . clopidogrel  75 mg Oral Daily  . furosemide  40 mg Intravenous Q12H  . insulin aspart  0-9 Units Subcutaneous TID WC  . insulin glargine  10 Units Subcutaneous QHS  . isosorbide mononitrate  60 mg Oral Daily  . levothyroxine  75 mcg Oral QAC breakfast  . lisinopril  5 mg Oral Daily  . metoprolol succinate  25 mg Oral Daily  . pantoprazole (PROTONIX) IV  40 mg Intravenous Q12H  . sertraline  100 mg Oral QPM   PRN Meds acetaminophen **OR** acetaminophen, hydrALAZINE, HYDROcodone-acetaminophen, nitroGLYCERIN, ondansetron **OR** ondansetron (ZOFRAN) IV, traMADol Results for orders placed or performed during the hospital encounter of 06/26/14 (from the past 48 hour(s))  CBC with Differential     Status: Abnormal   Collection Time: 06/26/14 12:55 PM  Result Value Ref Range   WBC 7.6 4.0 - 10.5 K/uL   RBC 3.10 (Blackwell) 3.87 - 5.11 MIL/uL   Hemoglobin 8.0 (Blackwell) 12.0 - 15.0 g/dL    Comment: REPEATED TO VERIFY   HCT 26.1 (Blackwell) 36.0 - 46.0 %   MCV 84.2 78.0 - 100.0 fL   MCH 25.8 (Blackwell) 26.0 - 34.0 pg   MCHC 30.7 30.0 - 36.0 g/dL   RDW 17.9 (H) 11.5 - 15.5 %   Platelets 289 150 - 400 K/uL    Comment: PLATELET COUNT CONFIRMED BY SMEAR   Neutrophils Relative % 67 43 - 77 %   Lymphocytes Relative 17 12 - 46 %   Monocytes Relative 15 (H) 3 - 12 %   Eosinophils Relative 1 0 - 5 %   Basophils Relative 0 0 - 1 %   Neutro Abs 5.1 1.7 - 7.7 K/uL   Lymphs Abs 1.3 0.7 - 4.0 K/uL   Monocytes Absolute 1.1 (H) 0.1 - 1.0 K/uL   Eosinophils Absolute 0.1 0.0 - 0.7 K/uL   Basophils Absolute 0.0 0.0 - 0.1 K/uL   RBC Morphology ELLIPTOCYTES     Comment: POLYCHROMASIA PRESENT  Comprehensive metabolic panel     Status: Abnormal   Collection Time: 06/26/14 12:55 PM  Result Value Ref Range    Sodium 139 137 - 147 mEq/Blackwell   Potassium 3.6 (Blackwell) 3.7 - 5.3 mEq/Blackwell   Chloride 101 96 - 112 mEq/Blackwell   CO2 24 19 - 32 mEq/Blackwell   Glucose, Bld 129 (H) 70 - 99 mg/dL   BUN 37 (H) 6 - 23 mg/dL   Creatinine, Ser 1.49 (H) 0.50 - 1.10 mg/dL   Calcium 8.4 8.4 - 10.5 mg/dL   Total Protein 6.3 6.0 - 8.3 g/dL   Albumin 2.9 (Blackwell) 3.5 - 5.2 g/dL   AST 170 (H) 0 - 37 U/Blackwell   ALT 138 (H) 0 - 35 U/Blackwell   Alkaline Phosphatase 84 39 - 117 U/Blackwell   Total Bilirubin 0.6 0.3 - 1.2 mg/dL   GFR calc non Af Amer 31 (Blackwell) >90 mL/min   GFR calc Af Amer 36 (Blackwell) >90 mL/min    Comment: (NOTE) The eGFR has been calculated using the CKD EPI equation. This calculation has not been validated in all clinical situations. eGFR's persistently <90 mL/min signify possible Chronic Kidney Disease.    Anion gap 14 5 - 15  BNP (order ONLY if patient complains of dyspnea/SOB AND you have documented it for THIS visit)     Status: Abnormal   Collection Time: 06/26/14  1:30 PM  Result Value Ref Range   Pro B Natriuretic peptide (BNP) 12174.0 (H) 0 - 450 pg/mL  Type and screen for Red Blood Exchange     Status: None   Collection Time: 06/26/14  1:30 PM  Result Value Ref Range   ABO/RH(D) A POS    Antibody Screen NEG    Sample Expiration 06/29/2014  Unit Number [id number]    Blood Component Type RED CELLS,LR    Unit division 00    Status of Unit ISSUED,FINAL    Transfusion Status OK TO TRANSFUSE    Crossmatch Result Compatible   POC occult blood, ED Provider will collect     Status: Abnormal   Collection Time: 06/26/14  1:41 PM  Result Value Ref Range   Fecal Occult Bld POSITIVE (A) NEGATIVE  I-stat troponin, ED (not at [facility])     Status: None   Collection Time: 06/26/14  1:47 PM  Result Value Ref Range   Troponin i, poc 0.02 0.00 - 0.08 ng/mL   Comment 3            Comment: Due to the release kinetics of cTnI, a negative result within the first hours of the onset of symptoms does not rule out myocardial infarction with certainty. If  myocardial infarction is still suspected, repeat the test at appropriate intervals.   Prepare RBC     Status: None   Collection Time: 06/26/14  6:00 PM  Result Value Ref Range   Order Confirmation ORDER PROCESSED BY BLOOD BANK   Vitamin B12     Status: Abnormal   Collection Time: 06/26/14  7:06 PM  Result Value Ref Range   Vitamin B-12 >2000 (H) 211 - 911 pg/mL    Comment: Performed at [facility]  Folate     Status: None   Collection Time: 06/26/14  7:06 PM  Result Value Ref Range   Folate >20.0 ng/mL    Comment: (NOTE) Reference Ranges        Deficient:       0.4 - 3.3 ng/mL        Indeterminate:   3.4 - 5.4 ng/mL        Normal:              > 5.4 ng/mL Performed at [facility]   Iron and TIBC     Status: Abnormal   Collection Time: 06/26/14  7:06 PM  Result Value Ref Range   Iron 16 (Blackwell) 42 - 135 ug/dL   TIBC 370 250 - 470 ug/dL   Saturation Ratios 4 (Blackwell) 20 - 55 %   UIBC 354 125 - 400 ug/dL    Comment: Performed at [facility]  Ferritin     Status: None   Collection Time: 06/26/14  7:06 PM  Result Value Ref Range   Ferritin 47 10 - 291 ng/mL    Comment: Performed at [facility]  Reticulocytes     Status: Abnormal   Collection Time: 06/26/14  7:06 PM  Result Value Ref Range   Retic Ct Pct 2.1 0.4 - 3.1 %   RBC. 3.34 (Blackwell) 3.87 - 5.11 MIL/uL   Retic Count, Manual 70.1 19.0 - 186.0 K/uL  Comprehensive metabolic panel     Status: Abnormal   Collection Time: 06/26/14  7:06 PM  Result Value Ref Range   Sodium 139 137 - 147 mEq/Blackwell   Potassium 4.7 3.7 - 5.3 mEq/Blackwell    Comment: NO VISIBLE HEMOLYSIS   Chloride 101 96 - 112 mEq/Blackwell   CO2 25 19 - 32 mEq/Blackwell   Glucose, Bld 244 (H) 70 - 99 mg/dL   BUN 36 (H) 6 - 23 mg/dL   Creatinine, Ser 1.61 (H) 0.50 - 1.10 mg/dL   Calcium 8.6 8.4 - 10.5 mg/dL   Total Protein 6.4 6.0 - 8.3 g/dL  Albumin 3.0 (Blackwell) 3.5 - 5.2 g/dL   AST 156 (H) 0 - 37 U/Blackwell   ALT 141 (H) 0 - 35 U/Blackwell   Alkaline Phosphatase 93 39 -  117 U/Blackwell   Total Bilirubin 0.5 0.3 - 1.2 mg/dL   GFR calc non Af Amer 28 (Blackwell) >90 mL/min   GFR calc Af Amer 33 (Blackwell) >90 mL/min    Comment: (NOTE) The eGFR has been calculated using the CKD EPI equation. This calculation has not been validated in all clinical situations. eGFR's persistently <90 mL/min signify possible Chronic Kidney Disease.    Anion gap 13 5 - 15  Hemoglobin A1c     Status: Abnormal   Collection Time: 06/26/14  7:06 PM  Result Value Ref Range   Hgb A1c MFr Bld 7.5 (H) <5.7 %    Comment: (NOTE)                                                                       According to the ADA Clinical Practice Recommendations for 2011, when HbA1c is used as a screening test:  >=6.5%   Diagnostic of Diabetes Mellitus           (if abnormal result is confirmed) 5.7-6.4%   Increased risk of developing Diabetes Mellitus References:Diagnosis and Classification of Diabetes Mellitus,Diabetes YPPJ,0932,67(TIWPY 1):S62-S69 and Standards of Medical Care in         Diabetes - 2011,Diabetes Care,2011,34 (Suppl 1):S11-S61.    Mean Plasma Glucose 169 (H) <117 mg/dL    Comment: Performed at [facility]  Glucose, capillary     Status: Abnormal   Collection Time: 06/26/14  8:57 PM  Result Value Ref Range   Glucose-Capillary 232 (H) 70 - 99 mg/dL  CBC     Status: Abnormal   Collection Time: 06/27/14  4:36 AM  Result Value Ref Range   WBC 8.7 4.0 - 10.5 K/uL   RBC 3.63 (Blackwell) 3.87 - 5.11 MIL/uL   Hemoglobin 9.6 (Blackwell) 12.0 - 15.0 g/dL   HCT 30.7 (Blackwell) 36.0 - 46.0 %   MCV 84.6 78.0 - 100.0 fL   MCH 26.4 26.0 - 34.0 pg   MCHC 31.3 30.0 - 36.0 g/dL   RDW 17.4 (H) 11.5 - 15.5 %   Platelets 253 150 - 400 K/uL  Glucose, capillary     Status: Abnormal   Collection Time: 06/27/14  5:49 AM  Result Value Ref Range   Glucose-Capillary 171 (H) 70 - 99 mg/dL   Comment 1 Notify RN     Dg Chest 2 View  06/26/2014   CLINICAL DATA:  Shortness of breath, chest pain  EXAM: CHEST  2 VIEW   COMPARISON:  04/29/2014  FINDINGS: New moderate-sized right and small left pleural effusions are noted, obscuring the lung bases. Heart size is at upper limits of normal. Bilateral hilar prominence with central tapering may suggest pulmonary arterial hypertension. Leftward curvature of the spine centered at T12 is again noted. Cholecystectomy clips are noted. A few interstitial Kerley B-lines are noted.  IMPRESSION: New moderate right and small left pleural effusions obscuring the lung bases. This could obscure underlying pneumonia and/or atelectasis. Peripheral interstitial Kerley B-lines could indicate superimposed early interstitial edema.   Electronically Signed  By: Conchita Paris M.D.   On: 06/26/2014 14:04              Blood pressure 178/72, pulse 58, temperature 97.2 F (36.2 C), temperature source Oral, resp. rate 18, height _0  (1.473 m), weight 67.5 kg (148 lb 13 oz), SpO2 99 %.  Physical exam:   General--elderly frail white female in hospital bed with 02 by nasal cannula. Neck: supple with no lymphadenopathy Heart--regular rate and rhythm without murmurs are gallops Lungs--clear Abdomen--none distended with good bowel sounds soft. There is mild epigastric tenderness.   Assessment: 1. Anemia/heme positive stools. This could be due to many different things. The issue will be how aggressive to be given her overall medical conditions. With the epigastric discomfort it may be that an EGD would be reasonable 2. Epigastric abdominal pain. This appears to be a recent onset in the patient adamantly denies use of any NSAIDs. She has been on protonix for an undetermined period of time. It may be reasonable to do an EGD based on old brother health problems. 3. CAD with stents history of MI etc. 4. COPD on chronic 02  Plan: Agree with going ahead and transfuse in the patient now. Would have cardiology see her. It felt to be reasonable I would probably go ahead with EGD is 1st step  given her epigastric discomfort. We definitely should keep her empirically on PPI therapy   Dana Blackwell,Dana Blackwell 06/27/2014, 9:25 AM

## 2014-06-28 ENCOUNTER — Encounter (HOSPITAL_COMMUNITY): Payer: Self-pay | Admitting: Physician Assistant

## 2014-06-28 DIAGNOSIS — I509 Heart failure, unspecified: Secondary | ICD-10-CM | POA: Insufficient documentation

## 2014-06-28 DIAGNOSIS — N183 Chronic kidney disease, stage 3 unspecified: Secondary | ICD-10-CM | POA: Diagnosis present

## 2014-06-28 DIAGNOSIS — J438 Other emphysema: Secondary | ICD-10-CM

## 2014-06-28 LAB — BASIC METABOLIC PANEL
ANION GAP: 13 (ref 5–15)
BUN: 30 mg/dL — ABNORMAL HIGH (ref 6–23)
CALCIUM: 8.8 mg/dL (ref 8.4–10.5)
CHLORIDE: 103 meq/L (ref 96–112)
CO2: 29 meq/L (ref 19–32)
Creatinine, Ser: 1.57 mg/dL — ABNORMAL HIGH (ref 0.50–1.10)
GFR calc Af Amer: 34 mL/min — ABNORMAL LOW (ref >90)
GFR calc non Af Amer: 29 mL/min — ABNORMAL LOW (ref >90)
GLUCOSE: 110 mg/dL — AB (ref 70–99)
Potassium: 3.8 mEq/L (ref 3.7–5.3)
SODIUM: 145 meq/L (ref 137–147)

## 2014-06-28 LAB — CBC
HEMATOCRIT: 31.4 % — AB (ref 36.0–46.0)
HEMOGLOBIN: 9.7 g/dL — AB (ref 12.0–15.0)
MCH: 26.1 pg (ref 26.0–34.0)
MCHC: 30.9 g/dL (ref 30.0–36.0)
MCV: 84.6 fL (ref 78.0–100.0)
Platelets: 280 10*3/uL (ref 150–400)
RBC: 3.71 MIL/uL — ABNORMAL LOW (ref 3.87–5.11)
RDW: 17.4 % — AB (ref 11.5–15.5)
WBC: 8.5 10*3/uL (ref 4.0–10.5)

## 2014-06-28 LAB — GLUCOSE, CAPILLARY
GLUCOSE-CAPILLARY: 92 mg/dL (ref 70–99)
Glucose-Capillary: 273 mg/dL — ABNORMAL HIGH (ref 70–99)
Glucose-Capillary: 210 mg/dL — ABNORMAL HIGH (ref 70–99)
Glucose-Capillary: 140 mg/dL — ABNORMAL HIGH (ref 70–99)

## 2014-06-28 MED ORDER — FUROSEMIDE 10 MG/ML IJ SOLN: 40.0000 mg | Freq: Every day | INTRAMUSCULAR | Status: DC

## 2014-06-28 MED ORDER — INSULIN ASPART 100 UNIT/ML ~~LOC~~ SOLN
0.0000 [IU] | Freq: Every day | SUBCUTANEOUS | Status: DC
  Administered 2014-06-29: 2 [IU] via SUBCUTANEOUS

## 2014-06-28 MED ORDER — POLYETHYLENE GLYCOL 3350 17 G PO PACK
17.0000 g | PACK | Freq: Two times a day (BID) | ORAL | Status: DC
  Administered 2014-06-28 – 2014-06-29 (×4): 17 g via ORAL
  Filled 2014-06-28 (×6): qty 1

## 2014-06-28 MED ORDER — INSULIN ASPART 100 UNIT/ML ~~LOC~~ SOLN
0.0000 [IU] | Freq: Three times a day (TID) | SUBCUTANEOUS | Status: DC
  Administered 2014-06-28: 5 [IU] via SUBCUTANEOUS
  Administered 2014-06-29 – 2014-06-30 (×2): 8 [IU] via SUBCUTANEOUS
  Administered 2014-06-30: 3 [IU] via SUBCUTANEOUS
  Administered 2014-07-01 (×2): 5 [IU] via SUBCUTANEOUS
  Administered 2014-07-01: 3 [IU] via SUBCUTANEOUS
  Administered 2014-07-02: 2 [IU] via SUBCUTANEOUS
  Administered 2014-07-02: 3 [IU] via SUBCUTANEOUS
  Administered 2014-07-02 – 2014-07-03 (×2): 2 [IU] via SUBCUTANEOUS
  Administered 2014-07-03: 3 [IU] via SUBCUTANEOUS
  Administered 2014-07-03: 2 [IU] via SUBCUTANEOUS
  Administered 2014-07-04: 8 [IU] via SUBCUTANEOUS
  Administered 2014-07-04: 2 [IU] via SUBCUTANEOUS

## 2014-06-28 MED ORDER — FUROSEMIDE 10 MG/ML IJ SOLN
40.0000 mg | Freq: Two times a day (BID) | INTRAMUSCULAR | Status: DC
  Administered 2014-06-28 – 2014-06-29 (×3): 40 mg via INTRAVENOUS
  Filled 2014-06-28 (×4): qty 4

## 2014-06-28 MED ORDER — BISACODYL 5 MG PO TBEC: 10.0000 mg | DELAYED_RELEASE_TABLET | Freq: Every day | ORAL | Status: DC | PRN

## 2014-06-28 NOTE — Progress Notes (Addendum)
[facility] Progress Note   [name] TGY:[id number] DOB: Jun 21, 1928 DOA: 06/26/2014 PCP: [name], MD  Brief narrative: Dana Blackwell is a 80 y.o. female with PMH of CAD, recent NSTEMI s/p PTCA of the LAD and subsequent DES stent to left circumflex in 9/15, DM, HTN, Dyslipidemia, COPD/Pulm HTN on home O2, chronic diastolic CHF, presents to the ER with weakness and dyspnea. She is found to be anemic with heme positive stools and fluid overloaded.    Subjective: Dyspnea improving. Upper abdominal pain has improved but she still feels sore.  Assessment/Plan: Principal Problem:  Anemia/normocytic  -trace heme positive stools -anemia panel reveals low Iron levels and low normal Ferretin -reports colonoscopy >10-15 years ago, cannot recall provider, EUS per Dr.Outlaw in 2011 -IV PPI -transfused 1 unit PRBC bringing Hb up from 8 to 9.6 -[facility] consulted- due to significant epigastric pain, GI considering EGD-will undergo EGD on 11/10  Acute on chronic diastolic CHF - cont IV lasix today 40mg  q12 - follow I/Os, weights - last ECHo with EF 50-55% in 9/15 - continue Metoprolol and low dose ACE - Cardiology has evaluated   CKD 3 - stable- follow with diuresis  Elevated LFTs -denies any ETOH use -normal LFTs in 2011, none checked since, so recent baseline unknown -stopped statin -abdominal ultrasound reveals coarse liver parenchymal echogenicity -could be due to passive hepatic congestion from CHF -recheck tomorrow   CAD, recent NSTEMI and PTCI of LAD and DES to circumflex -cont  ASA/ - OK to hold plavix per cardiology for now until cause of bleeding is found - cont metoprolol -hold statin   Diabetes mellitus -continue lantus, increase SSI to moderate as sugars uncontrolled in the afternoon and evening - A1c 7.5  Essential hypertension -cotninue ACE, imdur and metoprolol  COPD (chronic obstructive pulmonary disease)/ Pulmonary  HTN -stable     Code Status: Full code Family Communication:  Disposition Plan: to be determined DVT prophylaxis: SCDs  Consultants: GI Cardiology  Procedures: none  Antibiotics: Anti-infectives    None      Objective: Filed Weights   06/26/14 1728 06/27/14 0439 06/28/14 0427  Weight: 68.629 kg (151 lb 4.8 oz) 67.5 kg (148 lb 13 oz) 65.6 kg (144 lb 10 oz)    Intake/Output Summary (Last 24 hours) at 06/28/14 1337 Last data filed at 06/28/14 1200  Gross per 24 hour  Intake    480 ml  Output   2900 ml  Net  -2420 ml     Vitals Filed Vitals:   06/27/14 2135 06/28/14 0155 06/28/14 0427 06/28/14 0900  BP: 152/73 155/61 166/77 170/58  Pulse: 73 66 72 67  Temp: 98.3 F (36.8 C) 98 F (36.7 C) 98 F (36.7 C) 97.9 F (36.6 C)  TempSrc: Oral Oral Oral Oral  Resp: 20 20 20 20   Height:      Weight:   65.6 kg (144 lb 10 oz)   SpO2: 94% 95% 96% 95%    Exam: General: No acute respiratory distress Lungs: crackles in b/l lower lobes- right greater than left today Cardiovascular: Regular rate and rhythm without murmur gallop or rub normal S1 and S2 Abdomen: Tender in epigastrium, nondistended, soft, bowel sounds positive, no rebound, no ascites, no appreciable mass Extremities: No significant cyanosis, clubbing, or edema bilateral lower extremities  Data Reviewed: Basic Metabolic Panel:  Recent Labs Lab 06/26/14 1255 06/26/14 1906 06/28/14 0505  NA 139 139 145  K 3.6* 4.7 3.8  CL 101 101 103  CO2 24 25 29   GLUCOSE 129* 244* 110*  BUN 37* 36* 30*  CREATININE 1.49* 1.61* 1.57*  CALCIUM 8.4 8.6 8.8   Liver Function Tests:  Recent Labs Lab 06/26/14 1255 06/26/14 1906  AST 170* 156*  ALT 138* 141*  ALKPHOS 84 93  BILITOT 0.6 0.5  PROT 6.3 6.4  ALBUMIN 2.9* 3.0*   No results for input(s): LIPASE, AMYLASE in the last 168 hours. No results for input(s): AMMONIA in the last 168 hours. CBC:  Recent Labs Lab 06/26/14 1255 06/27/14 0436  06/28/14 0505  WBC 7.6 8.7 8.5  NEUTROABS 5.1  --   --   HGB 8.0* 9.6* 9.7*  HCT 26.1* 30.7* 31.4*  MCV 84.2 84.6 84.6  PLT 289 253 280   Cardiac Enzymes: No results for input(s): CKTOTAL, CKMB, CKMBINDEX, TROPONINI in the last 168 hours. BNP (last 3 results)  Recent Labs  04/13/14 1131 05/25/14 1550 06/26/14 1330  PROBNP 351.0* 684.0* 12174.0*   CBG:  Recent Labs Lab 06/27/14 1127 06/27/14 1702 06/27/14 2139 06/28/14 0647 06/28/14 1128  GLUCAP 80 210* 169* 92 273*    No results found for this or any previous visit (from the past 240 hour(s)).   Studies:  Recent x-ray studies have been reviewed in detail by the Attending Physician  Scheduled Meds:  Scheduled Meds: . amLODipine  5 mg Oral Daily  . aspirin EC  81 mg Oral Daily  . cloNIDine  0.1 mg Oral BID  . [START ON 06/29/2014] furosemide  40 mg Intravenous Daily  . insulin aspart  0-9 Units Subcutaneous TID WC  . insulin glargine  10 Units Subcutaneous QHS  . isosorbide mononitrate  60 mg Oral Daily  . levothyroxine  75 mcg Oral QAC breakfast  . metoprolol succinate  25 mg Oral Daily  . pantoprazole (PROTONIX) IV  40 mg Intravenous Q12H  . polyethylene glycol  17 g Oral BID  . sertraline  100 mg Oral QPM   Continuous Infusions:   Time spent on care of this patient: >35 min   Cleves, MD 06/28/2014, 1:37 PM  LOS: 2 days   [facility] Office  [phone] Pager - Text Page per [email or web address]  If 7PM-7AM, please contact night-coverage [email or web address]

## 2014-06-28 NOTE — Progress Notes (Signed)
Patient sleeping peacefully. Maintained on telemetry and is in sinus rhythm. Will continue to monitor.  Esperanza Heir, RN

## 2014-06-28 NOTE — Progress Notes (Signed)
[facility] PROGRESS NOTE Subjective Only complaint is constipation.  Objective: Vital signs in last 24 hours: Temp:  [97.9 F (36.6 C)-98.3 F (36.8 C)] 98 F (36.7 C) (11/08 0427) Pulse Rate:  [58-73] 72 (11/08 0427) Resp:  [20] 20 (11/08 0427) BP: (150-179)/(53-77) 166/77 mmHg (11/08 0427) SpO2:  [94 %-97 %] 96 % (11/08 0427) Weight:  [65.6 kg (144 lb 10 oz)] 65.6 kg (144 lb 10 oz) (11/08 0427) Last BM Date: 06/27/14  Intake/Output from previous day: 11/07 0701 - 11/08 0700 In: 240 [P.O.:240] Out: 2800 [Urine:2800] Intake/Output this shift:    PE: General--watching TV  Abdomen--soft still slight EG tenderness  Lab Results:  Recent Labs  06/26/14 1255 06/27/14 0436 06/28/14 0505  WBC 7.6 8.7 8.5  HGB 8.0* 9.6* 9.7*  HCT 26.1* 30.7* 31.4*  PLT 289 253 280   BMET  Recent Labs  06/26/14 1255 06/26/14 1906 06/28/14 0505  NA 139 139 145  K 3.6* 4.7 3.8  CL 101 101 103  CO2 24 25 29   CREATININE 1.49* 1.61* 1.57*   LFT  Recent Labs  06/26/14 1255 06/26/14 1906  PROT 6.3 6.4  AST 170* 156*  ALT 138* 141*  ALKPHOS 84 93  BILITOT 0.6 0.5   PT/INR No results for input(s): LABPROT, INR in the last 72 hours. PANCREAS No results for input(s): LIPASE in the last 72 hours.       Studies/Results: Dg Chest 2 View  06/26/2014   CLINICAL DATA:  Shortness of breath, chest pain  EXAM: CHEST  2 VIEW  COMPARISON:  04/29/2014  FINDINGS: New moderate-sized right and small left pleural effusions are noted, obscuring the lung bases. Heart size is at upper limits of normal. Bilateral hilar prominence with central tapering may suggest pulmonary arterial hypertension. Leftward curvature of the spine centered at T12 is again noted. Cholecystectomy clips are noted. A few interstitial Kerley B-lines are noted.  IMPRESSION: New moderate right and small left pleural effusions obscuring the lung bases. This could obscure underlying pneumonia and/or atelectasis.  Peripheral interstitial Kerley B-lines could indicate superimposed early interstitial edema.   Electronically Signed   By: Conchita Paris M.D.   On: 06/26/2014 14:04   US Abdomen Limited Ruq  06/27/2014   CLINICAL DATA:  Elevated LFTs, status post cholecystectomy  EXAM: US ABDOMEN LIMITED - RIGHT UPPER QUADRANT  COMPARISON:  L ultrasound dated 10/07/2009  FINDINGS: Gallbladder:  Surgically absent.  Common bile duct:  Diameter: Dilated, measuring 14 mm, unchanged from 2011.  Liver:  No focal lesion identified.  Coarse parenchymal echogenicity.  Additional comments: Mild perihepatic ascites. Right pleural effusion.  IMPRESSION: Status post cholecystectomy.  Common duct measures 14 mm, unchanged from 2011, likely postsurgical.  Mild perihepatic ascites.  Right pleural effusion.   Electronically Signed   By: Julian Hy M.D.   On: 06/27/2014 14:13    Medications: I have reviewed the patient's current medications.  Assessment/Plan: 1. + Stool/anemia. Still slight EG pain. ? EGD Tues. Had colon 10-15 years ago reports was ok. 2. Constipaion. Will give daily miralax.   Dana Blackwell,[name] 06/28/2014, 9:51 AM

## 2014-06-28 NOTE — Plan of Care (Signed)
Problem: Phase I Progression Outcomes Goal: Voiding-avoid urinary catheter unless indicated Outcome: Progressing Voiding without problems.  No catheter required.

## 2014-06-28 NOTE — Progress Notes (Addendum)
Patient Name: Dana Blackwell Date of Encounter: 06/28/2014  Principal Problem:   Anemia Active Problems:   Diabetes mellitus   Essential hypertension   COPD (chronic obstructive pulmonary disease)   Pulmonary HTN   CAD in native artery   Acute on chronic diastolic congestive heart failure   GI bleed   Absolute anemia   Primary Cardiologist: [facility]  Patient Profile: 80 y.o. female with a history of SAH, DM, HTN, Dyslipidemia, COPD/Pulm HTN on home O2, chronic diastolic CHF and NSTEMI. D/c 09/12 after DES CFX. Admitted 11/06 w/ anemia, GIB, CHF.  SUBJECTIVE: Tired from getting up/down all night to urinate. Doesn't feel much better today. Complains of constipation.   OBJECTIVE Filed Vitals:   06/27/14 1430 06/27/14 2135 06/28/14 0155 06/28/14 0427  BP: 179/61 152/73 155/61 166/77  Pulse: 59 73 66 72  Temp: 97.9 F (36.6 C) 98.3 F (36.8 C) 98 F (36.7 C) 98 F (36.7 C)  TempSrc: Oral Oral Oral Oral  Resp: 20 20 20 20   Height:      Weight:    144 lb 10 oz (65.6 kg)  SpO2: 97% 94% 95% 96%    Intake/Output Summary (Last 24 hours) at 06/28/14 0751 Last data filed at 06/28/14 0437  Gross per 24 hour  Intake    240 ml  Output   2800 ml  Net  -2560 ml   Filed Weights   06/26/14 1728 06/27/14 0439 06/28/14 0427  Weight: 151 lb 4.8 oz (68.629 kg) 148 lb 13 oz (67.5 kg) 144 lb 10 oz (65.6 kg)    PHYSICAL EXAM General: Well developed, well nourished, female in no acute distress. Head: Normocephalic, atraumatic.  Neck: Supple without bruits, JVD 10 cm. Lungs:  Resp regular and unlabored, rales bases, R>L, decreased BS bilaterally Heart: RRR, S1, S2, no S3, S4, or murmur; no rub. Abdomen: Soft, non-tender, non-distended, BS + x 4.  Extremities: No clubbing, cyanosis, trace edema.  Neuro: Alert and oriented X 3. Moves all extremities spontaneously. Psych: Normal affect.  LABS: CBC: Recent Labs  06/26/14 1255 06/27/14 0436 06/28/14 0505  WBC 7.6 8.7 8.5    NEUTROABS 5.1  --   --   HGB 8.0* 9.6* 9.7*  HCT 26.1* 30.7* 31.4*  MCV 84.2 84.6 84.6  PLT 289 253 182   Basic Metabolic Panel: Recent Labs  06/26/14 1906 06/28/14 0505  NA 139 145  K 4.7 3.8  CL 101 103  CO2 25 29  GLUCOSE 244* 110*  BUN 36* 30*  CREATININE 1.61* 1.57*  CALCIUM 8.6 8.8   Liver Function Tests: Recent Labs  06/26/14 1255 06/26/14 1906  AST 170* 156*  ALT 138* 141*  ALKPHOS 84 93  BILITOT 0.6 0.5  PROT 6.3 6.4  ALBUMIN 2.9* 3.0*    Recent Labs  06/26/14 1347  TROPIPOC 0.02   BNP: PRO B NATRIURETIC PEPTIDE (BNP)  Date/Time Value Ref Range Status  06/26/2014 01:30 PM 12174.0* 0 - 450 pg/mL Final  05/25/2014 03:50 PM 684.0* 0.0 - 100.0 pg/mL Final   Hemoglobin A1C: Recent Labs  06/26/14 1906  HGBA1C 7.5*   Anemia Panel: Recent Labs  06/26/14 1906  VITAMINB12 >2000*  FOLATE >20.0  FERRITIN 47  TIBC 370  IRON 16*  RETICCTPCT 2.1    TELE:   SR, no acute ischemic changes  Radiology/Studies: Dg Chest 2 View 06/26/2014   CLINICAL DATA:  Shortness of breath, chest pain  EXAM: CHEST  2 VIEW  COMPARISON:  04/29/2014  FINDINGS: New moderate-sized right and small left pleural effusions are noted, obscuring the lung bases. Heart size is at upper limits of normal. Bilateral hilar prominence with central tapering may suggest pulmonary arterial hypertension. Leftward curvature of the spine centered at T12 is again noted. Cholecystectomy clips are noted. A few interstitial Kerley B-lines are noted.  IMPRESSION: New moderate right and small left pleural effusions obscuring the lung bases. This could obscure underlying pneumonia and/or atelectasis. Peripheral interstitial Kerley B-lines could indicate superimposed early interstitial edema.   Electronically Signed   By: Conchita Paris M.D.   On: 06/26/2014 14:04   US Abdomen Limited Ruq 06/27/2014   CLINICAL DATA:  Elevated LFTs, status post cholecystectomy  EXAM: US ABDOMEN LIMITED - RIGHT UPPER  QUADRANT  COMPARISON:  L ultrasound dated 10/07/2009  FINDINGS: Gallbladder:  Surgically absent.  Common bile duct:  Diameter: Dilated, measuring 14 mm, unchanged from 2011.  Liver:  No focal lesion identified.  Coarse parenchymal echogenicity.  Additional comments: Mild perihepatic ascites. Right pleural effusion.  IMPRESSION: Status post cholecystectomy.  Common duct measures 14 mm, unchanged from 2011, likely postsurgical.  Mild perihepatic ascites.  Right pleural effusion.   Electronically Signed   By: Julian Hy M.D.   On: 06/27/2014 14:13     Current Medications:  . amLODipine  5 mg Oral Daily  . aspirin EC  81 mg Oral Daily  . cloNIDine  0.1 mg Oral BID  . furosemide  40 mg Intravenous Q12H  . insulin aspart  0-9 Units Subcutaneous TID WC  . insulin glargine  10 Units Subcutaneous QHS  . isosorbide mononitrate  60 mg Oral Daily  . levothyroxine  75 mcg Oral QAC breakfast  . metoprolol succinate  25 mg Oral Daily  . pantoprazole (PROTONIX) IV  40 mg Intravenous Q12H  . sertraline  100 mg Oral QPM      ASSESSMENT AND PLAN: Principal Problem:   Anemia - per IM, s/p transfusion and improved. Felt 2nd GIB  Active Problems:   Diabetes mellitus - per IM    Essential hypertension - ACE D/C'D 2nd renal insufficiency, on clonidine 0.1 mg bid, Imdur 60 mg, and Toprol XL 25 mg (no dose change w/ bradycardia) PTA. Norvasc 5 mg added 11/07. MD advise on increasing Norvasc or adding hydralazine.     COPD (chronic obstructive pulmonary disease) - per IM    Pulmonary HTN - see above, PAS 60 at echo in September    CAD in native artery - off Plavix, ASA only. S/p DES CFX 04/2014 w/ NSTEMI. On ASA/BB, restart statin when LFTs improve.    Acute on chronic diastolic congestive heart failure - still with extra volume on board, add foley cath, continue IV Rx.     GI bleed - per IM/GI    Absolute anemia - per IM/GI    CKD, stage III - Follow, BUN/CR 32/1.5 on 10/16, improved slightly  since admit, off ACE  Signed, Rosaria Ferries , PA-C 7:51 AM 06/28/2014   The patient was seen, examined and discussed with Rosaria Ferries, PA-C and I agree with the above.   Stable Hb, not clear what is GI planning to do EGD tomorrow. If ok by them she needs to be restarted on ASA/Plavix.  CHF - improving, diuresed 2.5 L overnight, Crea is stable, continue the same dose of diuretics as she is still volume overloaded.  Started on Dulcolax for constipation by GI.  Dorothy Spark 06/28/2014

## 2014-06-29 LAB — COMPREHENSIVE METABOLIC PANEL
ALBUMIN: 2.7 g/dL — AB (ref 3.5–5.2)
ALK PHOS: 80 U/L (ref 39–117)
ALT: 69 U/L — ABNORMAL HIGH (ref 0–35)
ANION GAP: 9 (ref 5–15)
AST: 48 U/L — AB (ref 0–37)
BILIRUBIN TOTAL: 0.8 mg/dL (ref 0.3–1.2)
BUN: 28 mg/dL — AB (ref 6–23)
CO2: 36 mEq/L — ABNORMAL HIGH (ref 19–32)
Calcium: 8.7 mg/dL (ref 8.4–10.5)
Chloride: 99 mEq/L (ref 96–112)
Creatinine, Ser: 1.2 mg/dL — ABNORMAL HIGH (ref 0.50–1.10)
GFR calc Af Amer: 47 mL/min — ABNORMAL LOW (ref >90)
GFR calc non Af Amer: 40 mL/min — ABNORMAL LOW (ref >90)
Glucose, Bld: 138 mg/dL — ABNORMAL HIGH (ref 70–99)
POTASSIUM: 3.5 meq/L — AB (ref 3.7–5.3)
Sodium: 144 mEq/L (ref 137–147)
TOTAL PROTEIN: 6 g/dL (ref 6.0–8.3)

## 2014-06-29 LAB — CBC
HEMATOCRIT: 31.5 % — AB (ref 36.0–46.0)
Hemoglobin: 9.8 g/dL — ABNORMAL LOW (ref 12.0–15.0)
MCH: 26.4 pg (ref 26.0–34.0)
MCHC: 31.1 g/dL (ref 30.0–36.0)
MCV: 84.9 fL (ref 78.0–100.0)
Platelets: 262 10*3/uL (ref 150–400)
RBC: 3.71 MIL/uL — ABNORMAL LOW (ref 3.87–5.11)
RDW: 17.4 % — AB (ref 11.5–15.5)
WBC: 8.2 10*3/uL (ref 4.0–10.5)

## 2014-06-29 LAB — GLUCOSE, CAPILLARY
Glucose-Capillary: 113 mg/dL — ABNORMAL HIGH (ref 70–99)
Glucose-Capillary: 251 mg/dL — ABNORMAL HIGH (ref 70–99)
Glucose-Capillary: 110 mg/dL — ABNORMAL HIGH (ref 70–99)
Glucose-Capillary: 233 mg/dL — ABNORMAL HIGH (ref 70–99)

## 2014-06-29 MED ORDER — SODIUM CHLORIDE 0.9 % IV SOLN
INTRAVENOUS | Status: DC
  Administered 2014-06-30: 07:00:00 via INTRAVENOUS

## 2014-06-29 MED ORDER — POTASSIUM CHLORIDE CRYS ER 20 MEQ PO TBCR
40.0000 meq | EXTENDED_RELEASE_TABLET | Freq: Once | ORAL | Status: AC
  Administered 2014-06-29: 40 meq via ORAL
  Filled 2014-06-29: qty 2

## 2014-06-29 NOTE — Progress Notes (Signed)
[facility] PROGRESS NOTE Subjective Patient clinically feeling better. She is eating well but still having some vague epigastric pain. Her stools were traced positive.  Objective: Vital signs in last 24 hours: Temp:  [97.7 F (36.5 C)-98 F (36.7 C)] 97.7 F (36.5 C) (11/09 0375) Pulse Rate:  [62-68] 62 (11/09 0642) Resp:  [17-20] 17 (11/09 0642) BP: (140-162)/(55-70) 162/55 mmHg (11/09 0642) SpO2:  [96 %] 96 % (11/09 0642) Weight:  [62 kg (136 lb 11 oz)] 62 kg (136 lb 11 oz) (11/09 0642) Last BM Date: 06/27/14  Intake/Output from previous day: 11/08 0701 - 11/09 0700 In: 940 [P.O.:940] Out: 3450 [Urine:3450] Intake/Output this shift: Total I/O In: 360 [P.O.:360] Out: -   PE: General--patient finishing breakfast in no distress. Ate her entire breakfast.  Abdomen--soft and nontender minimal epigastric tenderness.  Lab Results:  Recent Labs  06/26/14 1255 06/27/14 0436 06/28/14 0505 06/29/14 0400  WBC 7.6 8.7 8.5 8.2  HGB 8.0* 9.6* 9.7* 9.8*  HCT 26.1* 30.7* 31.4* 31.5*  PLT 289 253 280 262   BMET  Recent Labs  06/26/14 1255 06/26/14 1906 06/28/14 0505 06/29/14 0400  NA 139 139 145 144  K 3.6* 4.7 3.8 3.5*  CL 101 101 103 99  CO2 24 25 29  36*  CREATININE 1.49* 1.61* 1.57* 1.20*   LFT  Recent Labs  06/26/14 1255 06/26/14 1906 06/29/14 0400  PROT 6.3 6.4 6.0  AST 170* 156* 48*  ALT 138* 141* 69*  ALKPHOS 84 93 80  BILITOT 0.6 0.5 0.8   PT/INR No results for input(s): LABPROT, INR in the last 72 hours. PANCREAS No results for input(s): LIPASE in the last 72 hours.       Studies/Results: US Abdomen Limited Ruq  06/27/2014   CLINICAL DATA:  Elevated LFTs, status post cholecystectomy  EXAM: US ABDOMEN LIMITED - RIGHT UPPER QUADRANT  COMPARISON:  L ultrasound dated 10/07/2009  FINDINGS: Gallbladder:  Surgically absent.  Common bile duct:  Diameter: Dilated, measuring 14 mm, unchanged from 2011.  Liver:  No focal lesion identified.   Coarse parenchymal echogenicity.  Additional comments: Mild perihepatic ascites. Right pleural effusion.  IMPRESSION: Status post cholecystectomy.  Common duct measures 14 mm, unchanged from 2011, likely postsurgical.  Mild perihepatic ascites.  Right pleural effusion.   Electronically Signed   By: Julian Hy M.D.   On: 06/27/2014 14:13    Medications: I have reviewed the patient's current medications.  Assessment/Plan: 1. Epigastric tenderness/heme positive stool. We'll go ahead and start with EGD in the morning. This is scheduled for 830 tentatively. We have discussed this with the patient.   Chanin Frumkin JR,Vernis Eid L 06/29/2014, 10:02 AM

## 2014-06-29 NOTE — Care Management Note (Signed)
    Page 1 of 1   06/29/2014     2:04:27 PM CARE MANAGEMENT NOTE 06/29/2014  Patient:  Dana Blackwell, Dana Blackwell   Account Number:  [id number]  Date Initiated:  06/29/2014  Documentation initiated by:  Khiara Shuping  Subjective/Objective Assessment:   anemia, Hbg 8.0     Action/Plan:   CM to follow for disposition needs   Anticipated DC Date:  06/30/2014   Anticipated DC Plan:  [place]  CM consult      [facility] Choice  HOME HEALTH   Choice offered to / List presented to:             Status of service:  In process, will continue to follow Medicare Important Message given?  YES (If response is "NO", the following Medicare IM given date fields will be blank) Date Medicare IM given:  06/29/2014 Medicare IM given by:  Glee Lashomb Date Additional Medicare IM given:   Additional Medicare IM given by:    Discharge Disposition:  [place]  Per UR Regulation:  Reviewed for med. necessity/level of care/duration of stay  If discussed at [place] of Stay Meetings, dates discussed:    Comments:  Mariadejesus Cade RN, BSN, MSHL, CCM  Nurse - Case Manager,  ([place])  [phone]  06/18/2014 Social:  From Home alone.  Hx/o 3 admissions over the past 3 months. PT RECS:  pending Dispostiion Plan:  Home with HHS (high readmission risk) - dispo plan in progress

## 2014-06-29 NOTE — Progress Notes (Signed)
UR completed (Retro admission)  Charlesetta Milliron K. Leonarda Leis, RN, BSN, [place], CCM  06/29/2014 2:05 PM

## 2014-06-29 NOTE — Progress Notes (Signed)
[facility] Progress Note   Dana Blackwell QMG:[id number] DOB: [birth year] DOA: 06/26/2014 PCP: [name], MD  Brief narrative: Dana Blackwell is a 80 y.o. female with PMH of CAD, recent NSTEMI s/p PTCA of the LAD and subsequent DES stent to left circumflex in 9/15, DM, HTN, Dyslipidemia, COPD/Pulm HTN on home O2, chronic diastolic CHF, presents to the ER with weakness and dyspnea. She is found to be anemic with heme positive stools and fluid overloaded.    Subjective: Abdominal pain continues to improve. No dyspnea.   Assessment/Plan: Principal Problem:  Anemia/normocytic  -trace heme positive stools -anemia panel reveals low Iron levels and low normal Ferretin -reports colonoscopy >10-15 years ago, cannot recall provider, EUS per Dr.Outlaw in 2011 -transfused 1 unit PRBC bringing Hb up from 8 to 9.6 -[facility] consulted- due to significant epigastric pain, GI considering EGD-will undergo EGD on 11/10 - pain improved significantly on PPI BID  Acute on chronic diastolic CHF - cont IV lasix today 40mg  q12 - follow I/Os, weights - last ECHo with EF 50-55% in 9/15 - continue Metoprolol and low dose ACE - Cardiology has evaluated   CKD 3 - stable- follow with diuresis  Elevated LFTs -denies any ETOH use -normal LFTs in 2011, none checked since, so recent baseline unknown -stopped statin -abdominal ultrasound reveals coarse liver parenchymal echogenicity -could be due to passive hepatic congestion from CHF -recheck tomorrow   CAD, recent NSTEMI and PTCI of LAD and DES to circumflex -cont  ASA/ - OK to hold plavix per cardiology for now until cause of bleeding is found - cont metoprolol -hold statin   Diabetes mellitus -continue lantus, increase SSI to moderate as sugars uncontrolled in the afternoon and evening - A1c 7.5  Essential hypertension -cotninue ACE, imdur and metoprolol  COPD (chronic obstructive pulmonary disease)/ Pulmonary  HTN -stable     Code Status: Full code Family Communication:  Disposition Plan: to be determined DVT prophylaxis: SCDs  Consultants: GI Cardiology  Procedures: none  Antibiotics: Anti-infectives    None      Objective: Filed Weights   06/27/14 0439 06/28/14 0427 06/29/14 0642  Weight: 67.5 kg (148 lb 13 oz) 65.6 kg (144 lb 10 oz) 62 kg (136 lb 11 oz)    Intake/Output Summary (Last 24 hours) at 06/29/14 1208 Last data filed at 06/29/14 0933  Gross per 24 hour  Intake   1060 ml  Output   2650 ml  Net  -1590 ml     Vitals Filed Vitals:   06/28/14 2111 06/29/14 0642 06/29/14 1055 06/29/14 1058  BP: 140/70 162/55 144/59   Pulse: 68 62  67  Temp: 98 F (36.7 C) 97.7 F (36.5 C)    TempSrc: Oral Oral    Resp: 18 17    Height:      Weight:  62 kg (136 lb 11 oz)    SpO2: 96% 96%      Exam: General: No acute respiratory distress- AAO x 3  Lungs: crackles in b/l lower lobes-  Cardiovascular: Regular rate and rhythm without murmur gallop or rub normal S1 and S2 Abdomen: less tender in epigastrium, nondistended, soft, bowel sounds positive, no rebound, no ascites, no appreciable mass Extremities: No significant cyanosis, clubbing, or edema bilateral lower extremities  Data Reviewed: Basic Metabolic Panel:  Recent Labs Lab 06/26/14 1255 06/26/14 1906 06/28/14 0505 06/29/14 0400  NA 139 139 145 144  K 3.6* 4.7 3.8 3.5*  CL 101 101 103 99  CO2 24 25 29  36*  GLUCOSE 129* 244* 110* 138*  BUN 37* 36* 30* 28*  CREATININE 1.49* 1.61* 1.57* 1.20*  CALCIUM 8.4 8.6 8.8 8.7   Liver Function Tests:  Recent Labs Lab 06/26/14 1255 06/26/14 1906 06/29/14 0400  AST 170* 156* 48*  ALT 138* 141* 69*  ALKPHOS 84 93 80  BILITOT 0.6 0.5 0.8  PROT 6.3 6.4 6.0  ALBUMIN 2.9* 3.0* 2.7*   No results for input(s): LIPASE, AMYLASE in the last 168 hours. No results for input(s): AMMONIA in the last 168 hours. CBC:  Recent Labs Lab 06/26/14 1255 06/27/14 0436  06/28/14 0505 06/29/14 0400  WBC 7.6 8.7 8.5 8.2  NEUTROABS 5.1  --   --   --   HGB 8.0* 9.6* 9.7* 9.8*  HCT 26.1* 30.7* 31.4* 31.5*  MCV 84.2 84.6 84.6 84.9  PLT 289 253 280 262   Cardiac Enzymes: No results for input(s): CKTOTAL, CKMB, CKMBINDEX, TROPONINI in the last 168 hours. BNP (last 3 results)  Recent Labs  04/13/14 1131 05/25/14 1550 06/26/14 1330  PROBNP 351.0* 684.0* 12174.0*   CBG:  Recent Labs Lab 06/28/14 1128 06/28/14 1706 06/28/14 2155 06/29/14 0614 06/29/14 1114  GLUCAP 273* 210* 140* 113* 251*    No results found for this or any previous visit (from the past 240 hour(s)).   Studies:  Recent x-ray studies have been reviewed in detail by the Attending Physician  Scheduled Meds:  Scheduled Meds: . amLODipine  5 mg Oral Daily  . aspirin EC  81 mg Oral Daily  . cloNIDine  0.1 mg Oral BID  . furosemide  40 mg Intravenous BID  . insulin aspart  0-15 Units Subcutaneous TID WC  . insulin aspart  0-5 Units Subcutaneous QHS  . insulin glargine  10 Units Subcutaneous QHS  . isosorbide mononitrate  60 mg Oral Daily  . levothyroxine  75 mcg Oral QAC breakfast  . metoprolol succinate  25 mg Oral Daily  . pantoprazole (PROTONIX) IV  40 mg Intravenous Q12H  . polyethylene glycol  17 g Oral BID  . sertraline  100 mg Oral QPM   Continuous Infusions:   Time spent on care of this patient: 43 min   McConnell, MD 06/29/2014, 12:08 PM  LOS: 3 days   [facility] Office  [phone] Pager - Text Page per [email or web address]  If 7PM-7AM, please contact night-coverage [email or web address]

## 2014-06-29 NOTE — Plan of Care (Signed)
Problem: Phase I Progression Outcomes Goal: Voiding-avoid urinary catheter unless indicated Outcome: Completed/Met Date Met:  06/29/14     

## 2014-06-29 NOTE — Clinical Documentation Improvement (Signed)
06/29/14  MD's, NP's, and PA's  Noted on admit patient with H/H of (8.0, 9.6, 9.7, 9.8/26.1, 30.7, 31.4, 31.5) patient transfused with 1 unit PRBC in setting of Gastrointestinal Bleed.  If either condition below is appropriate please document in notes.  Thank you     Possible Clinical Conditions?    Expected Acute Blood Loss Anemia   Acute on chronic blood loss anemia   Normocytic anemia   Iron deficiency Anemia   Pernicious Anemia   Other Condition   Cannot Clinically Determine    H&H on admit: noted above  Current H&H: noted above  Transfusion: PRBC 1 Unit   IV fluids : IV NS  Serial H&H monitoring : CBC x 3   Thank You, Ree Kida ,RN Clinical Documentation Specialist:  [place] Information Management

## 2014-06-29 NOTE — Progress Notes (Signed)
Inpatient Diabetes Program Recommendations  AACE/ADA: New Consensus Statement on Inpatient Glycemic Control (2013)  Target Ranges:  Prepandial:   less than 140 mg/dL      Peak postprandial:   less than 180 mg/dL (1-2 hours)      Critically ill patients:  140 - 180 mg/dL   Reason for Assessment:  Results for Dana Blackwell, Dana Blackwell (MRN [id number]) as of 06/29/2014 16:19  Ref. Range 06/29/2014 06:14 06/29/2014 11:14  Glucose-Capillary Latest Range: 70-99 mg/dL 113 (H) 251 (H)   Diabetes history: Diabetes Mellitus Outpatient Diabetes medications: Lantus 10 units daily, Humalog 4-5 units tid with meals Current orders for Inpatient glycemic control:  Lantus 10 units daily, Novolog moderate tid with meals and HS  May consider adding Novolog meal coverage 3 units tid with meals (Hold if patient eats less than 50% or NPO).    Thanks, Adah Perl, RN, BC-ADM Inpatient Diabetes Coordinator Pager [phone]

## 2014-06-29 NOTE — Progress Notes (Signed)
Patient Profile: 80 y.o. female with a history of SAH, DM, HTN, Dyslipidemia, COPD/Pulm HTN on home O2, chronic diastolic CHF and NSTEMI. D/c 09/12 after DES CFX. Admitted 11/06 w/ anemia, GIB, CHF.  Subjective: Feels significantly better today. Dyspnea improved. Denies chest pain.   Objective: Vital signs in last 24 hours: Temp:  [97.7 F (36.5 C)-98 F (36.7 C)] 97.7 F (36.5 C) (11/09 4742) Pulse Rate:  [62-68] 62 (11/09 0642) Resp:  [17-20] 17 (11/09 0642) BP: (140-162)/(55-70) 162/55 mmHg (11/09 0642) SpO2:  [96 %] 96 % (11/09 0642) Weight:  [136 lb 11 oz (62 kg)] 136 lb 11 oz (62 kg) (11/09 0642) Last BM Date: 06/27/14  Intake/Output from previous day: 11/08 0701 - 11/09 0700 In: 940 [P.O.:940] Out: 3450 [Urine:3450] Intake/Output this shift: Total I/O In: 360 [P.O.:360] Out: -   Medications Current Facility-Administered Medications  Medication Dose Route Frequency Provider Last Rate Last Dose  . acetaminophen (TYLENOL) tablet 650 mg  650 mg Oral Q6H PRN [name], MD       Or  . acetaminophen (TYLENOL) suppository 650 mg  650 mg Rectal Q6H PRN [name], MD      . amLODipine (NORVASC) tablet 5 mg  5 mg Oral Daily Dorothy Spark, MD   5 mg at 06/28/14 1110  . aspirin EC tablet 81 mg  81 mg Oral Daily [name], MD   81 mg at 06/28/14 1110  . bisacodyl (DULCOLAX) EC tablet 10 mg  10 mg Oral Daily PRN Debbe Odea, MD      . cloNIDine (CATAPRES) tablet 0.1 mg  0.1 mg Oral BID [name], MD   0.1 mg at 06/28/14 2146  . furosemide (LASIX) injection 40 mg  40 mg Intravenous BID Debbe Odea, MD   40 mg at 06/29/14 1043  . hydrALAZINE (APRESOLINE) injection 10 mg  10 mg Intravenous Q6H PRN Dianne Dun, NP   10 mg at 06/27/14 0646  . HYDROcodone-acetaminophen (NORCO/VICODIN) 5-325 MG per tablet 1 tablet  1 tablet Oral Q6H PRN [name], MD   1 tablet at 06/26/14 2350  . insulin aspart (novoLOG) injection 0-15 Units  0-15 Units  Subcutaneous TID WC Debbe Odea, MD   5 Units at 06/28/14 1720  . insulin aspart (novoLOG) injection 0-5 Units  0-5 Units Subcutaneous QHS Debbe Odea, MD   0 Units at 06/28/14 2200  . insulin glargine (LANTUS) injection 10 Units  10 Units Subcutaneous QHS [name], MD   10 Units at 06/28/14 2146  . isosorbide mononitrate (IMDUR) 24 hr tablet 60 mg  60 mg Oral Daily [name], MD   60 mg at 06/28/14 1110  . levothyroxine (SYNTHROID, LEVOTHROID) tablet 75 mcg  75 mcg Oral QAC breakfast [name], MD   75 mcg at 06/29/14 0641  . metoprolol succinate (TOPROL-XL) 24 hr tablet 25 mg  25 mg Oral Daily [name], MD   25 mg at 06/28/14 1112  . nitroGLYCERIN (NITROSTAT) SL tablet 0.4 mg  0.4 mg Sublingual Q5 min PRN [name], MD      . ondansetron [facility]) tablet 4 mg  4 mg Oral Q6H PRN [name], MD       Or  . ondansetron (ZOFRAN) injection 4 mg  4 mg Intravenous Q6H PRN [name], MD      . pantoprazole (PROTONIX) injection 40 mg  40 mg Intravenous Q12H [name], MD   40 mg at 06/28/14 2146  . polyethylene glycol (MIRALAX / GLYCOLAX)  packet 17 g  17 g Oral BID Winfield Cunas., MD   17 g at 06/28/14 2146  . sertraline (ZOLOFT) tablet 100 mg  100 mg Oral QPM [name], MD   100 mg at 06/28/14 1807  . traMADol (ULTRAM) tablet 50-100 mg  50-100 mg Oral Q6H PRN [name], MD   50 mg at 06/28/14 2148    PE: General appearance: alert, cooperative and no distress Neck: no carotid bruit and no JVD Lungs: faint basilar rales on the right Heart: regular rate and rhythm Extremities: no LEE Pulses: 2+ and symmetric Skin: warm and dry Neurologic: Grossly normal  Lab Results:   Recent Labs  06/27/14 0436 06/28/14 0505 06/29/14 0400  WBC 8.7 8.5 8.2  HGB 9.6* 9.7* 9.8*  HCT 30.7* 31.4* 31.5*  PLT 253 280 262   BMET  Recent Labs  06/26/14 1906 06/28/14 0505 06/29/14 0400  NA 139 145 144  K 4.7 3.8 3.5*  CL 101 103 99  CO2 25 29 36*    GLUCOSE 244* 110* 138*  BUN 36* 30* 28*  CREATININE 1.61* 1.57* 1.20*  CALCIUM 8.6 8.8 8.7    Assessment/Plan  Principal Problem:   Anemia Active Problems:   Diabetes mellitus   Essential hypertension   COPD (chronic obstructive pulmonary disease)   Pulmonary HTN   CAD in native artery   Acute on chronic diastolic congestive heart failure   GI bleed   Absolute anemia   CKD (chronic kidney disease), stage III   Congestive heart disease   1. GIB: Anemia and hem + stools in the setting of DAPT for recently placed DES to CFX 04/2014. Plavix currently on hold but still on low dose ASA. EGD planned for tomorrow. Hgb remains stable at 9.8. Will await GI's recommendations. Already on a PPI.   2. CAD: s/p recent NSTEMI 04/2014 with PCI +DES to CFX. Currently on low dose ASA. ? Restart of Plavix after EGD tomorrow. Continue BB and nitrate.   3. Acute on Chronic Diastolic CHF: EF 71-69% on recent echo 04/2014. She has diuresed well. -6.3 L since admit. Breathing improved. Not significantly volume overloaded. Can convert back to PO diuretic later today or tomorrow. Continue BB.     LOS: 3 days    Brittainy M. Ladoris Gene 06/29/2014 10:46 AM   I have personally seen and examined this patient with Lyda Jester, PA-C.  I agree with the assessment and plan as outlined above. Pt admitted over weekend with GI bleeding. DES placed Circumflex 8 weeks ago. Plavix is on hold. Currently on ASA only per plans of the weekend team. We have to be very careful holding Plavix this close to placement of DES. If no evidence of upper GI bleeding source tomorrow on EGD, would start Plavix as soon as possible when felt to be safe from GI standpoint.   MCALHANY,CHRISTOPHER 06/29/2014 1:08 PM'

## 2014-06-30 ENCOUNTER — Encounter (HOSPITAL_COMMUNITY)
Admission: EM | Disposition: A | Discharge status: Home / Self Care | Payer: Self-pay | Source: Home / Self Care | Attending: Internal Medicine

## 2014-06-30 ENCOUNTER — Encounter (HOSPITAL_COMMUNITY): Payer: Self-pay | Admitting: Gastroenterology

## 2014-06-30 DIAGNOSIS — K922 Gastrointestinal hemorrhage, unspecified: Secondary | ICD-10-CM

## 2014-06-30 HISTORY — PX: ESOPHAGOGASTRODUODENOSCOPY: SHX5428

## 2014-06-30 LAB — GLUCOSE, CAPILLARY
GLUCOSE-CAPILLARY: 89 mg/dL (ref 70–99)
GLUCOSE-CAPILLARY: 323 mg/dL — AB (ref 70–99)
GLUCOSE-CAPILLARY: 285 mg/dL — AB (ref 70–99)
GLUCOSE-CAPILLARY: 175 mg/dL — AB (ref 70–99)
Glucose-Capillary: 177 mg/dL — ABNORMAL HIGH (ref 70–99)

## 2014-06-30 LAB — CBC
HCT: 34.5 % — ABNORMAL LOW (ref 36.0–46.0)
HEMOGLOBIN: 10.5 g/dL — AB (ref 12.0–15.0)
MCH: 26.2 pg (ref 26.0–34.0)
MCHC: 30.4 g/dL (ref 30.0–36.0)
MCV: 86 fL (ref 78.0–100.0)
Platelets: 264 10*3/uL (ref 150–400)
RBC: 4.01 MIL/uL (ref 3.87–5.11)
RDW: 17.3 % — ABNORMAL HIGH (ref 11.5–15.5)
WBC: 7.9 10*3/uL (ref 4.0–10.5)

## 2014-06-30 SURGERY — EGD (ESOPHAGOGASTRODUODENOSCOPY)
Anesthesia: Moderate Sedation

## 2014-06-30 MED ORDER — BISACODYL 10 MG RE SUPP
10.0000 mg | Freq: Once | RECTAL | Status: AC
Start: 2014-06-30 — End: 2014-06-30
  Administered 2014-06-30: 10 mg via RECTAL
  Filled 2014-06-30: qty 1

## 2014-06-30 MED ORDER — BUTAMBEN-TETRACAINE-BENZOCAINE 2-2-14 % EX AERO
INHALATION_SPRAY | CUTANEOUS | Status: DC | PRN
Start: 2014-06-30 — End: 2014-06-30
  Administered 2014-06-30: 2 via TOPICAL

## 2014-06-30 MED ORDER — MIDAZOLAM HCL 5 MG/ML IJ SOLN
INTRAMUSCULAR | Status: AC
  Filled 2014-06-30: qty 2

## 2014-06-30 MED ORDER — FUROSEMIDE 40 MG PO TABS
40.0000 mg | ORAL_TABLET | Freq: Every day | ORAL | Status: DC
  Administered 2014-07-01 – 2014-07-04 (×4): 40 mg via ORAL
  Filled 2014-06-30 (×4): qty 1

## 2014-06-30 MED ORDER — FENTANYL CITRATE 0.05 MG/ML IJ SOLN
INTRAMUSCULAR | Status: AC
  Filled 2014-06-30: qty 2

## 2014-06-30 MED ORDER — FENTANYL CITRATE 0.05 MG/ML IJ SOLN
INTRAMUSCULAR | Status: DC | PRN
  Administered 2014-06-30: 25 ug via INTRAVENOUS

## 2014-06-30 MED ORDER — POLYETHYLENE GLYCOL 3350 17 G PO PACK
17.0000 g | PACK | Freq: Four times a day (QID) | ORAL | Status: DC
  Administered 2014-06-30 – 2014-07-03 (×13): 17 g via ORAL
  Filled 2014-06-30 (×20): qty 1

## 2014-06-30 MED ORDER — MIDAZOLAM HCL 10 MG/2ML IJ SOLN
INTRAMUSCULAR | Status: DC | PRN
  Administered 2014-06-30: 2 mg via INTRAVENOUS

## 2014-06-30 MED ORDER — DIPHENHYDRAMINE HCL 50 MG/ML IJ SOLN
INTRAMUSCULAR | Status: AC
  Filled 2014-06-30: qty 1

## 2014-06-30 NOTE — Plan of Care (Signed)
Problem: Phase II Progression Outcomes Goal: Walk in hall or up in chair TID Outcome: Progressing Pt ambulated with Pt today. See notes

## 2014-06-30 NOTE — Op Note (Signed)
[facility] [place], [id number]   ENDOSCOPY PROCEDURE REPORT  PATIENT: Dana, Blackwell  MR#: [id number] BIRTHDATE: January 30, 1928 , 49  yrs. old GENDER: female ENDOSCOPIST: Laurence Spates, MD REFERRED BY:  [name], M.D. PROCEDURE DATE:  2014/07/29 PROCEDURE:  EGD, diagnostic ASA CLASS:     Class IV INDICATIONS:  epigastric pain, heme positive stools and anemia. MEDICATIONS: Fentanyl 25 mcg IV and Versed 2 mg IV TOPICAL ANESTHETIC: Cetacaine Spray  DESCRIPTION OF PROCEDURE: After the risks benefits and alternatives of the procedure were thoroughly explained, informed consent was obtained.  The Pentax Gastroscope [id number] endoscope was introduced through the mouth and advanced to the second portion of the duodenum , Without limitations.  The instrument was slowly withdrawn as the mucosa was fully examined.      ESOPHAGUS: The mucosa of the esophagus appeared normal.  STOMACH: The mucosa of the stomach appeared normal.  DUODENUM: The duodenal mucosa showed no abnormalities.  Retroflexed views revealed no abnormalities.     The scope was then withdrawn from the patient and the procedure completed.  COMPLICATIONS: There were no immediate complications.  ENDOSCOPIC IMPRESSION: 1.   The mucosa of the esophagus appeared normal 2.   The mucosa of the stomach appeared normal 3.   The duodenal mucosa showed no abnormalities 4.    No explanation for epigastric pain or heme positive stools on EGD  RECOMMENDATIONS: We will follow clinically and check hemoglobin as well as iron studies.  REPEAT EXAM:  eSigned:  Laurence Spates, MD 2014-07-29 9:15 AM    CC:  CPT CODES: ICD CODES:  The ICD and CPT codes recommended by this software are interpretations from the data that the clinical staff has captured with the software.  The verification of the translation of this report to the ICD and CPT codes and modifiers is the  sole responsibility of the health care institution and practicing physician where this report was generated.  [employer] will not be held responsible for the validity of the ICD and CPT codes included on this report.  [employer] assumes no liability for data contained or not contained herein. CPT is a Designer, television/film set of the [employer].  PATIENT NAME:  Dana, Blackwell MR#: [id number]

## 2014-06-30 NOTE — H&P (View-Only) (Signed)
[facility] CON[facility] Reason for consult:positive stools and anemia Referring Physician: [facility]. PCP: Dr. Maudie Mercury. G.I.: Dr. Georgana Curio Dana Blackwell is an 80 y.o. female.  HPI: she was admitted after being found to be anemic and have elevated LFTs. She is Post cholecystectomy and had elevated LFTs 4 years ago. She saw Dr. Paulita Fujita and she underwent EUS and workup for elevated LFTs. She had a dilated CBD but no common duct stones at that time.the patient's LFTs remain minimally elevated but total bilirubin is normal. Hemoglobin was low at 8.0 and stools were traced positive. Were asked to see her regarding these issues. She reports that she has not seen any bright blood in her stool, and reports that she has been having epigastric pain for approximately 4 to 6 weeks intermittently. Long discussion with her about this pain difficult to get a clear-cut answer regarding the nature of the pain.patient has an extensive cardiac history and has had recent NSTEMI's with angioplasty and stent. She has had COPD with pulmonary hypertension in his own home 02.she adamantly denies taking any NSAIDs that she purchases over-the-counter. She states they had kept her off of all anticoagulation or antiplatelet agents until recently when she was started back on aspirin by her cardiologist.she does take Vicodin for various aches and pains.she was started recently on protonix and doesn't know if this is helped her epigastric pain or not. It's not clear if this pain gets better or worse with eating. The patient reports that she had a colonoscopy by Dr. Collene Mares 10 years ago or more tells me that it was "ok". Past Medical History  Diagnosis Date  . Coronary artery disease     11/2012: s/p 3.0 x 28 mm Xience and 2.5 x 28 mm Xience stents to the LAD    . Hypertension   . Hyperlipidemia   . Stroke     hx of  . Obesity   . Heart murmur   . Diabetes mellitus     type II  . Hypothyroidism   . Esophageal  stricture   . Gastritis   . Nausea and vomiting   . Diarrhea   . Abdominal pain   . Esophageal reflux   . Depression   . Uterine cancer   . H/O: hysterectomy     Past Surgical History  Procedure Laterality Date  . Bladder surgery      tacking  . Cervical disc surgery    . Cholecystectomy    . Total abdominal hysterectomy      Family History  Problem Relation Age of Onset  . Heart attack Mother 59    died  . Coronary artery disease      family hx on  father side  . Heart attack Brother   . Heart attack Sister     Social History:  reports that she has never smoked. She has never used smokeless tobacco. She reports that she does not drink alcohol or use illicit drugs.  Allergies:  Allergies  Allergen Reactions  . Tetanus Toxoid Other (See Comments)    unknown    Medications; Prior to Admission medications   Medication Sig Start Date End Date Taking? Authorizing Provider  aspirin 81 MG tablet Take 81 mg by mouth daily.   Yes Historical Provider, MD  atorvastatin (LIPITOR) 20 MG tablet Take 1 tablet (20 mg total) by mouth daily at 6 PM. 05/25/14  Yes Liliane Shi, PA-C  beta carotene w/minerals (OCUVITE) tablet Take 1 tablet by  mouth at bedtime.    Yes Historical Provider, MD  calcium carbonate (OS-CAL) 600 MG TABS tablet Take 600 mg by mouth daily.   Yes Historical Provider, MD  cloNIDine (CATAPRES) 0.1 MG tablet Take 1 tablet (0.1 mg total) by mouth 2 (two) times daily. 05/25/14  Yes Scott T Kathlen Mody, PA-C  Cyanocobalamin (VITAMIN B-12 PO) Take 1 tablet by mouth daily.   Yes Historical Provider, MD  Docusate Calcium (STOOL SOFTENER PO) Take 1 tablet by mouth 2 (two) times daily as needed (constipation).   Yes Historical Provider, MD  folic acid-pyridoxine-cyancobalamin (FOLTX) 2.5-25-2 MG TABS Take 1 tablet by mouth daily.    Yes Historical Provider, MD  furosemide (LASIX) 40 MG tablet Take 1 tablet (40 mg total) by mouth daily. 05/25/14  Yes Liliane Shi, PA-C   HYDROcodone-acetaminophen (NORCO/VICODIN) 5-325 MG per tablet Take 1 tablet by mouth every 6 (six) hours as needed for moderate pain. 02/27/14  Yes Adeline C Viyuoh, MD  insulin glargine (LANTUS) 100 UNIT/ML injection Inject 0.1 mLs (10 Units total) into the skin at bedtime. 11/28/13  Yes Rhonda G Barrett, PA-C  insulin lispro (HUMALOG) 100 UNIT/ML injection Inject 4-5 Units into the skin 3 (three) times daily before meals.   Yes Historical Provider, MD  isosorbide mononitrate (IMDUR) 60 MG 24 hr tablet Take 1 tablet (60 mg total) by mouth daily. 04/22/14  Yes Scott Joylene Draft, PA-C  levothyroxine (SYNTHROID, LEVOTHROID) 75 MCG tablet Take 75 mcg by mouth daily.     Yes Historical Provider, MD  lisinopril (PRINIVIL,ZESTRIL) 5 MG tablet Take 1 tablet (5 mg total) by mouth daily. 05/25/14  Yes Liliane Shi, PA-C  metoprolol succinate (TOPROL XL) 25 MG 24 hr tablet Take 1 tablet (25 mg total) by mouth daily. 05/25/14  Yes Liliane Shi, PA-C  nitroGLYCERIN (NITROSTAT) 0.4 MG SL tablet Place 1 tablet (0.4 mg total) under the tongue every 5 (five) minutes as needed for chest pain. 05/25/14  Yes Liliane Shi, PA-C  Omega 3-6-9 CAPS Take 1 capsule by mouth daily.   Yes Historical Provider, MD  pantoprazole (PROTONIX) 40 MG tablet Take 1 tablet (40 mg total) by mouth daily. Patient taking differently: Take 40 mg by mouth 2 (two) times daily.  05/25/14  Yes Liliane Shi, PA-C  sertraline (ZOLOFT) 100 MG tablet Take 100 mg by mouth every evening.    Yes Historical Provider, MD  traMADol (ULTRAM) 50 MG tablet Take 1-2 tablets (50-100 mg total) by mouth every 6 (six) hours as needed (8m for mild pain, 760mfor moderate pain, 10062mor severe pain). 02/27/14  Yes Adeline C VSaralyn PilarD  acetaminophen (TYLENOL) 650 MG CR tablet Take 1,300 mg by mouth at bedtime.     Historical Provider, MD  clopidogrel (PLAVIX) 75 MG tablet Take 1 tablet (75 mg total) by mouth daily. 05/25/14   ScoLiliane ShiA-C  fish oil-omega-3  fatty acids 1000 MG capsule Take 1 g by mouth daily.     Historical Provider, MD  MAGNESIUM PO Take 1 tablet by mouth at bedtime.     Historical Provider, MD   . aspirin EC  81 mg Oral Daily  . cloNIDine  0.1 mg Oral BID  . clopidogrel  75 mg Oral Daily  . furosemide  40 mg Intravenous Q12H  . insulin aspart  0-9 Units Subcutaneous TID WC  . insulin glargine  10 Units Subcutaneous QHS  . isosorbide mononitrate  60 mg Oral Daily  . levothyroxine  75 mcg Oral QAC breakfast  . lisinopril  5 mg Oral Daily  . metoprolol succinate  25 mg Oral Daily  . pantoprazole (PROTONIX) IV  40 mg Intravenous Q12H  . sertraline  100 mg Oral QPM   PRN Meds acetaminophen **OR** acetaminophen, hydrALAZINE, HYDROcodone-acetaminophen, nitroGLYCERIN, ondansetron **OR** ondansetron (ZOFRAN) IV, traMADol Results for orders placed or performed during the hospital encounter of 06/26/14 (from the past 48 hour(s))  CBC with Differential     Status: Abnormal   Collection Time: 06/26/14 12:55 PM  Result Value Ref Range   WBC 7.6 4.0 - 10.5 K/uL   RBC 3.10 (L) 3.87 - 5.11 MIL/uL   Hemoglobin 8.0 (L) 12.0 - 15.0 g/dL    Comment: REPEATED TO VERIFY   HCT 26.1 (L) 36.0 - 46.0 %   MCV 84.2 78.0 - 100.0 fL   MCH 25.8 (L) 26.0 - 34.0 pg   MCHC 30.7 30.0 - 36.0 g/dL   RDW 17.9 (H) 11.5 - 15.5 %   Platelets 289 150 - 400 K/uL    Comment: PLATELET COUNT CONFIRMED BY SMEAR   Neutrophils Relative % 67 43 - 77 %   Lymphocytes Relative 17 12 - 46 %   Monocytes Relative 15 (H) 3 - 12 %   Eosinophils Relative 1 0 - 5 %   Basophils Relative 0 0 - 1 %   Neutro Abs 5.1 1.7 - 7.7 K/uL   Lymphs Abs 1.3 0.7 - 4.0 K/uL   Monocytes Absolute 1.1 (H) 0.1 - 1.0 K/uL   Eosinophils Absolute 0.1 0.0 - 0.7 K/uL   Basophils Absolute 0.0 0.0 - 0.1 K/uL   RBC Morphology ELLIPTOCYTES     Comment: POLYCHROMASIA PRESENT  Comprehensive metabolic panel     Status: Abnormal   Collection Time: 06/26/14 12:55 PM  Result Value Ref Range    Sodium 139 137 - 147 mEq/L   Potassium 3.6 (L) 3.7 - 5.3 mEq/L   Chloride 101 96 - 112 mEq/L   CO2 24 19 - 32 mEq/L   Glucose, Bld 129 (H) 70 - 99 mg/dL   BUN 37 (H) 6 - 23 mg/dL   Creatinine, Ser 1.49 (H) 0.50 - 1.10 mg/dL   Calcium 8.4 8.4 - 10.5 mg/dL   Total Protein 6.3 6.0 - 8.3 g/dL   Albumin 2.9 (L) 3.5 - 5.2 g/dL   AST 170 (H) 0 - 37 U/L   ALT 138 (H) 0 - 35 U/L   Alkaline Phosphatase 84 39 - 117 U/L   Total Bilirubin 0.6 0.3 - 1.2 mg/dL   GFR calc non Af Amer 31 (L) >90 mL/min   GFR calc Af Amer 36 (L) >90 mL/min    Comment: (NOTE) The eGFR has been calculated using the CKD EPI equation. This calculation has not been validated in all clinical situations. eGFR's persistently <90 mL/min signify possible Chronic Kidney Disease.    Anion gap 14 5 - 15  BNP (order ONLY if patient complains of dyspnea/SOB AND you have documented it for THIS visit)     Status: Abnormal   Collection Time: 06/26/14  1:30 PM  Result Value Ref Range   Pro B Natriuretic peptide (BNP) 12174.0 (H) 0 - 450 pg/mL  Type and screen for Red Blood Exchange     Status: None   Collection Time: 06/26/14  1:30 PM  Result Value Ref Range   ABO/RH(D) A POS    Antibody Screen NEG    Sample Expiration 06/29/2014  Unit Number [id number]    Blood Component Type RED CELLS,LR    Unit division 00    Status of Unit ISSUED,FINAL    Transfusion Status OK TO TRANSFUSE    Crossmatch Result Compatible   POC occult blood, ED Provider will collect     Status: Abnormal   Collection Time: 06/26/14  1:41 PM  Result Value Ref Range   Fecal Occult Bld POSITIVE (A) NEGATIVE  I-stat troponin, ED (not at [facility])     Status: None   Collection Time: 06/26/14  1:47 PM  Result Value Ref Range   Troponin i, poc 0.02 0.00 - 0.08 ng/mL   Comment 3            Comment: Due to the release kinetics of cTnI, a negative result within the first hours of the onset of symptoms does not rule out myocardial infarction with certainty. If  myocardial infarction is still suspected, repeat the test at appropriate intervals.   Prepare RBC     Status: None   Collection Time: 06/26/14  6:00 PM  Result Value Ref Range   Order Confirmation ORDER PROCESSED BY BLOOD BANK   Vitamin B12     Status: Abnormal   Collection Time: 06/26/14  7:06 PM  Result Value Ref Range   Vitamin B-12 >2000 (H) 211 - 911 pg/mL    Comment: Performed at [facility]  Folate     Status: None   Collection Time: 06/26/14  7:06 PM  Result Value Ref Range   Folate >20.0 ng/mL    Comment: (NOTE) Reference Ranges        Deficient:       0.4 - 3.3 ng/mL        Indeterminate:   3.4 - 5.4 ng/mL        Normal:              > 5.4 ng/mL Performed at [facility]   Iron and TIBC     Status: Abnormal   Collection Time: 06/26/14  7:06 PM  Result Value Ref Range   Iron 16 (L) 42 - 135 ug/dL   TIBC 370 250 - 470 ug/dL   Saturation Ratios 4 (L) 20 - 55 %   UIBC 354 125 - 400 ug/dL    Comment: Performed at [facility]  Ferritin     Status: None   Collection Time: 06/26/14  7:06 PM  Result Value Ref Range   Ferritin 47 10 - 291 ng/mL    Comment: Performed at [facility]  Reticulocytes     Status: Abnormal   Collection Time: 06/26/14  7:06 PM  Result Value Ref Range   Retic Ct Pct 2.1 0.4 - 3.1 %   RBC. 3.34 (L) 3.87 - 5.11 MIL/uL   Retic Count, Manual 70.1 19.0 - 186.0 K/uL  Comprehensive metabolic panel     Status: Abnormal   Collection Time: 06/26/14  7:06 PM  Result Value Ref Range   Sodium 139 137 - 147 mEq/L   Potassium 4.7 3.7 - 5.3 mEq/L    Comment: NO VISIBLE HEMOLYSIS   Chloride 101 96 - 112 mEq/L   CO2 25 19 - 32 mEq/L   Glucose, Bld 244 (H) 70 - 99 mg/dL   BUN 36 (H) 6 - 23 mg/dL   Creatinine, Ser 1.61 (H) 0.50 - 1.10 mg/dL   Calcium 8.6 8.4 - 10.5 mg/dL   Total Protein 6.4 6.0 - 8.3 g/dL  Albumin 3.0 (L) 3.5 - 5.2 g/dL   AST 156 (H) 0 - 37 U/L   ALT 141 (H) 0 - 35 U/L   Alkaline Phosphatase 93 39 -  117 U/L   Total Bilirubin 0.5 0.3 - 1.2 mg/dL   GFR calc non Af Amer 28 (L) >90 mL/min   GFR calc Af Amer 33 (L) >90 mL/min    Comment: (NOTE) The eGFR has been calculated using the CKD EPI equation. This calculation has not been validated in all clinical situations. eGFR's persistently <90 mL/min signify possible Chronic Kidney Disease.    Anion gap 13 5 - 15  Hemoglobin A1c     Status: Abnormal   Collection Time: 06/26/14  7:06 PM  Result Value Ref Range   Hgb A1c MFr Bld 7.5 (H) <5.7 %    Comment: (NOTE)                                                                       According to the ADA Clinical Practice Recommendations for 2011, when HbA1c is used as a screening test:  >=6.5%   Diagnostic of Diabetes Mellitus           (if abnormal result is confirmed) 5.7-6.4%   Increased risk of developing Diabetes Mellitus References:Diagnosis and Classification of Diabetes Mellitus,Diabetes YPPJ,0932,67(TIWPY 1):S62-S69 and Standards of Medical Care in         Diabetes - 2011,Diabetes Care,2011,34 (Suppl 1):S11-S61.    Mean Plasma Glucose 169 (H) <117 mg/dL    Comment: Performed at [facility]  Glucose, capillary     Status: Abnormal   Collection Time: 06/26/14  8:57 PM  Result Value Ref Range   Glucose-Capillary 232 (H) 70 - 99 mg/dL  CBC     Status: Abnormal   Collection Time: 06/27/14  4:36 AM  Result Value Ref Range   WBC 8.7 4.0 - 10.5 K/uL   RBC 3.63 (L) 3.87 - 5.11 MIL/uL   Hemoglobin 9.6 (L) 12.0 - 15.0 g/dL   HCT 30.7 (L) 36.0 - 46.0 %   MCV 84.6 78.0 - 100.0 fL   MCH 26.4 26.0 - 34.0 pg   MCHC 31.3 30.0 - 36.0 g/dL   RDW 17.4 (H) 11.5 - 15.5 %   Platelets 253 150 - 400 K/uL  Glucose, capillary     Status: Abnormal   Collection Time: 06/27/14  5:49 AM  Result Value Ref Range   Glucose-Capillary 171 (H) 70 - 99 mg/dL   Comment 1 Notify RN     Dg Chest 2 View  06/26/2014   CLINICAL DATA:  Shortness of breath, chest pain  EXAM: CHEST  2 VIEW   COMPARISON:  04/29/2014  FINDINGS: New moderate-sized right and small left pleural effusions are noted, obscuring the lung bases. Heart size is at upper limits of normal. Bilateral hilar prominence with central tapering may suggest pulmonary arterial hypertension. Leftward curvature of the spine centered at T12 is again noted. Cholecystectomy clips are noted. A few interstitial Kerley B-lines are noted.  IMPRESSION: New moderate right and small left pleural effusions obscuring the lung bases. This could obscure underlying pneumonia and/or atelectasis. Peripheral interstitial Kerley B-lines could indicate superimposed early interstitial edema.   Electronically Signed  By: Conchita Paris M.D.   On: 06/26/2014 14:04              Blood pressure 178/72, pulse 58, temperature 97.2 F (36.2 C), temperature source Oral, resp. rate 18, height _0  (1.473 m), weight 67.5 kg (148 lb 13 oz), SpO2 99 %.  Physical exam:   General--elderly frail white female in hospital bed with 02 by nasal cannula. Neck: supple with no lymphadenopathy Heart--regular rate and rhythm without murmurs are gallops Lungs--clear Abdomen--none distended with good bowel sounds soft. There is mild epigastric tenderness.   Assessment: 1. Anemia/heme positive stools. This could be due to many different things. The issue will be how aggressive to be given her overall medical conditions. With the epigastric discomfort it may be that an EGD would be reasonable 2. Epigastric abdominal pain. This appears to be a recent onset in the patient adamantly denies use of any NSAIDs. She has been on protonix for an undetermined period of time. It may be reasonable to do an EGD based on old brother health problems. 3. CAD with stents history of MI etc. 4. COPD on chronic 02  Plan: Agree with going ahead and transfuse in the patient now. Would have cardiology see her. It felt to be reasonable I would probably go ahead with EGD is 1st step  given her epigastric discomfort. We definitely should keep her empirically on PPI therapy   Briget Shaheed JR,Ty Buntrock L 06/27/2014, 9:25 AM

## 2014-06-30 NOTE — Progress Notes (Signed)
UR completed Vennie Waymire K. Daveon Arpino, RN, BSN, MSHL, CCM  06/30/2014 11:53 AM

## 2014-06-30 NOTE — Progress Notes (Signed)
[facility] Progress Note   Alissah Redmon Blackwell AYT:[id number] DOB: 1927/11/12 DOA: 06/26/2014 PCP: [name], MD  Brief narrative: Dana Blackwell is a 80 y.o. female with PMH of CAD, recent NSTEMI s/p PTCA of the LAD and subsequent DES stent to left circumflex in 9/15, DM, HTN, Dyslipidemia, COPD/Pulm HTN on home O2, chronic diastolic CHF, presents to the ER with weakness and dyspnea. She is found to be anemic with heme positive stools, significant epigastric pain and fluid overloaded. We have been diuresing her with IV Lasix. She underwent an EGD today which does not show any evidence for GI bleed.   Subjective: No longer having abdominal pain. Tolerating diet. No shortness of breath.  Assessment/Plan: Principal Problem:  Anemia/normocytic  -trace heme positive stools -anemia panel reveals low Iron levels and low normal Ferretin -reports colonoscopy >10-15 years ago, cannot recall provider, EUS per Dr.Outlaw in 2011 -transfused 1 unit PRBC bringing Hb up from 8 to 9.6 -[facility] consulted- due to significant epigastric pain, GI recommended EGD which was performed today and is negative for a bleeding source - pain improved significantly on PPI BID  Acute on chronic diastolic CHF - cont IV lasix today 40mg  q12- cardiology assisting with management - follow I/Os, weights- she is in negative balance by almost 8 L now- weight has improved from 151to133 pounds - last ECHO with EF 50-55% in 9/15 - continue Metoprolol and low dose ACE  CKD 3 - stable- follow with diuresis  Elevated LFTs -denies any ETOH use -normal LFTs in 2011, none checked since, so recent baseline unknown -stopped statin -abdominal ultrasound reveals coarse liver parenchymal echogenicity -could be due to passive hepatic congestion from CHF -repeat LFTs reveal improvement   CAD, recent NSTEMI and PTCI of LAD and DES to circumflex -cont  ASA/ - OK to hold plavix per cardiology for now until  cause of bleeding is found- awaiting recommendations as far as resumption of this - cont metoprolol -hold statin   Diabetes mellitus -continue lantus, increase SSI to moderate as sugars uncontrolled in the afternoon and evening - A1c 7.5  Essential hypertension -cotninue ACE, imdur and metoprolol  COPD (chronic obstructive pulmonary disease)/ Pulmonary HTN -stable     Code Status: Full code Family Communication:  Disposition Plan: to be determined- PT eval still pending DVT prophylaxis: SCDs  Consultants: GI Cardiology  Procedures: none  Antibiotics: Anti-infectives    None      Objective: Filed Weights   06/28/14 0427 06/29/14 0642 06/30/14 0504  Weight: 65.6 kg (144 lb 10 oz) 62 kg (136 lb 11 oz) 60.737 kg (133 lb 14.4 oz)    Intake/Output Summary (Last 24 hours) at 06/30/14 1049 Last data filed at 06/30/14 0500  Gross per 24 hour  Intake    440 ml  Output   2000 ml  Net  -1560 ml     Vitals Filed Vitals:   06/30/14 0905 06/30/14 0907 06/30/14 1005 06/30/14 1006  BP: 135/52 149/44 144/50 147/45  Pulse: 64 65  68  Temp:    97.9 F (36.6 C)  TempSrc:    Oral  Resp: 17 17    Height:      Weight:      SpO2: 100% 100%  98%    Exam: General: No acute respiratory distress- AAO x 3  Lungs: coarse crackles in right lower lobe today Cardiovascular: Regular rate and rhythm without murmur gallop or rub normal S1 and S2 Abdomen: less tender in epigastrium, nondistended, soft,  bowel sounds positive, no rebound, no ascites, no appreciable mass Extremities: No significant cyanosis, clubbing, or edema bilateral lower extremities  Data Reviewed: Basic Metabolic Panel:  Recent Labs Lab 06/26/14 1255 06/26/14 1906 06/28/14 0505 06/29/14 0400  NA 139 139 145 144  K 3.6* 4.7 3.8 3.5*  CL 101 101 103 99  CO2 24 25 29  36*  GLUCOSE 129* 244* 110* 138*  BUN 37* 36* 30* 28*  CREATININE 1.49* 1.61* 1.57* 1.20*  CALCIUM 8.4 8.6 8.8 8.7   Liver Function  Tests:  Recent Labs Lab 06/26/14 1255 06/26/14 1906 06/29/14 0400  AST 170* 156* 48*  ALT 138* 141* 69*  ALKPHOS 84 93 80  BILITOT 0.6 0.5 0.8  PROT 6.3 6.4 6.0  ALBUMIN 2.9* 3.0* 2.7*   No results for input(s): LIPASE, AMYLASE in the last 168 hours. No results for input(s): AMMONIA in the last 168 hours. CBC:  Recent Labs Lab 06/26/14 1255 06/27/14 0436 06/28/14 0505 06/29/14 0400 06/30/14 0715  WBC 7.6 8.7 8.5 8.2 7.9  NEUTROABS 5.1  --   --   --   --   HGB 8.0* 9.6* 9.7* 9.8* 10.5*  HCT 26.1* 30.7* 31.4* 31.5* 34.5*  MCV 84.2 84.6 84.6 84.9 86.0  PLT 289 253 280 262 264   Cardiac Enzymes: No results for input(s): CKTOTAL, CKMB, CKMBINDEX, TROPONINI in the last 168 hours. BNP (last 3 results)  Recent Labs  04/13/14 1131 05/25/14 1550 06/26/14 1330  PROBNP 351.0* 684.0* 12174.0*   CBG:  Recent Labs Lab 06/29/14 0614 06/29/14 1114 06/29/14 1637 06/29/14 2122 06/30/14 0626  GLUCAP 113* 251* 110* 233* 177*    No results found for this or any previous visit (from the past 240 hour(s)).   Studies:  Recent x-ray studies have been reviewed in detail by the Attending Physician  Scheduled Meds:  Scheduled Meds: . amLODipine  5 mg Oral Daily  . aspirin EC  81 mg Oral Daily  . cloNIDine  0.1 mg Oral BID  . furosemide  40 mg Intravenous BID  . insulin aspart  0-15 Units Subcutaneous TID WC  . insulin aspart  0-5 Units Subcutaneous QHS  . insulin glargine  10 Units Subcutaneous QHS  . isosorbide mononitrate  60 mg Oral Daily  . levothyroxine  75 mcg Oral QAC breakfast  . metoprolol succinate  25 mg Oral Daily  . pantoprazole (PROTONIX) IV  40 mg Intravenous Q12H  . polyethylene glycol  17 g Oral QID  . sertraline  100 mg Oral QPM   Continuous Infusions:   Time spent on care of this patient: 94 min   [place], MD 06/30/2014, 10:49 AM  LOS: 4 days   [facility] Office  [phone] Pager - Text Page per [email or web address]  If 7PM-7AM,  please contact night-coverage [email or web address]

## 2014-06-30 NOTE — Progress Notes (Signed)
Rectal exam performed during EGD after patient was sedated. She has been complaining of no bowel movement for 4 days. Stool was hard but was not impacted. The stools brown. There are no test stopped on the endoscopy unit to check stool for FOB so this cannot be done.

## 2014-06-30 NOTE — Interval H&P Note (Signed)
History and Physical Interval Note:  06/30/2014 8:26 AM  Dana Blackwell  has presented today for surgery, with the diagnosis of eg pain  The various methods of treatment have been discussed with the patient and family. After consideration of risks, benefits and other options for treatment, the patient has consented to  Procedure(s): ESOPHAGOGASTRODUODENOSCOPY (EGD) (N/A) as a surgical intervention .  The patient's history has been reviewed, patient examined, no change in status, stable for surgery.  I have reviewed the patient's chart and labs.  Questions were answered to the patient's satisfaction.     Raffi Milstein JR,Yamina Lenis L

## 2014-06-30 NOTE — Evaluation (Signed)
Physical Therapy Evaluation Patient Details Name: Dana Blackwell MRN: [id number] DOB: 03/10/1928 Today's Date: 06/30/2014   History of Present Illness   Pt adm with anemia. EGD negative. PMH of CAD, recent NSTEMI s/p PTCA of the LAD and subsequent DES stent to left circumflex in 9/15, DM, HTN, Dyslipidemia, COPD/Pulm HTN on home O2, chronic diastolic CHF  Clinical Impression  Pt doing well with mobility and no further PT needed.  Ready for dc from PT standpoint.      Follow Up Recommendations No PT follow up    Equipment Recommendations  None recommended by PT    Recommendations for Other Services       Precautions / Restrictions Precautions Precautions: Fall      Mobility  Bed Mobility                  Transfers Overall transfer level: Modified independent Equipment used: 4-wheeled walker                Ambulation/Gait Ambulation/Gait assistance: Modified independent (Device/Increase time) Ambulation Distance (Feet): 400 Feet Assistive device: 4-wheeled walker Gait Pattern/deviations: Step-through pattern;Decreased stride length   Gait velocity interpretation: Below normal speed for age/gender General Gait Details: Steady gait with rollator  Stairs            Wheelchair Mobility    Modified Rankin (Stroke Patients Only)       Balance Overall balance assessment: Needs assistance Sitting-balance support: No upper extremity supported;Feet supported Sitting balance-Leahy Scale: Good     Standing balance support: No upper extremity supported Standing balance-Leahy Scale: Good                               Pertinent Vitals/Pain Pain Assessment: No/denies pain    Home Living Family/patient expects to be discharged to:: Private residence Living Arrangements: Alone   Type of Home: Apartment Home Access: Stairs to enter Entrance Stairs-Rails: None Entrance Stairs-Number of Steps: 1 Home Layout: One level Home  Equipment: Walker - 4 wheels;Shower seat      Prior Function Level of Independence: Independent with assistive device(s)         Comments: Amb with rollator and uses home O2.     Hand Dominance   Dominant Hand: Right    Extremity/Trunk Assessment   Upper Extremity Assessment: Overall WFL for tasks assessed           Lower Extremity Assessment: Overall WFL for tasks assessed         Communication   Communication: HOH  Cognition Arousal/Alertness: Awake/alert Behavior During Therapy: WFL for tasks assessed/performed Overall Cognitive Status: Within Functional Limits for tasks assessed                      General Comments      Exercises        Assessment/Plan    PT Assessment Patent does not need any further PT services  PT Diagnosis Difficulty walking   PT Problem List    PT Treatment Interventions     PT Goals (Current goals can be found in the Care Plan section) Acute Rehab PT Goals PT Goal Formulation: All assessment and education complete, DC therapy    Frequency     Barriers to discharge        Co-evaluation               End of Session Equipment Utilized During Treatment: Oxygen  Activity Tolerance: Patient tolerated treatment well Patient left: in chair;with call bell/phone within reach;with chair alarm set Nurse Communication: Mobility status         Time: 1410-1448 PT Time Calculation (min) (ACUTE ONLY): 38 min   Charges:   PT Evaluation $Initial PT Evaluation Tier I: 1 Procedure PT Treatments $Gait Training: 23-37 mins   PT G Codes:          Farheen Pfahler 07/19/14, 3:02 PM  [employer] PT [phone]

## 2014-06-30 NOTE — Plan of Care (Signed)
Problem: Phase II Progression Outcomes Goal: Tolerating diet Outcome: Completed/Met Date Met:  06/30/14     

## 2014-06-30 NOTE — Progress Notes (Signed)
Pt back from Endo.  A&0x4.  Introduced self.  Call bell at reach.  Instructed to call for help.  Will continue to monitor.  Karie Kirks, Therapist, sports.

## 2014-06-30 NOTE — Progress Notes (Signed)
Inpatient Diabetes Program Recommendations  AACE/ADA: New Consensus Statement on Inpatient Glycemic Control (2013)  Target Ranges:  Prepandial:   less than 140 mg/dL      Peak postprandial:   less than 180 mg/dL (1-2 hours)      Critically ill patients:  140 - 180 mg/dL   Results for EDDA, OREA (MRN [id number]) as of 06/30/2014 09:51  Ref. Range 06/29/2014 06:14 06/29/2014 11:14 06/29/2014 16:37 06/29/2014 21:22 06/30/2014 06:26  Glucose-Capillary Latest Range: 70-99 mg/dL 113 (H) 251 (H) 110 (H) 233 (H) 177 (H)     Diabetes history: Diabetes Mellitus Outpatient Diabetes medications: Lantus 10 units daily, Humalog 4-5 units tid with meals  Current orders for Inpatient glycemic control:  Lantus 10 units daily, Novolog moderate tid with meals and HS  The fluctuations in the blood sugars are likely as a result of the Novolog correction scale- she appears to need a more consistent amount of mealtime insulin- May consider adding Novolog meal coverage 3 units tid with meals (Hold if patient eats less than 50% or NPO).   Gentry Fitz, RN, BA, MHA, CDE Diabetes Coordinator Inpatient Diabetes Program  [phone] (Team Pager) [phone] [facility]) 06/30/2014 9:53 AM

## 2014-06-30 NOTE — Progress Notes (Signed)
     SUBJECTIVE: No chest pain or SOB.    BP 147/45 mmHg  Pulse 68  Temp(Src) 97.9 F (36.6 C) (Oral)  Resp 17  Ht 4\' 10"  (1.473 m)  Wt 133 lb 14.4 oz (60.737 kg)  BMI 27.99 kg/m2  SpO2 98%  Intake/Output Summary (Last 24 hours) at 06/30/14 1154 Last data filed at 06/30/14 0500  Gross per 24 hour  Intake    440 ml  Output   2000 ml  Net  -1560 ml    PHYSICAL EXAM General: Well developed, well nourished, in no acute distress. Alert and oriented x 3.  Psych:  Good affect, responds appropriately Neck: No JVD. No masses noted.  Lungs: Clear bilaterally with no wheezes or rhonci noted.  Heart: RRR with no murmurs noted. Abdomen: Bowel sounds are present. Soft, non-tender.  Extremities: No lower extremity edema.   LABS: Basic Metabolic Panel:  Recent Labs  06/28/14 0505 06/29/14 0400  NA 145 144  K 3.8 3.5*  CL 103 99  CO2 29 36*  GLUCOSE 110* 138*  BUN 30* 28*  CREATININE 1.57* 1.20*  CALCIUM 8.8 8.7   CBC:  Recent Labs  06/29/14 0400 06/30/14 0715  WBC 8.2 7.9  HGB 9.8* 10.5*  HCT 31.5* 34.5*  MCV 84.9 86.0  PLT 262 264   Current Meds: . amLODipine  5 mg Oral Daily  . aspirin EC  81 mg Oral Daily  . cloNIDine  0.1 mg Oral BID  . furosemide  40 mg Intravenous BID  . insulin aspart  0-15 Units Subcutaneous TID WC  . insulin aspart  0-5 Units Subcutaneous QHS  . insulin glargine  10 Units Subcutaneous QHS  . isosorbide mononitrate  60 mg Oral Daily  . levothyroxine  75 mcg Oral QAC breakfast  . metoprolol succinate  25 mg Oral Daily  . pantoprazole (PROTONIX) IV  40 mg Intravenous Q12H  . polyethylene glycol  17 g Oral QID  . sertraline  100 mg Oral QPM     ASSESSMENT AND PLAN:  1. GIB: Anemia and heme + stools in the setting of DAPT for recently placed DES to CFX 04/2014. Plavix currently on hold but still on low dose ASA. EGD today with no GI bleeding source. Hgb improved.    2. CAD: s/p recent NSTEMI 04/2014 with PCI +DES to CFX. Currently on  low dose ASA. I would restart Plavix as soon as it is felt safe by GI. Continue beta blocker and nitrate.   3. Acute on Chronic Diastolic CHF: EF 51-70% on recent echo 04/2014. She has diuresed well. -8.0 L since admission. Breathing improved.  Seems to be near baseline. Can convert back to PO diuretic today. Continue beta blocker.   MCALHANY,CHRISTOPHER  11/10/201511:54 AM

## 2014-07-01 ENCOUNTER — Encounter (HOSPITAL_COMMUNITY): Payer: Self-pay | Admitting: Gastroenterology

## 2014-07-01 DIAGNOSIS — D509 Iron deficiency anemia, unspecified: Secondary | ICD-10-CM | POA: Insufficient documentation

## 2014-07-01 LAB — IRON AND TIBC
Iron: 29 ug/dL — ABNORMAL LOW (ref 42–135)
Saturation Ratios: 8 % — ABNORMAL LOW (ref 20–55)
TIBC: 343 ug/dL (ref 250–470)
UIBC: 314 ug/dL (ref 125–400)

## 2014-07-01 LAB — GLUCOSE, CAPILLARY
GLUCOSE-CAPILLARY: 217 mg/dL — AB (ref 70–99)
Glucose-Capillary: 181 mg/dL — ABNORMAL HIGH (ref 70–99)
Glucose-Capillary: 201 mg/dL — ABNORMAL HIGH (ref 70–99)
Glucose-Capillary: 85 mg/dL (ref 70–99)

## 2014-07-01 LAB — CBC WITH DIFFERENTIAL/PLATELET
Basophils Absolute: 0 10*3/uL (ref 0.0–0.1)
Basophils Relative: 0 % (ref 0–1)
Eosinophils Absolute: 0.5 10*3/uL (ref 0.0–0.7)
Eosinophils Relative: 7 % — ABNORMAL HIGH (ref 0–5)
HCT: 33.5 % — ABNORMAL LOW (ref 36.0–46.0)
HEMOGLOBIN: 10.2 g/dL — AB (ref 12.0–15.0)
LYMPHS ABS: 1.9 10*3/uL (ref 0.7–4.0)
Lymphocytes Relative: 26 % (ref 12–46)
MCH: 26.4 pg (ref 26.0–34.0)
MCHC: 30.4 g/dL (ref 30.0–36.0)
MCV: 86.6 fL (ref 78.0–100.0)
MONOS PCT: 17 % — AB (ref 3–12)
Monocytes Absolute: 1.3 10*3/uL — ABNORMAL HIGH (ref 0.1–1.0)
NEUTROS ABS: 3.7 10*3/uL (ref 1.7–7.7)
NEUTROS PCT: 50 % (ref 43–77)
Platelets: 261 10*3/uL (ref 150–400)
RBC: 3.87 MIL/uL (ref 3.87–5.11)
RDW: 17.1 % — ABNORMAL HIGH (ref 11.5–15.5)
WBC: 7.4 10*3/uL (ref 4.0–10.5)

## 2014-07-01 NOTE — Progress Notes (Signed)
Inpatient Diabetes Program Recommendations  AACE/ADA: New Consensus Statement on Inpatient Glycemic Control (2013)  Target Ranges:  Prepandial:   less than 140 mg/dL      Peak postprandial:   less than 180 mg/dL (1-2 hours)      Critically ill patients:  140 - 180 mg/dL   Results for Dana Blackwell, Dana Blackwell (MRN [id number]) as of 07/01/2014 08:08  Ref. Range 06/30/2014 10:51 06/30/2014 15:31 06/30/2014 17:06 06/30/2014 21:32 07/01/2014 06:49  Glucose-Capillary Latest Range: 70-99 mg/dL 89 323 (H) 285 (H) 175 (H) 181 (H)    Diabetes history: Diabetes Mellitus Outpatient Diabetes medications: Lantus 10 units daily, Humalog 4-5 units tid with meals  Current orders for Inpatient glycemic control:  Lantus 10 units daily, Novolog moderate tid with meals and HS  The fluctuations in the blood sugars are likely as a result of the Novolog correction scale- she appears to need a more consistent amount of mealtime insulin- May consider adding Novolog meal coverage 3 units tid with meals (Hold if patient eats less than 50% or NPO).    Gentry Fitz, RN, BA, MHA, CDE Diabetes Coordinator Inpatient Diabetes Program  [phone] (Team Pager) [phone] [facility]) 07/01/2014 8:09 AM

## 2014-07-01 NOTE — Progress Notes (Signed)
Patient Name: Dana Blackwell Date of Encounter: 07/01/2014  Principal Problem:   Anemia Active Problems:   Diabetes mellitus   Essential hypertension   COPD (chronic obstructive pulmonary disease)   Pulmonary HTN   CAD in native artery   Acute on chronic diastolic congestive heart failure   GI bleed   Absolute anemia   CKD (chronic kidney disease), stage III   Congestive heart disease   Primary Cardiologist: Dr  Burt Knack  Patient Profile: 80 yo female w/ hx HOH, SAH, DM, HTN, Dyslipidemia, COPD/Pulm HTN on home O2, chronic diastolic CHF, NSTEMI w/ DES CFX (d/c 09/12), was admitted 11/07 w/ anemia, presumed GIB. Cards following for CHF and anticoagulation mgt.  SUBJECTIVE: Breathing better, has not been out of bed much.   OBJECTIVE Filed Vitals:   06/30/14 1006 06/30/14 1514 06/30/14 2031 07/01/14 0539  BP: 147/45 143/53 136/56 141/55  Pulse: 68 63 67 70  Temp: 97.9 F (36.6 C) 97.9 F (36.6 C) 98.2 F (36.8 C) 98.5 F (36.9 C)  TempSrc: Oral Oral Oral Oral  Resp:   18 18  Height:      Weight:    135 lb 9.3 oz (61.5 kg)  SpO2: 98% 98% 94% 98%    Intake/Output Summary (Last 24 hours) at 07/01/14 0827 Last data filed at 07/01/14 0539  Gross per 24 hour  Intake    840 ml  Output    900 ml  Net    -60 ml   Filed Weights   06/29/14 9678 06/30/14 0504 07/01/14 0539  Weight: 136 lb 11 oz (62 kg) 133 lb 14.4 oz (60.737 kg) 135 lb 9.3 oz (61.5 kg)   PHYSICAL EXAM General: Well developed, well nourished, female in no acute distress. Head: Normocephalic, atraumatic.  Neck: Supple without bruits, JVD minimal elevation. Lungs:  Resp regular and unlabored, rales bases. Heart: RRR, S1, S2, no S3, S4, soft murmur; no rub. Abdomen: Soft, non-tender, non-distended, BS + x 4.  Extremities: No clubbing, cyanosis, no edema.  Neuro: Alert and oriented X 3. Moves all extremities spontaneously. Psych: Normal affect.  LABS: CBC: Recent Labs  06/30/14 0715  07/01/14 0409  WBC 7.9 7.4  NEUTROABS  --  3.7  HGB 10.5* 10.2*  HCT 34.5* 33.5*  MCV 86.0 86.6  PLT 264 938   Basic Metabolic Panel: Recent Labs  06/29/14 0400  NA 144  K 3.5*  CL 99  CO2 36*  GLUCOSE 138*  BUN 28*  CREATININE 1.20*  CALCIUM 8.7   Liver Function Tests: Recent Labs  06/29/14 0400  AST 48*  ALT 69*  ALKPHOS 80  BILITOT 0.8  PROT 6.0  ALBUMIN 2.7*   Lab Results  Component Value Date   IRON 16* 06/26/2014   TIBC 370 06/26/2014   FERRITIN 47 06/26/2014   BNP: PRO B NATRIURETIC PEPTIDE (BNP)  Date/Time Value Ref Range Status  06/26/2014 01:30 PM 12174.0* 0 - 450 pg/mL Final  05/25/2014 03:50 PM 684.0* 0.0 - 100.0 pg/mL Final   TELE: SR, no significant ectopy   Current Medications:  . amLODipine  5 mg Oral Daily  . aspirin EC  81 mg Oral Daily  . cloNIDine  0.1 mg Oral BID  . furosemide  40 mg Oral Daily  . insulin aspart  0-15 Units Subcutaneous TID WC  . insulin aspart  0-5 Units Subcutaneous QHS  . insulin glargine  10 Units Subcutaneous QHS  . isosorbide mononitrate  60 mg Oral Daily  .  levothyroxine  75 mcg Oral QAC breakfast  . metoprolol succinate  25 mg Oral Daily  . pantoprazole (PROTONIX) IV  40 mg Intravenous Q12H  . polyethylene glycol  17 g Oral QID  . sertraline  100 mg Oral QPM      ASSESSMENT AND PLAN: 1. GIB: Anemia (H&H 8.0/26.1 on admission) and heme + stools in the setting of DAPT for recently placed DES to CFX 04/2014. Plavix currently on hold but still on low dose ASA. EGD 11/10 with no GI bleeding source. Hgb improved with transfusion and holding steady.+ Iron deficient.  2. CAD: s/p recent NSTEMI 04/2014 with PCI +DES to CFX. Currently on low dose ASA. I would restart Plavix as soon as it is felt safe by GI. Continue beta blocker and nitrate.   3. Acute on Chronic Diastolic CHF: EF 04-88% on recent echo 04/2014. She has diuresed well. -8.0 L since admission. Breathing improved. She thinks her dry weight is in the  140s, so she is probably there now.   Seems to be near baseline. Back on PO diuretic 11/10. Continue beta blocker.   Otherwise, per IM Principal Problem:   Anemia Active Problems:   Diabetes mellitus   Essential hypertension   COPD (chronic obstructive pulmonary disease)   Pulmonary HTN   CAD in native artery   Acute on chronic diastolic congestive heart failure   GI bleed   Absolute anemia   CKD (chronic kidney disease), stage III   Congestive heart disease   Signed, Rosaria Ferries , PA-C 8:27 AM 07/01/2014  I have personally seen and examined this patient with Rosaria Ferries, PA-C. I agree with the assessment and plan as outlined above. She has diuresed well. At baseline volume status. Still on ASA and Plavix is on hold with recent anemia/GI bleeding. I would recommend restarting Plavix 75 mg daily once ok with GI. I would ask GI to comment on the timing of this. She is stable from a cardiac standpoint. We will sign off. Please call with questions.   MCALHANY,CHRISTOPHER 07/01/2014 11:39 AM

## 2014-07-01 NOTE — Progress Notes (Signed)
[facility] PROGRESS NOTE  Dana Blackwell SMO:[id number] DOB: 06-Apr-1928 DOA: 06/26/2014 PCP: [name], MD  Assessment/Plan: 1. Normocytic anemia 1. Hgb remaining stable overnight 2. GI is following and pt is s/p unremarkable EGD 3. Discussed with GI. Given co-morbidities, pt would be a poor colonoscopy candidate 4. Recs for barium enema 2. Acute on chronic diastolic CHF 1. Cardiology had been following 2. Pt nearly to baseline wt and Cardiology has since signed off 3. CKD 3 1. Cr stable 4. Elevated LFT's 1. LFT's trending down 5. CAD 1. Stable 2. Monitor 6. DM 1. BS stable 2. On SSI and lantus 10 units daily 7. HTN 1. BP stable 8. COPD 1. On 2LNC o2 support 2. Stable 9. DVT prophylaxis 1. SCD's  Code Status: Full Family Communication: Pt in room Disposition Plan: Pending   Consultants:  Cardiology  GI  Procedures:  EGD  Antibiotics:    HPI/Subjective: Eager to go home. No acute events noted overnight  Objective: Filed Vitals:   06/30/14 1514 06/30/14 2031 07/01/14 0539 07/01/14 0954  BP: 143/53 136/56 141/55 171/85  Pulse: 63 67 70 72  Temp: 97.9 F (36.6 C) 98.2 F (36.8 C) 98.5 F (36.9 C)   TempSrc: Oral Oral Oral   Resp:  18 18   Height:      Weight:   61.5 kg (135 lb 9.3 oz)   SpO2: 98% 94% 98%     Intake/Output Summary (Last 24 hours) at 07/01/14 1424 Last data filed at 07/01/14 1241  Gross per 24 hour  Intake    880 ml  Output   1475 ml  Net   -595 ml   Filed Weights   06/29/14 0642 06/30/14 0504 07/01/14 0539  Weight: 62 kg (136 lb 11 oz) 60.737 kg (133 lb 14.4 oz) 61.5 kg (135 lb 9.3 oz)    Exam:   General:  Awake, in nad  Cardiovascular: regular, s1, s2  Respiratory: normal resp effort, no wheezing  Abdomen: soft,nondistended  Musculoskeletal: perfused, no clubbing   Data Reviewed: Basic Metabolic Panel:  Recent Labs Lab 06/26/14 1255 06/26/14 1906 06/28/14 0505 06/29/14 0400  NA 139 139  145 144  K 3.6* 4.7 3.8 3.5*  CL 101 101 103 99  CO2 24 25 29  36*  GLUCOSE 129* 244* 110* 138*  BUN 37* 36* 30* 28*  CREATININE 1.49* 1.61* 1.57* 1.20*  CALCIUM 8.4 8.6 8.8 8.7   Liver Function Tests:  Recent Labs Lab 06/26/14 1255 06/26/14 1906 06/29/14 0400  AST 170* 156* 48*  ALT 138* 141* 69*  ALKPHOS 84 93 80  BILITOT 0.6 0.5 0.8  PROT 6.3 6.4 6.0  ALBUMIN 2.9* 3.0* 2.7*   No results for input(s): LIPASE, AMYLASE in the last 168 hours. No results for input(s): AMMONIA in the last 168 hours. CBC:  Recent Labs Lab 06/26/14 1255 06/27/14 0436 06/28/14 0505 06/29/14 0400 06/30/14 0715 07/01/14 0409  WBC 7.6 8.7 8.5 8.2 7.9 7.4  NEUTROABS 5.1  --   --   --   --  3.7  HGB 8.0* 9.6* 9.7* 9.8* 10.5* 10.2*  HCT 26.1* 30.7* 31.4* 31.5* 34.5* 33.5*  MCV 84.2 84.6 84.6 84.9 86.0 86.6  PLT 289 253 280 262 264 261   Cardiac Enzymes: No results for input(s): CKTOTAL, CKMB, CKMBINDEX, TROPONINI in the last 168 hours. BNP (last 3 results)  Recent Labs  04/13/14 1131 05/25/14 1550 06/26/14 1330  PROBNP 351.0* 684.0* 12174.0*   CBG:  Recent Labs Lab 06/30/14  1531 06/30/14 1706 06/30/14 2132 07/01/14 0649 07/01/14 1129  GLUCAP 323* 285* 175* 181* 217*    No results found for this or any previous visit (from the past 240 hour(s)).   Studies: No results found.  Scheduled Meds: . amLODipine  5 mg Oral Daily  . aspirin EC  81 mg Oral Daily  . cloNIDine  0.1 mg Oral BID  . furosemide  40 mg Oral Daily  . insulin aspart  0-15 Units Subcutaneous TID WC  . insulin aspart  0-5 Units Subcutaneous QHS  . insulin glargine  10 Units Subcutaneous QHS  . isosorbide mononitrate  60 mg Oral Daily  . levothyroxine  75 mcg Oral QAC breakfast  . metoprolol succinate  25 mg Oral Daily  . pantoprazole (PROTONIX) IV  40 mg Intravenous Q12H  . polyethylene glycol  17 g Oral QID  . sertraline  100 mg Oral QPM   Continuous Infusions:   Principal Problem:    Anemia Active Problems:   Diabetes mellitus   Essential hypertension   COPD (chronic obstructive pulmonary disease)   Pulmonary HTN   CAD in native artery   Acute on chronic diastolic congestive heart failure   GI bleed   Absolute anemia   CKD (chronic kidney disease), stage III   Congestive heart disease  Time spent: 50min  CHIU, [facility] Pager [phone]. If 7PM-7AM, please contact night-coverage at [email or web address], password [employer] 07/01/2014, 2:24 PM  LOS: 5 days

## 2014-07-01 NOTE — Progress Notes (Signed)
Transitional Care Clinic:  This Case Manager reviewed patient's EMR and determined patient would benefit from chronic care management services through the [facility]. Patient has had 3 hospital admissions in the last six months. This Case Manager met with patient to discuss the services and medical management that can be provided at the [facility].  Patient declined post-discharge follow-up at the [facility] and indicated she does not have difficulty obtaining PCP appointment after discharge.  Patient provided with [facility] pamphlet and contact information in case she has questions or reconsiders.

## 2014-07-01 NOTE — Progress Notes (Signed)
[facility] PROGRESS NOTE Subjective Patients labs indicate iron deficiency anemia with iron saturation of 8. She tolerated EGD without any difficulty yesterday. She reports that she has been on oxygen now for about 9 or 10 months due to progressive congestive heart failure. It is been several years since she has had a colonoscopy at least about 10 years.cardiology recommended restarting Plavix if okay with G.I. The patient is not having any ongoing bleeding.  Objective: Vital signs in last 24 hours: Temp:  [97.9 F (36.6 C)-98.5 F (36.9 C)] 98.5 F (36.9 C) (11/11 0539) Pulse Rate:  [63-72] 72 (11/11 0954) Resp:  [18] 18 (11/11 0539) BP: (136-171)/(53-85) 171/85 mmHg (11/11 0954) SpO2:  [94 %-98 %] 98 % (11/11 0539) Weight:  [61.5 kg (135 lb 9.3 oz)] 61.5 kg (135 lb 9.3 oz) (11/11 0539) Last BM Date: 07/01/14  Intake/Output from previous day: 11/10 0701 - 11/11 0700 In: 840 [P.O.:840] Out: 900 [Urine:900] Intake/Output this shift: Total I/O In: 400 [P.O.:400] Out: 250 [Urine:250]  PE: General--patient in bed with oxygen no acute distress  Lungs--grossly clear Abdomen--soft and nontender  Lab Results:  Recent Labs  06/29/14 0400 06/30/14 0715 07/01/14 0409  WBC 8.2 7.9 7.4  HGB 9.8* 10.5* 10.2*  HCT 31.5* 34.5* 33.5*  PLT 262 264 261   BMET  Recent Labs  06/29/14 0400  NA 144  K 3.5*  CL 99  CO2 36*  CREATININE 1.20*   LFT  Recent Labs  06/29/14 0400  PROT 6.0  AST 48*  ALT 69*  ALKPHOS 80  BILITOT 0.8   PT/INR No results for input(s): LABPROT, INR in the last 72 hours. PANCREAS No results for input(s): LIPASE in the last 72 hours.       Studies/Results: No results found.  Medications: I have reviewed the patient's current medications.  Assessment/Plan: 1. Heme positive stool/iron deficiency anemia. Has been stable since Plavix on hold. EGD was negative. Issue at this point is whether or not to subject the patient to the  risk colonoscopy given her severe congestive heart failure/COPD requiring chronic 02 therapy. Should there be a complication of the procedure she may not survive surgery. I have discussed with her colonoscopy versus barium enema and I think at this point we should go ahead with barium enema and if no gross lesions are seen, go ahead and give her iron replacement therapy preferably some IV iron polyp in the hospital and discharge her on oral iron on a chronic basis.   Brexton Sofia JR,Lilymae Swiech L 07/01/2014, 12:28 PM

## 2014-07-02 ENCOUNTER — Inpatient Hospital Stay (HOSPITAL_COMMUNITY): Payer: Medicare HMO

## 2014-07-02 DIAGNOSIS — D62 Acute posthemorrhagic anemia: Secondary | ICD-10-CM

## 2014-07-02 LAB — GLUCOSE, CAPILLARY
Glucose-Capillary: 133 mg/dL — ABNORMAL HIGH (ref 70–99)
Glucose-Capillary: 126 mg/dL — ABNORMAL HIGH (ref 70–99)
Glucose-Capillary: 182 mg/dL — ABNORMAL HIGH (ref 70–99)
Glucose-Capillary: 188 mg/dL — ABNORMAL HIGH (ref 70–99)

## 2014-07-02 LAB — CBC
HCT: 37.3 % (ref 36.0–46.0)
Hemoglobin: 11.1 g/dL — ABNORMAL LOW (ref 12.0–15.0)
MCH: 26.5 pg (ref 26.0–34.0)
MCHC: 29.8 g/dL — ABNORMAL LOW (ref 30.0–36.0)
MCV: 89 fL (ref 78.0–100.0)
PLATELETS: 287 10*3/uL (ref 150–400)
RBC: 4.19 MIL/uL (ref 3.87–5.11)
RDW: 17 % — ABNORMAL HIGH (ref 11.5–15.5)
WBC: 7.1 10*3/uL (ref 4.0–10.5)

## 2014-07-02 NOTE — Progress Notes (Signed)
[facility] PROGRESS NOTE  Dana Blackwell WLS:[id number] DOB: 1927/12/22 DOA: 06/26/2014 PCP: Dana Gravel, MD  Assessment/Plan: 1. Normocytic anemia 1. Hgb cont to remain stable. 2. GI is following and pt is s/p unremarkable EGD 3. Given co-morbidities, pt would be a poor colonoscopy candidate 4. Recs for barium enema. Per pt, this appears to be scheduled for 11/13 2. Acute on chronic diastolic CHF 1. Cardiology had been following 2. Pt at around baseline wt and Cardiology has since signed off 3. CKD 3 1. Cr stable 4. Elevated LFT's 1. LFT's trending down 5. CAD 1. Stable 2. Monitor 6. DM 1. BS stable 2. On SSI and lantus 10 units daily 7. HTN 1. BP stable 8. COPD 1. On 2LNC o2 support 2. Stable 9. DVT prophylaxis 1. SCD's  Code Status: Full Family Communication: Pt in room Disposition Plan: Pending   Consultants:  Cardiology  GI  Procedures:  EGD  Antibiotics:    HPI/Subjective: No acute events noted overnight.   Objective: Filed Vitals:   07/01/14 2118 07/02/14 0614 07/02/14 1159 07/02/14 1347  BP: 147/63 165/63 160/66 114/40  Pulse: 66 73 67 63  Temp: 97.9 F (36.6 C) 98.1 F (36.7 C)  98.1 F (36.7 C)  TempSrc: Oral Oral  Oral  Resp: 18 17  16   Height:      Weight:  60.4 kg (133 lb 2.5 oz)    SpO2: 95% 98%  99%    Intake/Output Summary (Last 24 hours) at 07/02/14 1349 Last data filed at 07/02/14 1348  Gross per 24 hour  Intake   1655 ml  Output   1152 ml  Net    503 ml   Filed Weights   06/30/14 0504 07/01/14 0539 07/02/14 0614  Weight: 60.737 kg (133 lb 14.4 oz) 61.5 kg (135 lb 9.3 oz) 60.4 kg (133 lb 2.5 oz)    Exam:   General:  Awake, in nad  Cardiovascular: regular, s1, s2  Respiratory: normal resp effort, no wheezing  Abdomen: soft,nondistended  Musculoskeletal: perfused, no clubbing   Data Reviewed: Basic Metabolic Panel:  Recent Labs Lab 06/26/14 1255 06/26/14 1906 06/28/14 0505 06/29/14 0400   NA 139 139 145 144  K 3.6* 4.7 3.8 3.5*  CL 101 101 103 99  CO2 24 25 29  36*  GLUCOSE 129* 244* 110* 138*  BUN 37* 36* 30* 28*  CREATININE 1.49* 1.61* 1.57* 1.20*  CALCIUM 8.4 8.6 8.8 8.7   Liver Function Tests:  Recent Labs Lab 06/26/14 1255 06/26/14 1906 06/29/14 0400  AST 170* 156* 48*  ALT 138* 141* 69*  ALKPHOS 84 93 80  BILITOT 0.6 0.5 0.8  PROT 6.3 6.4 6.0  ALBUMIN 2.9* 3.0* 2.7*   No results for input(s): LIPASE, AMYLASE in the last 168 hours. No results for input(s): AMMONIA in the last 168 hours. CBC:  Recent Labs Lab 06/26/14 1255  06/28/14 0505 06/29/14 0400 06/30/14 0715 07/01/14 0409 07/02/14 0924  WBC 7.6  < > 8.5 8.2 7.9 7.4 7.1  NEUTROABS 5.1  --   --   --   --  3.7  --   HGB 8.0*  < > 9.7* 9.8* 10.5* 10.2* 11.1*  HCT 26.1*  < > 31.4* 31.5* 34.5* 33.5* 37.3  MCV 84.2  < > 84.6 84.9 86.0 86.6 89.0  PLT 289  < > 280 262 264 261 287  < > = values in this interval not displayed. Cardiac Enzymes: No results for input(s): CKTOTAL, CKMB, CKMBINDEX, TROPONINI in  the last 168 hours. BNP (last 3 results)  Recent Labs  04/13/14 1131 05/25/14 1550 06/26/14 1330  PROBNP 351.0* 684.0* 12174.0*   CBG:  Recent Labs Lab 07/01/14 1129 07/01/14 1635 07/01/14 2114 07/02/14 0605 07/02/14 1112  GLUCAP 217* 201* 85 133* 126*    No results found for this or any previous visit (from the past 240 hour(s)).   Studies: No results found.  Scheduled Meds: . amLODipine  5 mg Oral Daily  . aspirin EC  81 mg Oral Daily  . cloNIDine  0.1 mg Oral BID  . furosemide  40 mg Oral Daily  . insulin aspart  0-15 Units Subcutaneous TID WC  . insulin aspart  0-5 Units Subcutaneous QHS  . insulin glargine  10 Units Subcutaneous QHS  . isosorbide mononitrate  60 mg Oral Daily  . levothyroxine  75 mcg Oral QAC breakfast  . metoprolol succinate  25 mg Oral Daily  . pantoprazole (PROTONIX) IV  40 mg Intravenous Q12H  . polyethylene glycol  17 g Oral QID  .  sertraline  100 mg Oral QPM   Continuous Infusions:   Principal Problem:   Anemia Active Problems:   Diabetes mellitus   Essential hypertension   COPD (chronic obstructive pulmonary disease)   Pulmonary HTN   CAD in native artery   Acute on chronic diastolic congestive heart failure   GI bleed   Absolute anemia   CKD (chronic kidney disease), stage III   Congestive heart disease   Iron deficiency anemia  Time spent: 70min  [name], [place] [facility] Pager [phone]. If 7PM-7AM, please contact night-coverage at [email or web address], password [facility] 07/02/2014, 1:49 PM  LOS: 6 days

## 2014-07-02 NOTE — Progress Notes (Signed)
Inpatient Diabetes Program Recommendations  AACE/ADA: New Consensus Statement on Inpatient Glycemic Control (2013)  Target Ranges:  Prepandial:   less than 140 mg/dL      Peak postprandial:   less than 180 mg/dL (1-2 hours)      Critically ill patients:  140 - 180 mg/dL    Results for KAYDYNCE, PAT (MRN [id number]) as of 07/02/2014 08:31  Ref. Range 07/01/2014 06:49 07/01/2014 11:29 07/01/2014 16:35 07/01/2014 21:14 07/02/2014 06:05  Glucose-Capillary Latest Range: 70-99 mg/dL 181 (H) 217 (H) 201 (H) 85 133 (H)   Diabetes history: Diabetes Mellitus Outpatient Diabetes medications: Lantus 10 units daily, Humalog 4-5 units tid with meals  Current orders for Inpatient glycemic control:  Lantus 10 units daily, Novolog moderate tid with meals and HS  The fluctuations in the blood sugars are likely as a result of the Novolog correction scale- she appears to need a more consistent amount of mealtime insulin- May consider adding Novolog meal coverage 3 units tid with meals (Hold if patient eats less than 50% or NPO).   Gentry Fitz, RN, BA, MHA, CDE Diabetes Coordinator Inpatient Diabetes Program  [phone] (Team Pager) [phone] [facility]) 07/02/2014 8:32 AM

## 2014-07-02 NOTE — Progress Notes (Signed)
Patient evaluated for [place] [facility] on behalf of her [employer]. Met with patient at bedside to explain and offer services. Patient agreeable and consents obtained. She will receive post hospital discharge call and will be evaluated for monthly home visits. Explained that these services will not interfere or replace home health services. Will make inpatient RNCM aware [facility] to follow. Left [facility] Care Management packet and contact information at bedside. Patient appreciative of visit. Marthenia Rolling, MSN-RN,BSN- [facility] [phone]

## 2014-07-03 ENCOUNTER — Inpatient Hospital Stay (HOSPITAL_COMMUNITY): Payer: Medicare HMO

## 2014-07-03 LAB — BASIC METABOLIC PANEL
Anion gap: 9 (ref 5–15)
BUN: 17 mg/dL (ref 6–23)
CO2: 33 meq/L — AB (ref 19–32)
Calcium: 9.3 mg/dL (ref 8.4–10.5)
Chloride: 102 mEq/L (ref 96–112)
Creatinine, Ser: 1.1 mg/dL (ref 0.50–1.10)
GFR calc Af Amer: 52 mL/min — ABNORMAL LOW (ref >90)
GFR calc non Af Amer: 45 mL/min — ABNORMAL LOW (ref >90)
GLUCOSE: 128 mg/dL — AB (ref 70–99)
Potassium: 4.4 mEq/L (ref 3.7–5.3)
Sodium: 144 mEq/L (ref 137–147)

## 2014-07-03 LAB — GLUCOSE, CAPILLARY
GLUCOSE-CAPILLARY: 146 mg/dL — AB (ref 70–99)
GLUCOSE-CAPILLARY: 169 mg/dL — AB (ref 70–99)
Glucose-Capillary: 127 mg/dL — ABNORMAL HIGH (ref 70–99)
Glucose-Capillary: 153 mg/dL — ABNORMAL HIGH (ref 70–99)

## 2014-07-03 LAB — CBC
HEMATOCRIT: 35.4 % — AB (ref 36.0–46.0)
HEMOGLOBIN: 10.9 g/dL — AB (ref 12.0–15.0)
MCH: 26.8 pg (ref 26.0–34.0)
MCHC: 30.8 g/dL (ref 30.0–36.0)
MCV: 87 fL (ref 78.0–100.0)
Platelets: 293 10*3/uL (ref 150–400)
RBC: 4.07 MIL/uL (ref 3.87–5.11)
RDW: 16.7 % — ABNORMAL HIGH (ref 11.5–15.5)
WBC: 7 10*3/uL (ref 4.0–10.5)

## 2014-07-03 LAB — OCCULT BLOOD X 1 CARD TO LAB, STOOL: FECAL OCCULT BLD: NEGATIVE

## 2014-07-03 NOTE — Progress Notes (Signed)
[facility] PROGRESS NOTE  Abeeha Twist Myers-Wagoner FTD:[id number] DOB: Nov 25, 1927 DOA: 06/26/2014 PCP: [name], MD  Assessment/Plan: 1. Iron deficient anemia 1. Hgb remains stable thus far. 2. GI is following and pt is s/p unremarkable EGD 3. Given co-morbidities, pt would be a poor colonoscopy candidate 4. Recs for barium enema. Awaiting clear bowel prep prior to study 5. Discussed with GI 2. Acute on chronic diastolic CHF 1. Cardiology had been following 2. Pt at around baseline wt and Cardiology has since signed off 3. CKD 3 1. Cr stable 4. Elevated LFT's 1. LFT's trending down 5. CAD 1. Stable 2. Monitor 6. DM 1. BS stable 2. On SSI and lantus 10 units daily 7. HTN 1. BP stable 8. COPD 1. On 2LNC o2 support 2. Stable 9. DVT prophylaxis 1. SCD's  Code Status: Full Family Communication: Pt in room Disposition Plan: Pending   Consultants:  Cardiology  GI  Procedures:  EGD  Antibiotics:    HPI/Subjective: Pt is eager to go home. No acute events noted overnight  Objective: Filed Vitals:   07/03/14 0646 07/03/14 0820 07/03/14 0953 07/03/14 1300  BP: 162/56 127/44 149/51 95/42  Pulse: 64 55 63 58  Temp: 97.7 F (36.5 C) 97.8 F (36.6 C)  97.7 F (36.5 C)  TempSrc: Oral Oral  Oral  Resp: 18 20  18   Height:      Weight: 58.287 kg (128 lb 8 oz)     SpO2: 95% 99%  99%    Intake/Output Summary (Last 24 hours) at 07/03/14 1629 Last data filed at 07/03/14 1300  Gross per 24 hour  Intake   1060 ml  Output    550 ml  Net    510 ml   Filed Weights   07/01/14 0539 07/02/14 0614 07/03/14 0646  Weight: 61.5 kg (135 lb 9.3 oz) 60.4 kg (133 lb 2.5 oz) 58.287 kg (128 lb 8 oz)    Exam:   General:  Awake, in nad  Cardiovascular: regular, s1, s2  Respiratory: normal resp effort, no wheezing  Abdomen: soft,nondistended  Musculoskeletal: perfused, no clubbing   Data Reviewed: Basic Metabolic Panel:  Recent Labs Lab 06/26/14 1906  06/28/14 0505 06/29/14 0400 07/03/14 0351  NA 139 145 144 144  K 4.7 3.8 3.5* 4.4  CL 101 103 99 102  CO2 25 29 36* 33*  GLUCOSE 244* 110* 138* 128*  BUN 36* 30* 28* 17  CREATININE 1.61* 1.57* 1.20* 1.10  CALCIUM 8.6 8.8 8.7 9.3   Liver Function Tests:  Recent Labs Lab 06/26/14 1906 06/29/14 0400  AST 156* 48*  ALT 141* 69*  ALKPHOS 93 80  BILITOT 0.5 0.8  PROT 6.4 6.0  ALBUMIN 3.0* 2.7*   No results for input(s): LIPASE, AMYLASE in the last 168 hours. No results for input(s): AMMONIA in the last 168 hours. CBC:  Recent Labs Lab 06/29/14 0400 06/30/14 0715 07/01/14 0409 07/02/14 0924 07/03/14 0351  WBC 8.2 7.9 7.4 7.1 7.0  NEUTROABS  --   --  3.7  --   --   HGB 9.8* 10.5* 10.2* 11.1* 10.9*  HCT 31.5* 34.5* 33.5* 37.3 35.4*  MCV 84.9 86.0 86.6 89.0 87.0  PLT 262 264 261 287 293   Cardiac Enzymes: No results for input(s): CKTOTAL, CKMB, CKMBINDEX, TROPONINI in the last 168 hours. BNP (last 3 results)  Recent Labs  04/13/14 1131 05/25/14 1550 06/26/14 1330  PROBNP 351.0* 684.0* 12174.0*   CBG:  Recent Labs Lab 07/02/14 1112 07/02/14 1621  07/02/14 2124 07/03/14 0631 07/03/14 1124  GLUCAP 126* 182* 188* 127* 153*    No results found for this or any previous visit (from the past 240 hour(s)).   Studies: Dg Abd 1 View  07/03/2014   CLINICAL DATA:  Iron deficiency anemia.  EXAM: ABDOMEN - 1 VIEW  COMPARISON:  10/07/2009  FINDINGS: There has been good evacuation of the colon aside from the ascending colon which contains formed stool. This would preclude enema evaluation of the right colon. The bowel gas pattern is nonobstructive. There are cholecystectomy changes with neighboring calcification. Changes of bladder suspension.  IMPRESSION: Formed stool in the ascending colon. Please continue colon prep for barium enema.   Electronically Signed   By: Jorje Guild M.D.   On: 07/03/2014 09:31    Scheduled Meds: . amLODipine  5 mg Oral Daily  . aspirin  EC  81 mg Oral Daily  . cloNIDine  0.1 mg Oral BID  . furosemide  40 mg Oral Daily  . insulin aspart  0-15 Units Subcutaneous TID WC  . insulin aspart  0-5 Units Subcutaneous QHS  . insulin glargine  10 Units Subcutaneous QHS  . isosorbide mononitrate  60 mg Oral Daily  . levothyroxine  75 mcg Oral QAC breakfast  . metoprolol succinate  25 mg Oral Daily  . pantoprazole (PROTONIX) IV  40 mg Intravenous Q12H  . polyethylene glycol  17 g Oral QID  . sertraline  100 mg Oral QPM   Continuous Infusions:   Principal Problem:   Anemia Active Problems:   Diabetes mellitus   Essential hypertension   COPD (chronic obstructive pulmonary disease)   Pulmonary HTN   CAD in native artery   Acute on chronic diastolic congestive heart failure   GI bleed   Absolute anemia   CKD (chronic kidney disease), stage III   Congestive heart disease   Iron deficiency anemia  Time spent: 57min  Jordon Kristiansen, [place] [facility] Pager [phone]. If 7PM-7AM, please contact night-coverage at [email or web address], password [facility] 07/03/2014, 4:29 PM  LOS: 7 days

## 2014-07-03 NOTE — Plan of Care (Signed)
Problem: Phase I Progression Outcomes Goal: Dyspnea controlled at rest (HF) Outcome: Completed/Met Date Met:  07/03/14 Goal: Pain controlled with appropriate interventions Outcome: Completed/Met Date Met:  07/03/14  Problem: Phase II Progression Outcomes Goal: Pain controlled Outcome: Completed/Met Date Met:  07/03/14 Goal: Dyspnea controlled with activity Outcome: Completed/Met Date Met:  07/03/14 Goal: Fluid volume status improved Outcome: Completed/Met Date Met:  07/03/14 Goal: Begin discharge teaching Outcome: Progressing

## 2014-07-03 NOTE — Progress Notes (Signed)
[facility] PROGRESS NOTE Subjective BE was cancelled due to stool on KUB, now on miralax and clears reports that the stool is loose  Objective: Vital signs in last 24 hours: Temp:  [97.7 F (36.5 C)-98.4 F (36.9 C)] 97.7 F (36.5 C) (11/13 1300) Pulse Rate:  [55-65] 58 (11/13 1300) Resp:  [16-20] 18 (11/13 1300) BP: (95-164)/(40-68) 95/42 mmHg (11/13 1300) SpO2:  [95 %-99 %] 99 % (11/13 1300) Weight:  [58.287 kg (128 lb 8 oz)] 58.287 kg (128 lb 8 oz) (11/13 0646) Last BM Date: 07/02/14 (Patient states she had BM throughout the night )  Intake/Output from previous day: 11/12 0701 - 11/13 0700 In: 1640 [P.O.:1640] Out: 350 [Urine:350] Intake/Output this shift: Total I/O In: 360 [P.O.:360] Out: -     Lab Results:  Recent Labs  07/01/14 0409 07/02/14 0924 07/03/14 0351  WBC 7.4 7.1 7.0  HGB 10.2* 11.1* 10.9*  HCT 33.5* 37.3 35.4*  PLT 261 287 293   BMET  Recent Labs  07/03/14 0351  NA 144  K 4.4  CL 102  CO2 33*  CREATININE 1.10   LFT No results for input(s): PROT, AST, ALT, ALKPHOS, BILITOT, BILIDIR, IBILI in the last 72 hours. PT/INR No results for input(s): LABPROT, INR in the last 72 hours. PANCREAS No results for input(s): LIPASE in the last 72 hours.       Studies/Results: Dg Abd 1 View  07/03/2014   CLINICAL DATA:  Iron deficiency anemia.  EXAM: ABDOMEN - 1 VIEW  COMPARISON:  10/07/2009  FINDINGS: There has been good evacuation of the colon aside from the ascending colon which contains formed stool. This would preclude enema evaluation of the right colon. The bowel gas pattern is nonobstructive. There are cholecystectomy changes with neighboring calcification. Changes of bladder suspension.  IMPRESSION: Formed stool in the ascending colon. Please continue colon prep for barium enema.   Electronically Signed   By: Jorje Guild M.D.   On: 07/03/2014 09:31    Medications: I have reviewed the patient's current  medications.  Assessment/Plan: 1. + Stool. Pt to have BE due to risk of colon, but cancelled due to stool on miralax and clears will give tap water enemas and reschedule tomorrow.   Tory Septer JR,Chestina Komatsu L 07/03/2014, 1:44 PM

## 2014-07-04 LAB — GLUCOSE, CAPILLARY
GLUCOSE-CAPILLARY: 271 mg/dL — AB (ref 70–99)
Glucose-Capillary: 136 mg/dL — ABNORMAL HIGH (ref 70–99)

## 2014-07-04 LAB — CBC
HCT: 37.7 % (ref 36.0–46.0)
Hemoglobin: 11.4 g/dL — ABNORMAL LOW (ref 12.0–15.0)
MCH: 26.8 pg (ref 26.0–34.0)
MCHC: 30.2 g/dL (ref 30.0–36.0)
MCV: 88.7 fL (ref 78.0–100.0)
PLATELETS: 286 10*3/uL (ref 150–400)
RBC: 4.25 MIL/uL (ref 3.87–5.11)
RDW: 16.6 % — ABNORMAL HIGH (ref 11.5–15.5)
WBC: 6.8 10*3/uL (ref 4.0–10.5)

## 2014-07-04 MED ORDER — POLYVINYL ALCOHOL 1.4 % OP SOLN
2.0000 [drp] | OPHTHALMIC | Status: DC | PRN
  Filled 2014-07-04: qty 15

## 2014-07-04 MED ORDER — AMLODIPINE BESYLATE 5 MG PO TABS: 5.0000 mg | ORAL_TABLET | Freq: Every day | ORAL | Status: AC

## 2014-07-04 NOTE — Progress Notes (Signed)
Subjective: No further blood in stool.  Wants to go home.  Objective: Vital signs in last 24 hours: Temp:  [97.7 F (36.5 C)-98 F (36.7 C)] 98 F (36.7 C) (11/14 0445) Pulse Rate:  [58-67] 66 (11/14 1032) Resp:  [17-18] 18 (11/14 0445) BP: (95-184)/(42-66) 149/50 mmHg (11/14 1032) SpO2:  [93 %-99 %] 97 % (11/14 0445) Weight:  [59 kg (130 lb 1.1 oz)] 59 kg (130 lb 1.1 oz) (11/14 0445) Weight change: 0.713 kg (1 lb 9.1 oz) Last BM Date: 07/03/14  PE: GEN:  Elderly, frail-appearing, but is in NAD ABD:  Soft, non-tender  Lab Results: CBC    Component Value Date/Time   WBC 6.8 07/04/2014 0447   RBC 4.25 07/04/2014 0447   RBC 3.34* 06/26/2014 1906   HGB 11.4* 07/04/2014 0447   HCT 37.7 07/04/2014 0447   PLT 286 07/04/2014 0447   MCV 88.7 07/04/2014 0447   MCH 26.8 07/04/2014 0447   MCHC 30.2 07/04/2014 0447   RDW 16.6* 07/04/2014 0447   LYMPHSABS 1.9 07/01/2014 0409   MONOABS 1.3* 07/01/2014 0409   EOSABS 0.5 07/01/2014 0409   BASOSABS 0.0 07/01/2014 0409   CMP     Component Value Date/Time   NA 144 07/03/2014 0351   K 4.4 07/03/2014 0351   CL 102 07/03/2014 0351   CO2 33* 07/03/2014 0351   GLUCOSE 128* 07/03/2014 0351   BUN 17 07/03/2014 0351   CREATININE 1.10 07/03/2014 0351   CALCIUM 9.3 07/03/2014 0351   PROT 6.0 06/29/2014 0400   ALBUMIN 2.7* 06/29/2014 0400   AST 48* 06/29/2014 0400   ALT 69* 06/29/2014 0400   ALKPHOS 80 06/29/2014 0400   BILITOT 0.8 06/29/2014 0400   GFRNONAA 45* 07/03/2014 0351   GFRAA 52* 07/03/2014 0351   Studies/Results: Barium enema:  Few diverticula; no polyps or masses seen.  Assessment:  1.  Iron-deficiency anemia with dark stools.  No obvious source of endoscopy or barium enema.  Could have small bowel or colonic AVMs, which would not be apparent on the testing she's had, perhaps incited by clopidigrel.  Plan:  1.  OK to restart clopidigrel, if felt necessary by cardiology. 2.  If bleeding resumes, would have to  revisit the risks and benefits of clopidigrel.  As it currently stands, and as it will likely remain for the near future, patient is far too frail to undergo colonoscopy or any more type of therapeutic or definitive procedure for low-grade bleeding. 3.  OK to discharge home today from GI standpoint; will sign-off; please call with questions; thank you for the consult.   Landry Dyke 07/04/2014, 11:22 AM

## 2014-07-04 NOTE — Progress Notes (Signed)
Patient discharged to home. DC IV, DC tele, DC instructions given, patient verbalizes understanding.

## 2014-07-04 NOTE — Progress Notes (Signed)
The patient returned for her barium enema test and her diet was resumed per the orders in the [facility].

## 2014-07-04 NOTE — Discharge Summary (Signed)
Physician Discharge Summary  Dana Blackwell LKG:[id number] DOB: 12-12-1927 DOA: 06/26/2014  PCP: [name], MD  Admit date: 06/26/2014 Discharge date: 07/04/2014  Time spent: 35 minutes  Recommendations for Outpatient Follow-up:  1. Follow up with PCP in 1-2 weeks 2. Wt on d/c 59kg 3. Repeat CBC within 1-2 weeks  Discharge Diagnoses:  Principal Problem:   Anemia Active Problems:   Diabetes mellitus   Essential hypertension   COPD (chronic obstructive pulmonary disease)   Pulmonary HTN   CAD in native artery   Acute on chronic diastolic congestive heart failure   GI bleed   Absolute anemia   CKD (chronic kidney disease), stage III   Congestive heart disease   Iron deficiency anemia   Discharge Condition: Stable  Diet recommendation: Diabetic, heart healthy  Filed Weights   07/02/14 0614 07/03/14 0646 07/04/14 0445  Weight: 60.4 kg (133 lb 2.5 oz) 58.287 kg (128 lb 8 oz) 59 kg (130 lb 1.1 oz)    History of present illness:  Please see admit h and p from 11/6 for details. Briefly, pt presents with weakness and sob, found to have worsened anemia with heme pos stools while on plavix. The patient was admitted for further work up.  Hospital Course:  1. Iron deficient anemia 1. Hgb remained stable during this admit 2. GI had been following and pt is s/p unremarkable EGD 3. Given co-morbidities, pt is a poor colonoscopy candidate 4. Pt therefore underwent barium enema with findings of no gross polypoid lesion or obstruction lesion 5. Discussed with GI. Pt is cleared to resume plavix from GI standpoint.  2. Acute on chronic diastolic CHF 1. Cardiology had been following 2. Pt at around baseline wt and Cardiology has since signed off 3. CKD 3 1. Cr stable 4. Elevated LFT's 1. LFT's trending down 5. CAD 1. Stable 2. Monitor 6. DM 1. BS stable 2. On SSI and lantus 10 units daily 7. HTN 1. BP stable 8. COPD 1. On 2LNC o2 support 2. Stable 9. DVT  prophylaxis 1. SCD's  Procedures:  EGD  Barium enema  Consultations:  GI  Cardiology  Discharge Exam: Filed Vitals:   07/03/14 2051 07/03/14 2209 07/04/14 0445 07/04/14 1032  BP: 184/66 137/45 154/59 149/50  Pulse: 67  62 66  Temp: 97.7 F (36.5 C)  98 F (36.7 C)   TempSrc: Oral  Oral   Resp: 17  18   Height:      Weight:   59 kg (130 lb 1.1 oz)   SpO2: 93%  97%     General: Awake, in nad Cardiovascular: regular, s1, s2 Respiratory: normal resp effort, no wheezing  Discharge Instructions     Medication List    STOP taking these medications        lisinopril 5 MG tablet  Commonly known as:  PRINIVIL,ZESTRIL      TAKE these medications        acetaminophen 650 MG CR tablet  Commonly known as:  TYLENOL  Take 1,300 mg by mouth at bedtime.     amLODipine 5 MG tablet  Commonly known as:  NORVASC  Take 1 tablet (5 mg total) by mouth daily.     aspirin 81 MG tablet  Take 81 mg by mouth daily.     atorvastatin 20 MG tablet  Commonly known as:  LIPITOR  Take 1 tablet (20 mg total) by mouth daily at 6 PM.     beta carotene w/minerals tablet  Take 1  tablet by mouth at bedtime.     calcium carbonate 600 MG Tabs tablet  Commonly known as:  OS-CAL  Take 600 mg by mouth daily.     cloNIDine 0.1 MG tablet  Commonly known as:  CATAPRES  Take 1 tablet (0.1 mg total) by mouth 2 (two) times daily.     clopidogrel 75 MG tablet  Commonly known as:  PLAVIX  Take 1 tablet (75 mg total) by mouth daily.     fish oil-omega-3 fatty acids 1000 MG capsule  Take 1 g by mouth daily.     FOLTX 2.5-25-2 MG Tabs  Generic drug:  folic acid-pyridoxine-cyancobalamin  Take 1 tablet by mouth daily.     furosemide 40 MG tablet  Commonly known as:  LASIX  Take 1 tablet (40 mg total) by mouth daily.     HYDROcodone-acetaminophen 5-325 MG per tablet  Commonly known as:  NORCO/VICODIN  Take 1 tablet by mouth every 6 (six) hours as needed for moderate pain.     insulin  glargine 100 UNIT/ML injection  Commonly known as:  LANTUS  Inject 0.1 mLs (10 Units total) into the skin at bedtime.     insulin lispro 100 UNIT/ML injection  Commonly known as:  HUMALOG  Inject 4-5 Units into the skin 3 (three) times daily before meals.     isosorbide mononitrate 60 MG 24 hr tablet  Commonly known as:  IMDUR  Take 1 tablet (60 mg total) by mouth daily.     levothyroxine 75 MCG tablet  Commonly known as:  SYNTHROID, LEVOTHROID  Take 75 mcg by mouth daily.     MAGNESIUM PO  Take 1 tablet by mouth at bedtime.     metoprolol succinate 25 MG 24 hr tablet  Commonly known as:  TOPROL XL  Take 1 tablet (25 mg total) by mouth daily.     nitroGLYCERIN 0.4 MG SL tablet  Commonly known as:  NITROSTAT  Place 1 tablet (0.4 mg total) under the tongue every 5 (five) minutes as needed for chest pain.     Omega 3-6-9 Caps  Take 1 capsule by mouth daily.     pantoprazole 40 MG tablet  Commonly known as:  PROTONIX  Take 1 tablet (40 mg total) by mouth daily.     sertraline 100 MG tablet  Commonly known as:  ZOLOFT  Take 100 mg by mouth every evening.     STOOL SOFTENER PO  Take 1 tablet by mouth 2 (two) times daily as needed (constipation).     traMADol 50 MG tablet  Commonly known as:  ULTRAM  Take 1-2 tablets (50-100 mg total) by mouth every 6 (six) hours as needed (50mg  for mild pain, 75mg  for moderate pain, 100mg  for severe pain).     VITAMIN B-12 PO  Take 1 tablet by mouth daily.       Allergies  Allergen Reactions  . Tetanus Toxoid Other (See Comments)    unknown   Follow-up Information    Follow up with [name], MD. Schedule an appointment as soon as possible for a visit in 1 week.   Specialty:  Internal Medicine   Contact information:   [address] [place] [address] [phone]        The results of significant diagnostics from this hospitalization (including imaging, microbiology, ancillary and laboratory) are listed  below for reference.    Significant Diagnostic Studies: Dg Chest 2 View  06/26/2014   CLINICAL DATA:  Shortness of breath, chest pain  EXAM: CHEST  2 VIEW  COMPARISON:  04/29/2014  FINDINGS: New moderate-sized right and small left pleural effusions are noted, obscuring the lung bases. Heart size is at upper limits of normal. Bilateral hilar prominence with central tapering may suggest pulmonary arterial hypertension. Leftward curvature of the spine centered at T12 is again noted. Cholecystectomy clips are noted. A few interstitial Kerley B-lines are noted.  IMPRESSION: New moderate right and small left pleural effusions obscuring the lung bases. This could obscure underlying pneumonia and/or atelectasis. Peripheral interstitial Kerley B-lines could indicate superimposed early interstitial edema.   Electronically Signed   By: Conchita Paris M.D.   On: 06/26/2014 14:04   Dg Abd 1 View  07/03/2014   CLINICAL DATA:  Iron deficiency anemia.  EXAM: ABDOMEN - 1 VIEW  COMPARISON:  10/07/2009  FINDINGS: There has been good evacuation of the colon aside from the ascending colon which contains formed stool. This would preclude enema evaluation of the right colon. The bowel gas pattern is nonobstructive. There are cholecystectomy changes with neighboring calcification. Changes of bladder suspension.  IMPRESSION: Formed stool in the ascending colon. Please continue colon prep for barium enema.   Electronically Signed   By: Jorje Guild M.D.   On: 07/03/2014 09:31   Dg Colon W/cm - Wo/w Kub  07/03/2014   CLINICAL DATA:  Black positive stools  EXAM: SINGLE COLUMN BARIUM ENEMA  TECHNIQUE: Initial scout AP supine abdominal image obtained to insure adequate colon cleansing. Barium was introduced into the colon in a retrograde fashion and refluxed from the rectum to the cecum. Spot images of the colon followed by overhead radiographs were obtained.  FLUOROSCOPY TIME:  2 min and 58 seconds  COMPARISON:  None  FINDINGS:  The initial scout film of the abdomen shows scattered large and small bowel gas. The calcifications are noted in the right upper quadrant which are stable. Changes of prior cholecystectomy are noted. Degenerative changes of the lumbar spine are seen.  There is no obstruction the retrograde flow barium throughout the colon. No gross obstructing year constricting filling defects are seen. No gross polypoid filling defect is noted. Few scattered diverticula are noted. Mild distension of the ascending and transverse colon is noted.  IMPRESSION: No gross polypoid lesion or obstructing lesion is noted.  Minimal diverticular change.   Electronically Signed   By: Inez Catalina M.D.   On: 07/03/2014 19:31   US Abdomen Limited Ruq  06/27/2014   CLINICAL DATA:  Elevated LFTs, status post cholecystectomy  EXAM: US ABDOMEN LIMITED - RIGHT UPPER QUADRANT  COMPARISON:  L ultrasound dated 10/07/2009  FINDINGS: Gallbladder:  Surgically absent.  Common bile duct:  Diameter: Dilated, measuring 14 mm, unchanged from 2011.  Liver:  No focal lesion identified.  Coarse parenchymal echogenicity.  Additional comments: Mild perihepatic ascites. Right pleural effusion.  IMPRESSION: Status post cholecystectomy.  Common duct measures 14 mm, unchanged from 2011, likely postsurgical.  Mild perihepatic ascites.  Right pleural effusion.   Electronically Signed   By: Julian Hy M.D.   On: 06/27/2014 14:13    Microbiology: No results found for this or any previous visit (from the past 240 hour(s)).   Labs: Basic Metabolic Panel:  Recent Labs Lab 06/28/14 0505 06/29/14 0400 07/03/14 0351  NA 145 144 144  K 3.8 3.5* 4.4  CL 103 99 102  CO2 29 36* 33*  GLUCOSE 110* 138* 128*  BUN 30* 28* 17  CREATININE 1.57* 1.20* 1.10  CALCIUM 8.8 8.7 9.3   Liver Function Tests:  Recent Labs Lab 06/29/14 0400  AST 48*  ALT 69*  ALKPHOS 80  BILITOT 0.8  PROT 6.0  ALBUMIN 2.7*   No results for input(s): LIPASE, AMYLASE in the  last 168 hours. No results for input(s): AMMONIA in the last 168 hours. CBC:  Recent Labs Lab 06/30/14 0715 07/01/14 0409 07/02/14 0924 07/03/14 0351 07/04/14 0447  WBC 7.9 7.4 7.1 7.0 6.8  NEUTROABS  --  3.7  --   --   --   HGB 10.5* 10.2* 11.1* 10.9* 11.4*  HCT 34.5* 33.5* 37.3 35.4* 37.7  MCV 86.0 86.6 89.0 87.0 88.7  PLT 264 261 287 293 286   Cardiac Enzymes: No results for input(s): CKTOTAL, CKMB, CKMBINDEX, TROPONINI in the last 168 hours. BNP: BNP (last 3 results)  Recent Labs  04/13/14 1131 05/25/14 1550 06/26/14 1330  PROBNP 351.0* 684.0* 12174.0*   CBG:  Recent Labs Lab 07/03/14 1124 07/03/14 1619 07/03/14 2150 07/04/14 0647 07/04/14 1055  GLUCAP 153* 146* 169* 136* 271*   Signed:  Azul Coffie K  [facility] 07/04/2014, 12:09 PM

## 2014-07-30 ENCOUNTER — Encounter (HOSPITAL_COMMUNITY): Payer: Self-pay | Admitting: Cardiovascular Disease

## 2014-08-16 IMAGING — CR DG CHEST 1V PORT
1 series · 1 of 1 positions shown · non-contrast
Comparison: November 23, 2013 the mediastinal contour is normal.

CLINICAL DATA: The patient fainted today.

EXAM:
PORTABLE CHEST - 1 VIEW

[ap]
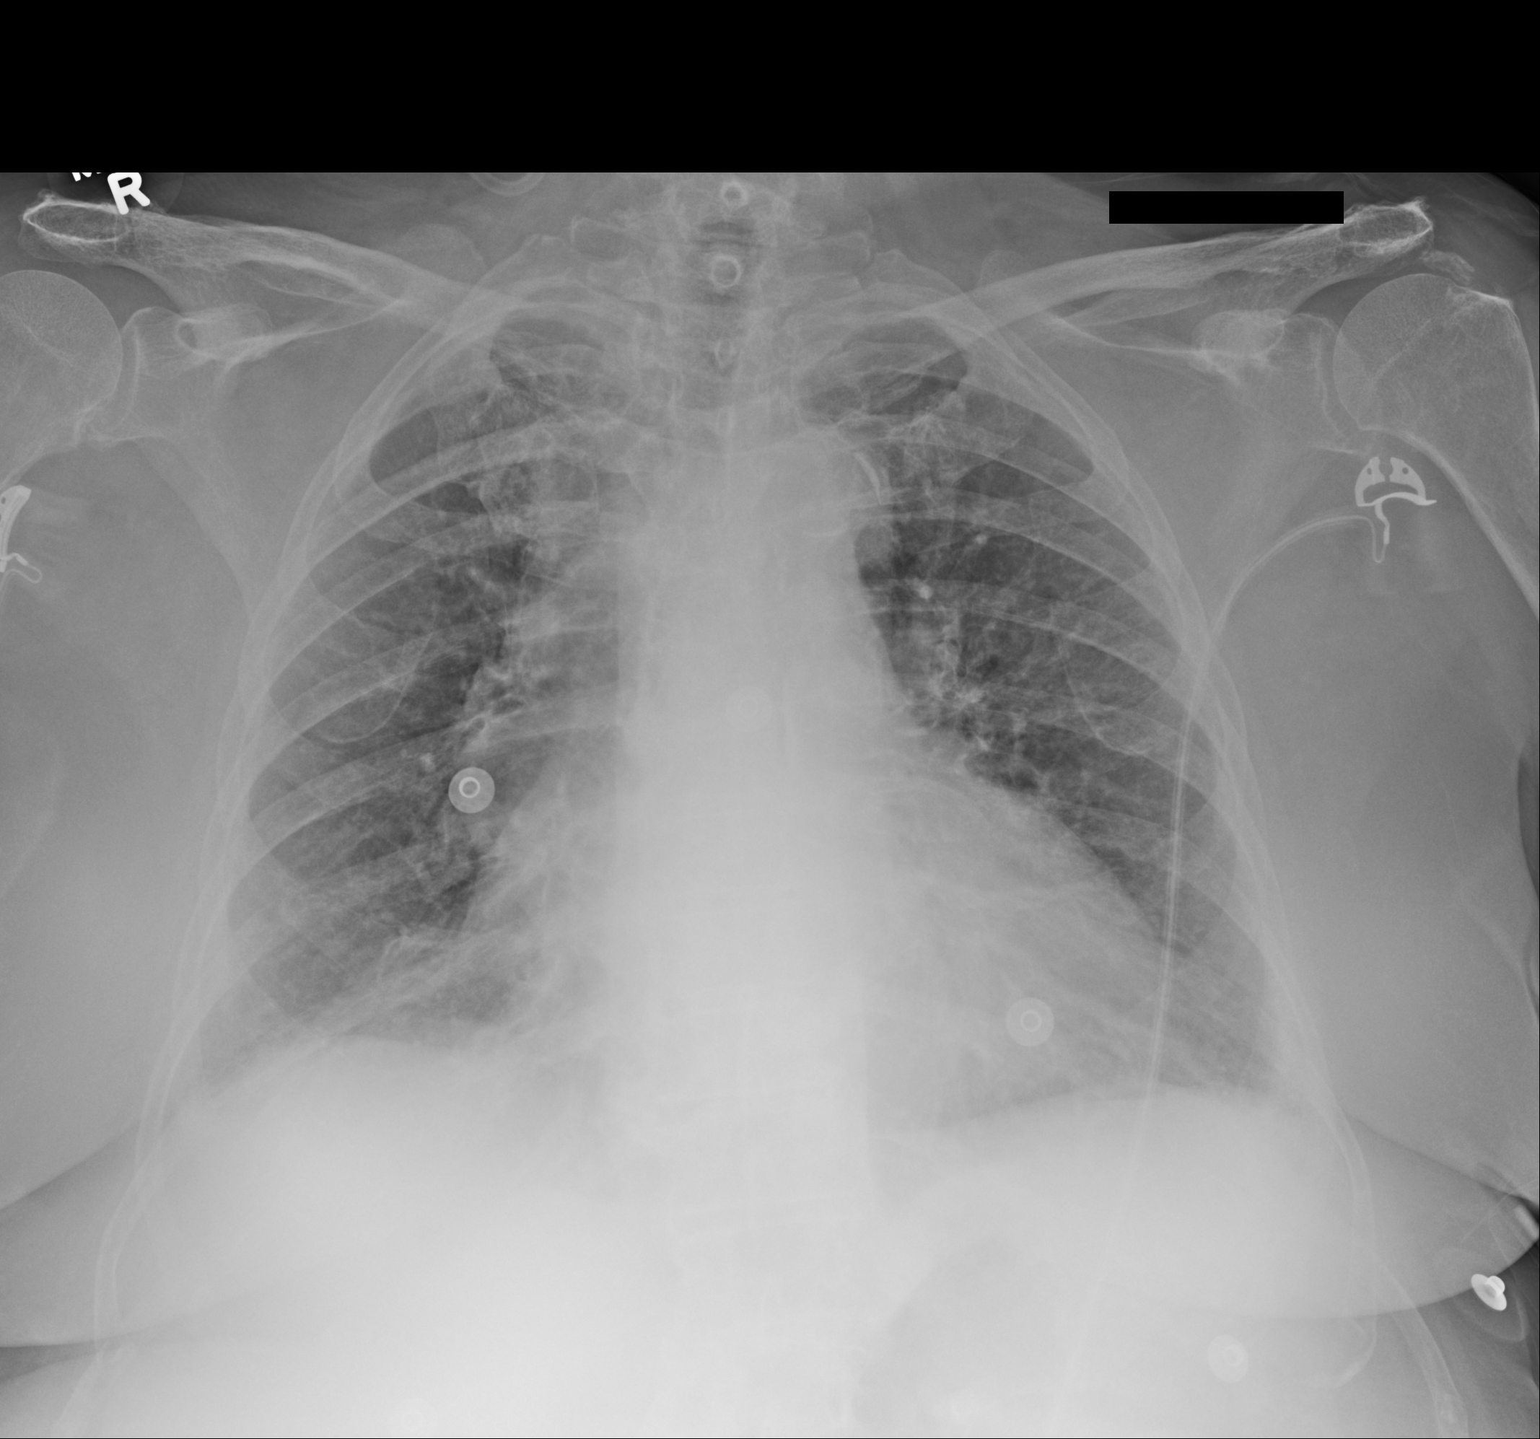

[1 of 1 positions shown; findings below may reference images not displayed]

The
heart size is enlarged. There is bilateral diffuse increased
pulmonary interstitium. Minimal bilateral pleural effusions are
identified. The visualized skeletal structures are stable.
IMPRESSION: Mild congestive heart failure.

## 2014-09-24 ENCOUNTER — Other Ambulatory Visit: Payer: Self-pay | Admitting: *Deleted

## 2014-09-28 ENCOUNTER — Other Ambulatory Visit: Payer: Self-pay | Admitting: *Deleted

## 2014-09-28 DIAGNOSIS — I10 Essential (primary) hypertension: Secondary | ICD-10-CM

## 2014-09-28 DIAGNOSIS — I2510 Atherosclerotic heart disease of native coronary artery without angina pectoris: Secondary | ICD-10-CM

## 2014-09-28 MED ORDER — CLONIDINE HCL 0.1 MG PO TABS: 0.1000 mg | ORAL_TABLET | Freq: Two times a day (BID) | ORAL | Status: AC

## 2014-09-28 MED ORDER — CLOPIDOGREL BISULFATE 75 MG PO TABS: 75.0000 mg | ORAL_TABLET | Freq: Every day | ORAL | Status: AC

## 2017-02-18 DEATH — deceased
